# Patient Record
Sex: Female | Born: 1985 | Race: Black or African American | Hispanic: No | Marital: Single | Smoking: Current every day smoker
Health system: Southern US, Community
[De-identification: ages and names within clinical notes are randomized; demographics above are authoritative.]

## PROBLEM LIST (undated history)

## (undated) ENCOUNTER — Inpatient Hospital Stay (HOSPITAL_COMMUNITY): Payer: Self-pay

## (undated) DIAGNOSIS — O24419 Gestational diabetes mellitus in pregnancy, unspecified control: Secondary | ICD-10-CM

## (undated) DIAGNOSIS — O139 Gestational [pregnancy-induced] hypertension without significant proteinuria, unspecified trimester: Secondary | ICD-10-CM

## (undated) DIAGNOSIS — A549 Gonococcal infection, unspecified: Secondary | ICD-10-CM

## (undated) DIAGNOSIS — F419 Anxiety disorder, unspecified: Secondary | ICD-10-CM

## (undated) DIAGNOSIS — I2699 Other pulmonary embolism without acute cor pulmonale: Secondary | ICD-10-CM

## (undated) DIAGNOSIS — F32A Depression, unspecified: Secondary | ICD-10-CM

## (undated) DIAGNOSIS — O0933 Supervision of pregnancy with insufficient antenatal care, third trimester: Secondary | ICD-10-CM

## (undated) DIAGNOSIS — F329 Major depressive disorder, single episode, unspecified: Secondary | ICD-10-CM

## (undated) DIAGNOSIS — A749 Chlamydial infection, unspecified: Secondary | ICD-10-CM

## (undated) HISTORY — PX: BIOPSY BREAST: PRO8

## (undated) HISTORY — PX: BREAST LUMPECTOMY: SHX2

## (undated) HISTORY — PX: WISDOM TOOTH EXTRACTION: SHX21

---
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
## 2003-02-13 ENCOUNTER — Emergency Department (HOSPITAL_COMMUNITY): Admission: EM | Admit: 2003-02-13 | Discharge: 2003-02-13 | Payer: Self-pay | Admitting: *Deleted

## 2003-03-06 ENCOUNTER — Emergency Department (HOSPITAL_COMMUNITY): Admission: EM | Admit: 2003-03-06 | Discharge: 2003-03-06 | Payer: Self-pay | Admitting: Emergency Medicine

## 2003-03-06 ENCOUNTER — Encounter: Payer: Self-pay | Admitting: Emergency Medicine

## 2004-02-08 ENCOUNTER — Emergency Department (HOSPITAL_COMMUNITY): Admission: EM | Admit: 2004-02-08 | Discharge: 2004-02-09 | Payer: Self-pay | Admitting: Emergency Medicine

## 2005-04-10 ENCOUNTER — Emergency Department (HOSPITAL_COMMUNITY): Admission: EM | Admit: 2005-04-10 | Discharge: 2005-04-10 | Payer: Self-pay | Admitting: Emergency Medicine

## 2005-04-12 ENCOUNTER — Emergency Department (HOSPITAL_COMMUNITY): Admission: EM | Admit: 2005-04-12 | Discharge: 2005-04-12 | Payer: Self-pay | Admitting: Emergency Medicine

## 2006-04-12 ENCOUNTER — Emergency Department (HOSPITAL_COMMUNITY): Admission: EM | Admit: 2006-04-12 | Discharge: 2006-04-12 | Payer: Self-pay | Admitting: Emergency Medicine

## 2008-02-02 ENCOUNTER — Emergency Department (HOSPITAL_COMMUNITY): Admission: EM | Admit: 2008-02-02 | Discharge: 2008-02-02 | Payer: Self-pay | Admitting: Emergency Medicine

## 2009-10-15 ENCOUNTER — Emergency Department (HOSPITAL_COMMUNITY): Admission: EM | Admit: 2009-10-15 | Discharge: 2009-10-15 | Payer: Self-pay | Admitting: Emergency Medicine

## 2009-12-12 ENCOUNTER — Inpatient Hospital Stay (HOSPITAL_COMMUNITY): Admission: AD | Admit: 2009-12-12 | Discharge: 2009-12-12 | Payer: Self-pay | Admitting: Obstetrics & Gynecology

## 2010-04-18 ENCOUNTER — Ambulatory Visit (HOSPITAL_COMMUNITY): Admission: RE | Admit: 2010-04-18 | Discharge: 2010-04-18 | Payer: Self-pay | Admitting: Obstetrics & Gynecology

## 2010-05-22 ENCOUNTER — Emergency Department (HOSPITAL_COMMUNITY): Admission: EM | Admit: 2010-05-22 | Discharge: 2010-05-22 | Payer: Self-pay | Admitting: Emergency Medicine

## 2010-07-01 ENCOUNTER — Ambulatory Visit: Payer: Self-pay | Admitting: Nurse Practitioner

## 2010-07-01 ENCOUNTER — Inpatient Hospital Stay (HOSPITAL_COMMUNITY)
Admission: AD | Admit: 2010-07-01 | Discharge: 2010-07-01 | Payer: Self-pay | Source: Home / Self Care | Admitting: Obstetrics and Gynecology

## 2010-08-20 ENCOUNTER — Ambulatory Visit: Payer: Self-pay | Admitting: Family Medicine

## 2010-08-20 ENCOUNTER — Inpatient Hospital Stay (HOSPITAL_COMMUNITY): Admission: AD | Admit: 2010-08-20 | Discharge: 2010-08-20 | Payer: Self-pay | Admitting: Obstetrics & Gynecology

## 2010-10-22 ENCOUNTER — Ambulatory Visit: Payer: Self-pay | Admitting: Advanced Practice Midwife

## 2010-10-22 ENCOUNTER — Inpatient Hospital Stay (HOSPITAL_COMMUNITY): Admission: AD | Admit: 2010-10-22 | Discharge: 2010-10-23 | Payer: Self-pay | Admitting: Obstetrics & Gynecology

## 2010-11-26 ENCOUNTER — Inpatient Hospital Stay (HOSPITAL_COMMUNITY)
Admission: AD | Admit: 2010-11-26 | Discharge: 2010-11-26 | Payer: Self-pay | Source: Home / Self Care | Admitting: Obstetrics and Gynecology

## 2010-11-26 ENCOUNTER — Encounter: Payer: Self-pay | Admitting: Obstetrics and Gynecology

## 2010-11-26 ENCOUNTER — Ambulatory Visit: Payer: Self-pay | Admitting: Obstetrics and Gynecology

## 2010-11-26 LAB — CONVERTED CEMR LAB: GC Probe Amp, Genital: NEGATIVE

## 2010-11-27 ENCOUNTER — Inpatient Hospital Stay (HOSPITAL_COMMUNITY)
Admission: AD | Admit: 2010-11-27 | Discharge: 2010-11-27 | Payer: Self-pay | Source: Home / Self Care | Admitting: Obstetrics and Gynecology

## 2010-11-27 ENCOUNTER — Inpatient Hospital Stay (HOSPITAL_COMMUNITY): Admission: AD | Admit: 2010-11-27 | Discharge: 2010-04-07 | Payer: Self-pay | Admitting: Family Medicine

## 2010-11-27 ENCOUNTER — Inpatient Hospital Stay (HOSPITAL_COMMUNITY): Admission: AD | Admit: 2010-11-27 | Discharge: 2010-04-09 | Payer: Self-pay | Admitting: Obstetrics & Gynecology

## 2010-12-03 ENCOUNTER — Ambulatory Visit: Payer: Self-pay | Admitting: Obstetrics and Gynecology

## 2010-12-03 ENCOUNTER — Encounter: Payer: Self-pay | Admitting: Obstetrics and Gynecology

## 2010-12-03 LAB — CONVERTED CEMR LAB
AST: 22 units/L (ref 0–37)
Albumin: 3.3 g/dL — ABNORMAL LOW (ref 3.5–5.2)
Calcium: 9.7 mg/dL (ref 8.4–10.5)
Chloride: 103 meq/L (ref 96–112)
Glucose, Bld: 77 mg/dL (ref 70–99)
HCT: 39.1 % (ref 36.0–46.0)
MCHC: 33.8 g/dL (ref 30.0–36.0)
Potassium: 4.5 meq/L (ref 3.5–5.3)
Sodium: 137 meq/L (ref 135–145)
Total Protein: 6.3 g/dL (ref 6.0–8.3)
Uric Acid, Serum: 6.1 mg/dL (ref 2.4–7.0)

## 2010-12-05 ENCOUNTER — Encounter (INDEPENDENT_AMBULATORY_CARE_PROVIDER_SITE_OTHER): Payer: Self-pay | Admitting: *Deleted

## 2010-12-05 LAB — CONVERTED CEMR LAB
Collection Interval-CRCL: 24 hr
Creatinine 24 HR UR: 1715 mg/24hr (ref 700–1800)
Creatinine Clearance: 192 mL/min — ABNORMAL HIGH (ref 75–115)
Protein, 24H Urine: 167 mg/24hr — ABNORMAL HIGH (ref 50–100)

## 2010-12-10 ENCOUNTER — Ambulatory Visit: Payer: Self-pay | Admitting: Obstetrics and Gynecology

## 2010-12-10 ENCOUNTER — Encounter: Payer: Self-pay | Admitting: Physician Assistant

## 2010-12-11 ENCOUNTER — Ambulatory Visit (HOSPITAL_COMMUNITY)
Admission: RE | Admit: 2010-12-11 | Discharge: 2010-12-11 | Payer: Self-pay | Source: Home / Self Care | Attending: Obstetrics & Gynecology | Admitting: Obstetrics & Gynecology

## 2010-12-11 ENCOUNTER — Ambulatory Visit: Payer: Self-pay | Admitting: Family Medicine

## 2010-12-12 ENCOUNTER — Ambulatory Visit: Payer: Self-pay | Admitting: Obstetrics and Gynecology

## 2010-12-12 ENCOUNTER — Inpatient Hospital Stay (HOSPITAL_COMMUNITY)
Admission: AD | Admit: 2010-12-12 | Discharge: 2010-12-15 | Payer: Self-pay | Source: Home / Self Care | Attending: Obstetrics & Gynecology | Admitting: Obstetrics & Gynecology

## 2011-01-12 NOTE — Discharge Summary (Addendum)
NAMEJODE, Anne Richardson           ACCOUNT NO.:  [id number]  MEDICAL RECORD NO.:  [id number]          PATIENT TYPE:  INP  LOCATION:  9105                          FACILITY:  [facility]  PHYSICIAN:  Scheryl Darter, MD       DATE OF BIRTH:  1986-12-07  DATE OF ADMISSION:  12/12/2010 DATE OF DISCHARGE:  12/15/2010                              DISCHARGE SUMMARY   ADMISSION DIAGNOSES: 1. Intrauterine pregnancy at 40-1/7 weeks. 2. Premature rupture of membranes at term. 3. Early labor.  DISCHARGE DIAGNOSES: 1. Intrauterine pregnancy at 40-1/7 weeks. 2. Premature rupture of membranes at term. 3. Early labor. 4. Chorioamnionitis. 5. Failure to progress and status post cesarean delivery of a viable     female infant, Apgars 9 and 9, weighing 7 pounds 11 ounces.  HOSPITAL PROCEDURES: 1. Electronic fetal monitoring. 2. Internal fetal monitoring. 3. Pitocin augmentation of labor. 4. Epidural anesthesia. 5. Primary low transverse cesarean section.  HOSPITAL COURSE:  The patient was admitted with premature rupture of membranes at term.  Cervix was 3 cm at admission.  She was managed expectantly and found to also be having some elevated blood pressures 130s to 170s over 70s and 80s.  Labs were done and were normal.  Pitocin augmentation was begun and she progressed to 5 cm but then stopped making progress after that.  She developed a fever with fetal tachycardia and diagnosed with chorioamnionitis.  Ampicillin and gentamicin were started and decision was made at that point to proceed with primary low transverse cesarean section by Dr. Marice Potter, which was performed under epidural anesthesia for a viable female infant, Apgars of 9 and 9, weighing 7 pounds 11 ounces.  EBL was 600 mL.  There were no complications.  She was taken to recovery and then to Mother-Baby Unit where she received routine care.  On postop day #1, she was doing well. She was afebrile.  Incision was clean, dry, and intact.  Pain  was appropriate.  She was passing gas and tolerating food and fluids and increased activity and was initially breast-feeding and then changed over to bottle-feeding when she had trouble breast-feeding.  On postop day #2, she was requesting early discharge.  She had decided to use oral contraceptives for birth control.  She was bottle-feeding her infant. Vital signs were stable.  She was afebrile for greater than 36 hours. Chest was clear.  Heart rate regular rate and rhythm.  Abdomen soft and appropriately tender.  Incision was clean, dry, and intact.  Lochia normal.  Extremities normal.  She was deemed to have received full benefit of her hospital stay and was discharged home.  DISCHARGE MEDICATIONS:  Motrin 600 mg p.o. q.6 h. p.r.n.pain, Percocet 1 to 2 p.o. q.4 h. p.r.n.severe pain, prenatal vitamin one p.o. daily, iron 325 mg p.o. daily, Colace 100 mg p.o. daily p.r.n., Ortho-Novum 1/35 one p.o. daily.  DISCHARGE LABS:  Urine was negative for protein and white blood cell count was 12.3, hemoglobin 13.5, platelets 255.  Urine drug screen was negative.  Uric acid was 6.7.  Chemistries were normal.  RPR was nonreactive.  DISCHARGE INSTRUCTIONS:  Per handout discharge followup in  6 [facility].  CONDITION ON DISCHARGE:  Good.     Anne Richardson, C.N.M.   ______________________________ Scheryl Darter, MD    MLW/MEDQ  D:  12/15/2010  T:  12/16/2010  Job:  [id number]  Electronically Signed by Wynelle Bourgeois C.N.M. on 01/10/2011 03:12:48 PM Electronically Signed by Scheryl Darter MD on 01/12/2011 11:44:12 AM

## 2011-01-15 ENCOUNTER — Ambulatory Visit: Admit: 2011-01-15 | Payer: Self-pay | Admitting: Family Medicine

## 2011-01-26 ENCOUNTER — Ambulatory Visit (INDEPENDENT_AMBULATORY_CARE_PROVIDER_SITE_OTHER): Payer: Medicaid Other | Admitting: Family Medicine

## 2011-01-26 ENCOUNTER — Other Ambulatory Visit: Payer: Self-pay | Admitting: Family Medicine

## 2011-01-26 DIAGNOSIS — F329 Major depressive disorder, single episode, unspecified: Secondary | ICD-10-CM

## 2011-02-16 ENCOUNTER — Ambulatory Visit: Payer: Medicaid Other | Admitting: Family Medicine

## 2011-03-02 LAB — CBC
HCT: 39.3 % (ref 36.0–46.0)
HCT: 39.8 % (ref 36.0–46.0)
MCHC: 34.2 g/dL (ref 30.0–36.0)
RBC: 4.44 MIL/uL (ref 3.87–5.11)
RBC: 4.45 MIL/uL (ref 3.87–5.11)
RDW: 13.9 % (ref 11.5–15.5)
RDW: 14.1 % (ref 11.5–15.5)
WBC: 11.8 10*3/uL — ABNORMAL HIGH (ref 4.0–10.5)
WBC: 12.3 10*3/uL — ABNORMAL HIGH (ref 4.0–10.5)

## 2011-03-02 LAB — POCT URINALYSIS DIPSTICK
Glucose, UA: NEGATIVE mg/dL
Ketones, ur: NEGATIVE mg/dL
Nitrite: NEGATIVE
pH: 5.5 (ref 5.0–8.0)

## 2011-03-02 LAB — COMPREHENSIVE METABOLIC PANEL
ALT: 15 U/L (ref 0–35)
Albumin: 3 g/dL — ABNORMAL LOW (ref 3.5–5.2)
Calcium: 9.6 mg/dL (ref 8.4–10.5)
Creatinine, Ser: 0.72 mg/dL (ref 0.4–1.2)
GFR calc non Af Amer: >60 mL/min (ref >60)
Glucose, Bld: 82 mg/dL (ref 70–99)
Sodium: 134 mEq/L — ABNORMAL LOW (ref 135–145)
Total Protein: 6.7 g/dL (ref 6.0–8.3)

## 2011-03-02 LAB — URINALYSIS, DIPSTICK ONLY
Glucose, UA: NEGATIVE mg/dL
Nitrite: NEGATIVE
Specific Gravity, Urine: 1.025 (ref 1.005–1.030)
pH: 6 (ref 5.0–8.0)

## 2011-03-02 LAB — GLUCOSE, CAPILLARY: Glucose-Capillary: 100 mg/dL — ABNORMAL HIGH (ref 70–99)

## 2011-03-02 LAB — URIC ACID: Uric Acid, Serum: 6.7 mg/dL (ref 2.4–7.0)

## 2011-03-02 LAB — RAPID URINE DRUG SCREEN, HOSP PERFORMED: Cocaine: NOT DETECTED

## 2011-03-03 LAB — COMPREHENSIVE METABOLIC PANEL
ALT: 18 U/L (ref 0–35)
AST: 25 U/L (ref 0–37)
Albumin: 2.7 g/dL — ABNORMAL LOW (ref 3.5–5.2)
Alkaline Phosphatase: 123 U/L — ABNORMAL HIGH (ref 39–117)
BUN: 6 mg/dL (ref 6–23)
CO2: 24 mEq/L (ref 19–32)
Chloride: 104 mEq/L (ref 96–112)
GFR calc Af Amer: >60 mL/min (ref >60)
Glucose, Bld: 158 mg/dL — ABNORMAL HIGH (ref 70–99)
Potassium: 4.1 mEq/L (ref 3.5–5.1)

## 2011-03-03 LAB — URINALYSIS, ROUTINE W REFLEX MICROSCOPIC
Glucose, UA: NEGATIVE mg/dL
Ketones, ur: 15 mg/dL — AB
Nitrite: NEGATIVE
Protein, ur: NEGATIVE mg/dL

## 2011-03-03 LAB — CBC
HCT: 39.1 % (ref 36.0–46.0)
Hemoglobin: 13 g/dL (ref 12.0–15.0)
RBC: 4.18 MIL/uL (ref 3.87–5.11)
RDW: 14.3 % (ref 11.5–15.5)
WBC: 9.8 10*3/uL (ref 4.0–10.5)

## 2011-03-03 LAB — POCT URINALYSIS DIPSTICK
Bilirubin Urine: NEGATIVE
Glucose, UA: NEGATIVE mg/dL
Protein, ur: 30 mg/dL — AB
Urobilinogen, UA: 0.2 mg/dL (ref 0.0–1.0)
pH: 5.5 (ref 5.0–8.0)

## 2011-03-03 LAB — PROTEIN, URINE, 24 HOUR: Protein, Urine: 8 mg/dL

## 2011-03-03 LAB — WET PREP, GENITAL

## 2011-03-03 LAB — CREATININE, URINE, 24 HOUR: Collection Interval-UCRE24: 24 hours

## 2011-03-03 LAB — HIV ANTIBODY (ROUTINE TESTING W REFLEX): HIV: NONREACTIVE

## 2011-03-03 LAB — PROTEIN / CREATININE RATIO, URINE: Protein Creatinine Ratio: 0.19 — ABNORMAL HIGH (ref 0.00–0.15)

## 2011-03-06 LAB — DIFFERENTIAL
Basophils Relative: 0 % (ref 0–1)
Lymphocytes Relative: 16 % (ref 12–46)
Monocytes Absolute: 1.2 10*3/uL — ABNORMAL HIGH (ref 0.1–1.0)
Monocytes Relative: 11 % (ref 3–12)
Neutro Abs: 8.1 10*3/uL — ABNORMAL HIGH (ref 1.7–7.7)

## 2011-03-06 LAB — URINALYSIS, ROUTINE W REFLEX MICROSCOPIC
Glucose, UA: NEGATIVE mg/dL
Nitrite: NEGATIVE
Protein, ur: NEGATIVE mg/dL

## 2011-03-06 LAB — URINE MICROSCOPIC-ADD ON

## 2011-03-06 LAB — WET PREP, GENITAL

## 2011-03-06 LAB — RUBELLA SCREEN: Rubella: 56.5 IU/mL — ABNORMAL HIGH

## 2011-03-06 LAB — RPR: RPR Ser Ql: NONREACTIVE

## 2011-03-06 LAB — TYPE AND SCREEN
ABO/RH(D): B POS
Antibody Screen: NEGATIVE

## 2011-03-06 LAB — CBC
HCT: 36.8 % (ref 36.0–46.0)
Hemoglobin: 12.5 g/dL (ref 12.0–15.0)
MCHC: 34.1 g/dL (ref 30.0–36.0)

## 2011-03-10 LAB — HCG, QUANTITATIVE, PREGNANCY: hCG, Beta Chain, Quant, S: 3036 m[IU]/mL — ABNORMAL HIGH (ref <5)

## 2011-03-10 LAB — WET PREP, GENITAL: Yeast Wet Prep HPF POC: NONE SEEN

## 2011-03-10 LAB — URINALYSIS, ROUTINE W REFLEX MICROSCOPIC
Bilirubin Urine: NEGATIVE
Glucose, UA: NEGATIVE mg/dL
Hgb urine dipstick: NEGATIVE
Ketones, ur: NEGATIVE mg/dL
pH: 6 (ref 5.0–8.0)

## 2011-03-10 LAB — CBC
Platelets: 274 10*3/uL (ref 150–400)
RDW: 12.6 % (ref 11.5–15.5)

## 2011-03-10 LAB — GC/CHLAMYDIA PROBE AMP, GENITAL: Chlamydia, DNA Probe: NEGATIVE

## 2011-03-13 NOTE — Progress Notes (Signed)
Anne Richardson, Anne Richardson           ACCOUNT NO.:  [id number]  MEDICAL RECORD NO.:  [id number]           PATIENT TYPE:  A  LOCATION:  [facility]                   FACILITY:  [facility]  PHYSICIAN:  Maryelizabeth Kaufmann, MD  DATE OF BIRTH:  [birth year]  DATE OF SERVICE:  01/26/2011                                 CLINIC NOTE  CHIEF COMPLAINT:  Postpartum check.  HISTORY OF PRESENT ILLNESS:  This is a 25 year old gravida 2, para 1-0-1- 1 status post primary low transverse cesarean section for failure to progress presenting for her postpartum evaluation.  She states she does have some mild incisional pain on the right; otherwise, she denies any problems with her incision.  No opening.  She denies any fevers, no chills, no nausea, no vomiting.  She is currently bottle feeding.  She is currently on OCPs and condoms for birth control.  She has returned to her sexual activity; however, she does state she is having difficulty remembering to take her OCPs regularly, so she is considering other forms of contraception including the IUD, which she is unsure if she wants to do that at the time.  She denies any constipation.  Her last menstrual period is as of January 04, 2011.  She does have some mild spotting as of currently.  Additionally of note, she declines any signs or symptoms of depression; however, upon further questioning, her postpartum screening tool was positive with a score of 16.  She denies any suicidal ideation.  She denies any inclination to hurt the baby; however, she does state that she have thoughts at times of hurting the baby's daddy and she state that she sometimes desires to "stab him." She denies any history of previous depression.  PHYSICAL EXAMINATION:  VITAL SIGNS:  Blood pressure 122/79, pulse 88, temperature 96.9, weight 257.4, height 66 inches. GENERAL:  The patient in no acute distress, alert and oriented x4. CARDIOVASCULAR:  Regular rate and rhythm.  No murmurs, rubs, or  gallops. CHEST:  Clear to auscultation bilaterally.  No wheezing, rales, or rhonchi. BREASTS:  Soft, no masses, no lesions, no engorgement, no axillary lymphadenopathy bilaterally. ABDOMEN:  Positive bowel sounds, soft, nontender, nondistended. Positive for obesity.  Incision is otherwise clean, dry, and intact and well healed. GU:  Normal external genitalia.  Normal vaginal mucosa, pink rugae. Cervix appears closed.  No bleeds were visualized.  We did do a Pap smear because she did not have one intrapartum.  Bimanual exam, anteverted uterus, mobile, nongravid.  No adnexal tenderness to palpation bilaterally.  ASSESSMENT AND PLAN:  This is a 25 year old gravida 2, para 1-0-1-1 status post primary low transverse cesarean section for failure to progress presenting for her postpartum check. 1. Postpartum contraception.  The patient will continue on OCPs until     she has finalized on her decision.  We will have the patient to     follow up for postpartum contraceptive at her will.  She does have     appropriate refills for a year for her OCPs. 2. Postpartum depression.  I did start the patient on Celexa 20 mg one     tab p.o. daily.  I did discuss with  the patient the importance of     safety and to call 911 if she truly have thoughts of wanting to     harm herself, the baby, or anyone else.  She did express     understanding in that regard and she agreed for a safety contract     and agreed to     call 911 if she feels that she was in any way a danger to herself     or to others.  We will have the patient return to clinic in 3 weeks     to follow up on her postpartum depression.          ______________________________ Maryelizabeth Kaufmann, MD    LC/MEDQ  D:  01/26/2011  T:  01/27/2011  Job:  [id number]

## 2011-03-23 LAB — CBC
HCT: 40.1 % (ref 36.0–46.0)
Platelets: 285 10*3/uL (ref 150–400)
RDW: 12.7 % (ref 11.5–15.5)

## 2011-03-23 LAB — WET PREP, GENITAL: Clue Cells Wet Prep HPF POC: NONE SEEN

## 2011-03-23 LAB — URINALYSIS, ROUTINE W REFLEX MICROSCOPIC
Leukocytes, UA: NEGATIVE
Nitrite: POSITIVE — AB
Specific Gravity, Urine: >1.03 — ABNORMAL HIGH (ref 1.005–1.030)
Urobilinogen, UA: 1 mg/dL (ref 0.0–1.0)

## 2011-03-23 LAB — GC/CHLAMYDIA PROBE AMP, GENITAL: GC Probe Amp, Genital: NEGATIVE

## 2011-03-23 LAB — URINE MICROSCOPIC-ADD ON

## 2011-03-23 LAB — POCT PREGNANCY, URINE: Preg Test, Ur: POSITIVE

## 2011-09-23 ENCOUNTER — Emergency Department (HOSPITAL_COMMUNITY): Payer: Self-pay

## 2011-09-23 ENCOUNTER — Emergency Department (HOSPITAL_COMMUNITY)
Admission: EM | Admit: 2011-09-23 | Discharge: 2011-09-23 | Disposition: A | Discharge status: Home / Self Care | Payer: Self-pay | Attending: Emergency Medicine | Admitting: Emergency Medicine

## 2011-09-23 DIAGNOSIS — M549 Dorsalgia, unspecified: Secondary | ICD-10-CM | POA: Insufficient documentation

## 2011-09-23 DIAGNOSIS — R51 Headache: Secondary | ICD-10-CM | POA: Insufficient documentation

## 2011-09-23 DIAGNOSIS — R059 Cough, unspecified: Secondary | ICD-10-CM | POA: Insufficient documentation

## 2011-09-23 DIAGNOSIS — J069 Acute upper respiratory infection, unspecified: Secondary | ICD-10-CM | POA: Insufficient documentation

## 2011-09-23 DIAGNOSIS — R05 Cough: Secondary | ICD-10-CM | POA: Insufficient documentation

## 2011-11-11 ENCOUNTER — Ambulatory Visit (INDEPENDENT_AMBULATORY_CARE_PROVIDER_SITE_OTHER): Payer: Self-pay | Admitting: Family Medicine

## 2011-11-11 ENCOUNTER — Encounter: Payer: Self-pay | Admitting: Family Medicine

## 2011-11-11 DIAGNOSIS — Z23 Encounter for immunization: Secondary | ICD-10-CM

## 2011-11-11 DIAGNOSIS — Z124 Encounter for screening for malignant neoplasm of cervix: Secondary | ICD-10-CM

## 2011-11-11 DIAGNOSIS — N912 Amenorrhea, unspecified: Secondary | ICD-10-CM

## 2011-11-11 DIAGNOSIS — Z3201 Encounter for pregnancy test, result positive: Secondary | ICD-10-CM | POA: Insufficient documentation

## 2011-11-11 MED ORDER — BL PRENATAL VITAMINS PO TABS: 1.0000 | ORAL_TABLET | Freq: Every day | ORAL | Status: DC

## 2011-11-11 NOTE — Progress Notes (Signed)
  Subjective:    Patient ID: Anne Richardson, female    DOB: 09-May-1986, 25 y.o.   MRN: [id number]  HPI New patient here to discuss contraception.  Notes she has not had a period in 2 months.  Positive pregnancy test here in office  G3P1- last baby was c-section in Dec 2011 for maternal fever.  Had a miscarriage prior to that.  LMP ? August 31st.  Not using contraception. not sure of dates.  Not taking PNV.  Notes fatigue, frequent urination.  Unsure if she would like to continue with pregnancy.  Would like to speak with her boyfriend.  Review of [employer]:  No fever, chills, unexplained weight loss CV:  No chest pain, palpitations Resp: No cough, dyspnea, wheezing Abd: No nausea, vomting, diarrhea, constipation GU: No dysuria,vaginal discharge Neuro:  No headache, numbness, weakness       Objective:   Physical Exam GEN: Alert & Oriented, No acute distress CV:  Regular Rate & Rhythm, no murmur Respiratory:  Normal work of breathing, CTAB Abd:  + BS, soft, no tenderness to palpation.  Obese.  Unable to doppler FHT.  Unterus not palpated externally. Ext: no pre-tibial edema        Assessment & Plan:

## 2011-11-11 NOTE — Assessment & Plan Note (Signed)
Gave her info for termination as she is undecided.  Will check ultrasound as dating unsure.  We discussed risk factors of marijuana smoking.  Will start prenatal vitamins.  She would like first trimester screening if possible, We will discuss if we will start prenatal care by phone after dating determined and she has decided if she will continue.  Flu vaccine given today.

## 2011-11-11 NOTE — Patient Instructions (Addendum)
Sign up for medicaid Make lab apppintment for new OB appointment and labwork  ABCs of Pregnancy A Antepartum care is very important. Be sure you see your doctor and get prenatal care as soon as you think you are pregnant. At this time, you will be tested for infection, genetic abnormalities and potential problems with you and the pregnancy. This is the time to discuss diet, exercise, work, medications, labor, pain medication during labor and the possibility of a cesarean delivery. Ask any questions that may concern you. It is important to see your doctor regularly throughout your pregnancy. Avoid exposure to toxic substances and chemicals - such as cleaning solvents, lead and mercury, some insecticides, and paint. Pregnant women should avoid exposure to paint fumes, and fumes that cause you to feel ill, dizzy or faint. When possible, it is a good idea to have a pre-pregnancy consultation with your caregiver to begin some important recommendations your caregiver suggests such as, taking folic acid, exercising, quitting smoking, avoiding alcoholic beverages, etc. B Breastfeeding is the healthiest choice for both you and your baby. It has many nutritional benefits for the baby and health benefits for the mother. It also creates a very tight and loving bond between the baby and mother. Talk to your doctor, your family and friends, and your employer about how you choose to feed your baby and how they can support you in your decision. Not all birth defects can be prevented, but a woman can take actions that may increase her chance of having a healthy baby. Many birth defects happen very early in pregnancy, sometimes before a woman even knows she is pregnant. Birth defects or abnormalities of any child in your or the father's family should be discussed with your caregiver. Get a good support bra as your breast size changes. Wear it especially when you exercise and when nursing.   C Celebrate the news of your  pregnancy with the your spouse/father and family. Childbirth classes are helpful to take for you and the spouse/father because it helps to understand what happens during the pregnancy, labor and delivery. Cesarean delivery should be discussed with your doctor so you are prepared for that possibility. The pros and cons of circumcision if it is a boy, should be discussed with your pediatrician. Cigarette smoking during pregnancy can result in low birth weight babies. It has been associated with infertility, miscarriages, tubal pregnancies, infant death (mortality) and poor health (morbidity) in childhood. Additionally, cigarette smoking may cause long-term learning disabilities. If you smoke, you should try to quit before getting pregnant and not smoke during the pregnancy. Secondary smoke may also harm a mother and her developing baby. It is a good idea to ask people to stop smoking around you during your pregnancy and after the baby is born. Extra calcium is necessary when you are pregnant and is found in your prenatal vitamin, in dairy products, green leafy vegetables and in calcium supplements. D A healthy diet according to your current weight and height, along with vitamins and mineral supplements should be discussed with your caregiver. Domestic abuse or violence should be made known to your doctor right away to get the situation corrected. Drink more water when you exercise to keep hydrated. Discomfort of your back and legs usually develops and progresses from the middle of the second trimester through to delivery of the baby. This is because of the enlarging baby and uterus, which may also affect your balance. Do not take illegal drugs. Illegal drugs can seriously  harm the baby and you. Drink extra fluids (water is best) throughout pregnancy to help your body keep up with the increases in your blood volume. Drink at least 6 to 8 glasses of water, fruit juice, or milk each day. A good way to know you are  drinking enough fluid is when your urine looks almost like clear water or is very light yellow.   E Eat healthy to get the nutrients you and your unborn baby need. Your meals should include the five basic food groups. Exercise (30 minutes of light to moderate exercise a day) is important and encouraged during pregnancy, if there are no medical problems or problems with the pregnancy. Exercise that causes discomfort or dizziness should be stopped and reported to your caregiver. Emotions during pregnancy can change from being ecstatic to depression and should be understood by you, your partner and your family. F Fetal screening with ultrasound, amniocentesis and monitoring during pregnancy and labor is common and sometimes necessary. Take 400 micrograms of folic acid daily both before, when possible, and during the first few months of pregnancy to reduce the risk of birth defects of the brain and spine. All women who could possibly become pregnant should take a vitamin with folic acid, every day. It is also important to eat a healthy diet with fortified foods (enriched grain products, including cereals, rice, breads, and pastas) and foods with natural sources of folate (orange juice, green leafy vegetables, beans, peanuts, broccoli, asparagus, peas, and lentils). The father should be involved with all aspects of the pregnancy including, the prenatal care, childbirth classes, labor, delivery, and postpartum time. Fathers may also have emotional concerns about being a father, financial needs, and raising a family. G Genetic testing should be done appropriately. It is important to know your family and the father's history. If there have been problems with pregnancies or birth defects in your family, report these to your doctor. Also, genetic counselors can talk with you about the information you might need in making decisions about having a family. You can call a major medical center in your area for help in  finding a board-certified genetic counselor. Genetic testing and counseling should be done before pregnancy when possible, especially if there is a history of problems in the mother's or father's family. Certain ethnic backgrounds are more at risk for genetic defects. H Get familiar with the hospital where you will be having your baby. Get to know how long it takes to get there, the labor and delivery area, and the hospital procedures. Be sure your medical insurance is accepted there. Get your home ready for the baby including, clothes, the baby's room (when possible), furniture and car seat. Hand washing is important throughout the day, especially after handling raw meat and poultry, changing the baby's diaper or using the bathroom. This can help prevent the spread of many bacteria and viruses that cause infection. Your hair may become dry and thinner, but will return to normal a few weeks after the baby is born. Heartburn is a common problem that can be treated by taking antacids recommended by your caregiver, eating smaller meals 5 or 6 times a day, not drinking liquids when eating, drinking between meals and raising the head of your bed 2 to 3 inches. I Insurance to cover you, the baby, doctor and hospital should be reviewed so that you will be prepared to pay any costs not covered by your insurance plan. If you do not have medical insurance, there are  usually clinics and services available for you in your community. Take 30 milligrams of iron during your pregnancy as prescribed by your doctor to reduce the risk of low red blood cells (anemia) later in pregnancy. All women of childbearing age should eat a diet rich in iron. J There should be a joint effort for the mother, father and any other children to adapt to the pregnancy financially, emotionally, and psychologically during the pregnancy. Join a support group for moms-to-be. Or, join a class on parenting or childbirth. Have the family participate when  possible. K Know your limits. Let your caregiver know if you experience any of the following:    Pain of any kind.     Strong cramps.     You develop a lot of weight in a short period of time (5 pounds in 3 to 5 days).     Vaginal bleeding, leaking of amniotic fluid.     Headache, vision problems.     Dizziness, fainting, shortness of breath.     Chest pain.     Fever of 102 F (38.9 C) or higher.     Gush of clear fluid from your vagina.     Painful urination.     Domestic violence.     Irregular heartbeat (palpitations).     Rapid beating of the heart (tachycardia).     Constant feeling sick to your stomach (nauseous) and vomiting.     Trouble walking, fluid retention (edema).     Muscle weakness.     If your baby has decreased activity.     Persistent diarrhea.     Abnormal vaginal discharge.     Uterine contractions at 20-minute intervals.     Back pain that travels down your leg.  L Learn and practice that what you eat and drink should be in moderation and healthy for you and your baby. Legal drugs such as alcohol and caffeine are important issues for pregnant women. There is no safe amount of alcohol a woman can drink while pregnant. Fetal alcohol syndrome, a disorder characterized by growth retardation, facial abnormalities, and central nervous system dysfunction, is caused by a woman's use of alcohol during pregnancy. Caffeine, found in tea, coffee, soft drinks and chocolate, should also be limited. Be sure to read labels when trying to cut down on caffeine during pregnancy. More than 200 foods, beverages, and over-the-counter medications contain caffeine and have a high salt content! There are coffees and teas that do not contain caffeine. M Medical conditions such as diabetes, epilepsy, and high blood pressure should be treated and kept under control before pregnancy when possible, but especially during pregnancy. Ask your caregiver about any medications  that may need to be changed or adjusted during pregnancy. If you are currently taking any medications, ask your caregiver if it is safe to take them while you are pregnant or before getting pregnant when possible. Also, be sure to discuss any herbs or vitamins you are taking. They are medicines, too! Discuss with your doctor all medications, prescribed and over-the-counter, that you are taking. During your prenatal visit, discuss the medications your doctor may give you during labor and delivery. N Never be afraid to ask your doctor or caregiver questions about your health, the progress of the pregnancy, family problems, stressful situations, and recommendation for a pediatrician, if you do not have one. It is better to take all precautions and discuss any questions or concerns you may have during your office visits. It is  a good idea to write down your questions before you visit the doctor. O Over-the-counter cough and cold remedies may contain alcohol or other ingredients that should be avoided during pregnancy. Ask your caregiver about prescription, herbs or over-the-counter medications that you are taking or may consider taking while pregnant.   P Physical activity during pregnancy can benefit both you and your baby by lessening discomfort and fatigue, providing a sense of well-being, and increasing the likelihood of early recovery after delivery. Light to moderate exercise during pregnancy strengthens the belly (abdominal) and back muscles. This helps improve posture. Practicing yoga, walking, swimming, and cycling on a stationary bicycle are usually safe exercises for pregnant women. Avoid scuba diving, exercise at high altitudes (over 3000 feet), skiing, horseback riding, contact sports, etc. Always check with your doctor before beginning any kind of exercise, especially during pregnancy and especially if you did not exercise before getting pregnant. Q Queasiness, stomach upset and morning sickness  are common during pregnancy. Eating a couple of crackers or dry toast before getting out of bed. Foods that you normally love may make you feel sick to your stomach. You may need to substitute other nutritious foods. Eating 5 or 6 small meals a day instead of 3 large ones may make you feel better. Do not drink with your meals, drink between meals. Questions that you have should be written down and asked during your prenatal visits. R Read about and make plans to baby-proof your home. There are important tips for making your home a safer environment for your baby. Review the tips and make your home safer for you and your baby. Read food labels regarding calories, salt and fat content in the food. S Saunas, hot tubs, and steam rooms should be avoided while you are pregnant. Excessive high heat may be harmful during your pregnancy. Your caregiver will screen and examine you for sexually transmitted diseases and genetic disorders during your prenatal visits. Learn the signs of labor. Sexual relations while pregnant is safe unless there is a medical or pregnancy problem and your caregiver advises against it. T Traveling long distances should be avoided especially in the third trimester of your pregnancy. If you do have to travel out of state, be sure to take a copy of your medical records and medical insurance plan with you. You should not travel long distances without seeing your doctor first. Most airlines will not allow you to travel after 36 weeks of pregnancy. Toxoplasmosis is an infection caused by a parasite that can seriously harm an unborn baby. Avoid eating undercooked meat and handling cat litter. Be sure to wear gloves when gardening. Tingling of the hands and fingers is not unusual and is due to fluid retention. This will go away after the baby is born. U Womb (uterus) size increases during the first trimester. Your kidneys will begin to function more efficiently. This may cause you to feel the need  to urinate more often. You may also leak urine when sneezing, coughing or laughing. This is due to the growing uterus pressing against your bladder, which lies directly in front of and slightly under the uterus during the first few months of pregnancy. If you experience burning along with frequency of urination or bloody urine, be sure to tell your doctor. The size of your uterus in the third trimester may cause a problem with your balance. It is advisable to maintain good posture and avoid wearing high heels during this time. An ultrasound of your baby  may be necessary during your pregnancy and is safe for you and your baby. V Vaccinations are an important concern for pregnant women. Get needed vaccines before pregnancy. Center for Disease Control ([employer]) has clear guidelines for the use of vaccines during pregnancy. Review the list, be sure to discuss it with your doctor. Prenatal vitamins are helpful and healthy for you and the baby. Do not take extra vitamins except what is recommended. Taking too much of certain vitamins can cause overdose problems. Continuous vomiting should be reported to your caregiver. Varicose veins may appear especially if there is a family history of varicose veins. They should subside after the delivery of the baby. Support hose helps if there is leg discomfort. W Being overweight or underweight during pregnancy may cause problems. Try to get within 15 pounds of your ideal weight before pregnancy. Remember, pregnancy is not a time to be dieting! Do not stop eating or start skipping meals as your weight increases. Both you and your baby need the calories and nutrition you receive from a healthy diet. Be sure to consult with your doctor about your diet. There is a formula and diet plan available depending on whether you are overweight or underweight. Your caregiver or nutritionist can help and advise you if necessary. X Avoid X-rays. If you must have dental work or diagnostic  tests, tell your dentist or physician that you are pregnant so that extra care can be taken. X-rays should only be taken when the risks of not taking them outweigh the risk of taking them. If needed, only the minimum amount of radiation should be used. When X-rays are necessary, protective lead shields should be used to cover areas of the body that are not being X-rayed. Y Your baby loves you. Breastfeeding your baby creates a loving and very close bond between the two of you. Give your baby a healthy environment to live in while you are pregnant. Infants and children require constant care and guidance. Their health and safety should be carefully watched at all times. After the baby is born, rest or take a nap when the baby is sleeping. Z Get your ZZZs. Be sure to get plenty of rest. Resting on your side as often as possible, especially on your left side is advised. It provides the best circulation to your baby and helps reduce swelling. Try taking a nap for 30 to 45 minutes in the afternoon when possible. After the baby is born rest or take a nap when the baby is sleeping. Try elevating your feet for that amount of time when possible. It helps the circulation in your legs and helps reduce swelling.   Most information courtesy of the CDC. Document Released: 12/07/2005 Document Revised: 08/19/2011 Document Reviewed: 08/21/2009 [facility] Patient Information 2012 [place], [place].

## 2011-11-13 ENCOUNTER — Ambulatory Visit (HOSPITAL_COMMUNITY)
Admission: RE | Admit: 2011-11-13 | Discharge: 2011-11-13 | Disposition: A | Discharge status: Home / Self Care | Payer: Medicaid Other | Source: Ambulatory Visit | Attending: Family Medicine | Admitting: Family Medicine

## 2011-11-13 DIAGNOSIS — Z3201 Encounter for pregnancy test, result positive: Secondary | ICD-10-CM

## 2011-11-13 DIAGNOSIS — Z3689 Encounter for other specified antenatal screening: Secondary | ICD-10-CM | POA: Insufficient documentation

## 2011-11-13 DIAGNOSIS — O99891 Other specified diseases and conditions complicating pregnancy: Secondary | ICD-10-CM | POA: Insufficient documentation

## 2011-11-16 ENCOUNTER — Telehealth: Payer: Self-pay | Admitting: Family Medicine

## 2011-11-16 DIAGNOSIS — Z3201 Encounter for pregnancy test, result positive: Secondary | ICD-10-CM

## 2011-11-17 NOTE — Telephone Encounter (Signed)
Patient called back- I made her aware of difficulty contacting her.  She states the primary number is correct.  We discussed EDC- she plans on continuing pregnancy care with [place].  Today would be 6 weeks 5 days.  I will fwd to Lupita Leash to get her an assigned OB provider.

## 2011-11-17 NOTE — Telephone Encounter (Signed)
NOB appointments scheduled as follows:  Lab 12/02/11 @ 2pm.  Dr. Clinton Sawyer 12/09/11 @ 2pm.  Will attempt to call patient with appointments.  I will also mail a letter.  Ileana Ladd

## 2011-11-17 NOTE — Telephone Encounter (Signed)
Unable to contact patient or any of her contact numbers.  Will send letter.

## 2011-11-30 ENCOUNTER — Inpatient Hospital Stay (HOSPITAL_COMMUNITY)
Admission: AD | Admit: 2011-11-30 | Discharge: 2011-11-30 | Disposition: A | Discharge status: Home / Self Care | Payer: Self-pay | Source: Ambulatory Visit | Attending: Obstetrics & Gynecology | Admitting: Obstetrics & Gynecology

## 2011-11-30 ENCOUNTER — Encounter (HOSPITAL_COMMUNITY): Payer: Self-pay

## 2011-11-30 DIAGNOSIS — O219 Vomiting of pregnancy, unspecified: Secondary | ICD-10-CM

## 2011-11-30 DIAGNOSIS — O210 Mild hyperemesis gravidarum: Secondary | ICD-10-CM | POA: Insufficient documentation

## 2011-11-30 LAB — URINALYSIS, ROUTINE W REFLEX MICROSCOPIC
Bilirubin Urine: NEGATIVE
Hgb urine dipstick: NEGATIVE
Ketones, ur: NEGATIVE mg/dL
Specific Gravity, Urine: >1.03 — ABNORMAL HIGH (ref 1.005–1.030)
Urobilinogen, UA: 0.2 mg/dL (ref 0.0–1.0)
pH: 6 (ref 5.0–8.0)

## 2011-11-30 MED ORDER — ONDANSETRON 8 MG PO TBDP: 8.0000 mg | ORAL_TABLET | Freq: Three times a day (TID) | ORAL | Status: AC | PRN

## 2011-11-30 MED ORDER — DEXTROSE 5 % IN LACTATED RINGERS IV BOLUS
1000.0000 mL | Freq: Once | INTRAVENOUS | Status: AC
  Administered 2011-11-30: 1000 mL via INTRAVENOUS

## 2011-11-30 MED ORDER — ONDANSETRON HCL 4 MG/2ML IJ SOLN
4.0000 mg | Freq: Once | INTRAMUSCULAR | Status: AC
  Administered 2011-11-30: 4 mg via INTRAVENOUS
  Filled 2011-11-30: qty 2

## 2011-11-30 MED ORDER — PROMETHAZINE HCL 25 MG/ML IJ SOLN
25.0000 mg | Freq: Four times a day (QID) | INTRAMUSCULAR | Status: DC | PRN
  Administered 2011-11-30: 25 mg via INTRAVENOUS
  Filled 2011-11-30: qty 1

## 2011-11-30 MED ORDER — PROMETHAZINE HCL 25 MG PO TABS: 25.0000 mg | ORAL_TABLET | Freq: Four times a day (QID) | ORAL | Status: DC | PRN

## 2011-11-30 NOTE — ED Provider Notes (Signed)
History     Chief Complaint  Patient presents with  . Emesis   HPI 25 y.o. G3P1011 at [redacted]w[redacted]d c/o n/v x 1 week, unable to keep anything down. Has appt at [facility] on 12/18.     Past Medical History  Diagnosis Date  . No pertinent past medical history     Past Surgical History  Procedure Date  . Cesarean section   . Breast lumpectomy     left  . Biopsy breast     Family History  Problem Relation Age of Onset  . Cancer Mother 29    breast  . Alcohol abuse Mother   . Depression Mother   . Hypertension Mother   . Depression Sister   . Hypertension Sister   . Asthma Brother   . Depression Brother   . Hypertension Brother   . Alcohol abuse Maternal Aunt   . Hypertension Maternal Aunt   . Cancer Maternal Aunt 65    breast ca  . Alcohol abuse Maternal Uncle   . Hypertension Maternal Uncle   . Alzheimer's disease Maternal Grandmother   . Heart disease Maternal Grandmother   . Alcohol abuse Maternal Grandmother   . Hypertension Maternal Grandmother   . Heart disease Maternal Grandfather   . Alcohol abuse Maternal Grandfather   . Hypertension Maternal Grandfather   . Cancer Maternal Grandfather     prostate and bone  . Depression Paternal Grandfather     History  Substance Use Topics  . Smoking status: Former Games developer  . Smokeless tobacco: Not on file  . Alcohol Use: No    Allergies: No Known Allergies  No prescriptions prior to admission    Review of Systems  Constitutional: Negative.   Respiratory: Negative.   Cardiovascular: Negative.   Gastrointestinal: Positive for nausea and vomiting. Negative for abdominal pain, diarrhea and constipation.  Genitourinary: Negative for dysuria, urgency, frequency, hematuria and flank pain.       Negative for vaginal bleeding, vaginal discharge, dyspareunia  Musculoskeletal: Negative.   Neurological: Negative.   Psychiatric/Behavioral: Negative.    Physical Exam   Blood pressure 118/60, pulse 73, temperature 98.4 F  (36.9 C), temperature source Oral, resp. rate 18, height 5\' 5"  (1.651 m), weight 264 lb 12.8 oz (120.112 kg), SpO2 99.00%.  Physical Exam  Nursing note and vitals reviewed. Constitutional: She is oriented to person, place, and time. She appears well-developed and well-nourished.  Cardiovascular: Normal rate.   Respiratory: Effort normal.  GI: Soft. There is no tenderness.  Musculoskeletal: Normal range of motion.  Neurological: She is alert and oriented to person, place, and time.  Skin: Skin is warm and dry.  Psychiatric: She has a normal mood and affect.    MAU Course  Procedures  Results for orders placed during the hospital encounter of 11/30/11 (from the past 24 hour(s))  URINALYSIS, ROUTINE W REFLEX MICROSCOPIC     Status: Abnormal   Collection Time   11/30/11  2:00 PM      Component Value Range   Color, Urine YELLOW  YELLOW    APPearance HAZY (*) CLEAR    Specific Gravity, Urine >1.030 (*) 1.005 - 1.030    pH 6.0  5.0 - 8.0    Glucose, UA NEGATIVE  NEGATIVE (mg/dL)   Hgb urine dipstick NEGATIVE  NEGATIVE    Bilirubin Urine NEGATIVE  NEGATIVE    Ketones, ur NEGATIVE  NEGATIVE (mg/dL)   Protein, ur NEGATIVE  NEGATIVE (mg/dL)   Urobilinogen, UA 0.2  0.0 -  1.0 (mg/dL)   Nitrite NEGATIVE  NEGATIVE    Leukocytes, UA NEGATIVE  NEGATIVE   POCT PREGNANCY, URINE     Status: Normal   Collection Time   11/30/11  2:49 PM      Component Value Range   Preg Test, Ur POSITIVE      IV hydration and Zofran, still c/o nausea following Zofran, Phenergan IV ordered  After Phenergan, tolerating po liquids, ready to go home   Assessment and Plan  25 y.o. G3P1011 at [redacted]w[redacted]d Nausea and vomiting of pregnancy Rx Zofran ODT and Phenergan F/U as scheduled at [facility]  Sherolyn Trettin 11/30/2011, 2:33 PM

## 2011-11-30 NOTE — Progress Notes (Signed)
Patient states she has had nausea and vomiting since 12-6, now unable to keep anything down. Has a slight low back ache.

## 2011-12-02 ENCOUNTER — Other Ambulatory Visit: Payer: Medicaid Other

## 2011-12-04 ENCOUNTER — Other Ambulatory Visit: Payer: Self-pay

## 2011-12-04 DIAGNOSIS — Z331 Pregnant state, incidental: Secondary | ICD-10-CM

## 2011-12-04 NOTE — Progress Notes (Signed)
Prenatal labs done today Anne Richardson 

## 2011-12-05 LAB — OBSTETRIC PANEL
Antibody Screen: NEGATIVE
Eosinophils Relative: 3 % (ref 0–5)
HCT: 41.5 % (ref 36.0–46.0)
Lymphocytes Relative: 25 % (ref 12–46)
Lymphs Abs: 2.9 10*3/uL (ref 0.7–4.0)
MCH: 29.1 pg (ref 26.0–34.0)
MCV: 88.1 fL (ref 78.0–100.0)
Monocytes Absolute: 1 10*3/uL (ref 0.1–1.0)
RBC: 4.71 MIL/uL (ref 3.87–5.11)
Rh Type: POSITIVE
Rubella: 59.9 IU/mL — ABNORMAL HIGH
WBC: 11.8 10*3/uL — ABNORMAL HIGH (ref 4.0–10.5)

## 2011-12-05 LAB — HIV ANTIBODY (ROUTINE TESTING W REFLEX): HIV: NONREACTIVE

## 2011-12-09 ENCOUNTER — Other Ambulatory Visit (HOSPITAL_COMMUNITY)
Admission: RE | Admit: 2011-12-09 | Discharge: 2011-12-09 | Disposition: A | Discharge status: Home / Self Care | Payer: Self-pay | Source: Ambulatory Visit | Attending: Family Medicine | Admitting: Family Medicine

## 2011-12-09 ENCOUNTER — Ambulatory Visit (INDEPENDENT_AMBULATORY_CARE_PROVIDER_SITE_OTHER): Payer: Self-pay | Admitting: Family Medicine

## 2011-12-09 VITALS — BP 116/80 | Temp 98.2°F | Wt 255.1 lb

## 2011-12-09 DIAGNOSIS — Z124 Encounter for screening for malignant neoplasm of cervix: Secondary | ICD-10-CM

## 2011-12-09 DIAGNOSIS — R110 Nausea: Secondary | ICD-10-CM

## 2011-12-09 DIAGNOSIS — Z331 Pregnant state, incidental: Secondary | ICD-10-CM

## 2011-12-09 DIAGNOSIS — Z9889 Other specified postprocedural states: Secondary | ICD-10-CM

## 2011-12-09 DIAGNOSIS — Z98891 History of uterine scar from previous surgery: Secondary | ICD-10-CM

## 2011-12-09 DIAGNOSIS — Z01419 Encounter for gynecological examination (general) (routine) without abnormal findings: Secondary | ICD-10-CM | POA: Insufficient documentation

## 2011-12-09 DIAGNOSIS — O210 Mild hyperemesis gravidarum: Secondary | ICD-10-CM | POA: Insufficient documentation

## 2011-12-09 DIAGNOSIS — O09899 Supervision of other high risk pregnancies, unspecified trimester: Secondary | ICD-10-CM

## 2011-12-09 LAB — POCT WET PREP (WET MOUNT)

## 2011-12-09 MED ORDER — PROMETHAZINE HCL 25 MG/ML IJ SOLN
25.0000 mg | Freq: Once | INTRAMUSCULAR | Status: AC
  Administered 2011-12-09: 25 mg via INTRAMUSCULAR

## 2011-12-09 NOTE — Patient Instructions (Addendum)
Dear Anne Richardson,   Thank you for coming to see me in clinic today. Please read below regarding the specific issues that we addressed  1. Nausea and Vomiting - This is likely to last through the first trimester.   - Treatments: try to the following over-the-counter options: Vitamin B-6, Unisom tablets 25 mg each morning and each night  - Try to avoid greasy unhealthy foods for your nausea and for you general health; Try bland foods instead  - make sure to drink lots of water in the evenings if you have been nauseas that morning  2. Follow-Up - please see me in 2 weeks just so I can check your weight and see if you're nausea has improved.   3. Preventative Measures - please take pre-natal vitamins    Congratulations on the pregnancy, and I look forward to providing your care.   Sincerely,   Dr. Clinton Sawyer

## 2011-12-09 NOTE — Assessment & Plan Note (Signed)
Encourage to try Unisom 25 mg in the morning and at night, Vitamin B-6, and ginger tablets Follow-up in 2 weeks to chart weight - 11 lb weight loss 12/10-12/19

## 2011-12-09 NOTE — Progress Notes (Signed)
   Subjective:    Anne Richardson is a G45P1011 25 y.o. female being seen today for her obstetrical visit. She is at [redacted]w[redacted]d gestation. Patient reports heartburn, nausea and vomiting. The patient states that she is very nauseas everyday. It is worse in the mornings. She cannot tolerate food or water until after 6:00 PM. Prior to that she drinks only ginger ale. Before this week she was eating food from [employer] without problems. Now, she cannot tolerate it. She was seen for this problem on 11/30/11 at [facility]. She was prescribed zofran and phenergan. However, she did not fill the prescription, because she cannot afford it. Anne Richardson tried smoking marijauna for it once on the advice of a friend. However, this increased her nausea and made her feel like she "was going to die." Additional symptoms include subjective fever and chills.   Menstrual History: OB History    Grav Para Term Preterm Abortions TAB SAB Ect Mult Living   3 1 1  1 1    1        No LMP recorded. Patient is pregnant.    The following portions of the patient's history were reviewed and updated as appropriate: problem list.  Review of Systems   Objective:   BP 116/80  Temp 98.2 F (36.8 C)  Wt 255 lb 1.6 oz (115.713 kg)   Uterine Size: large abdomen, not able to determine secondary to obesity  Pelvic Exam:          Perineum: is normal                Vulva: normal              Vagina:  normal mucosa, wet prep done              Cervix: no bleeding following Pap    General: alert, oriented, pleasant, non-distressed Cardiac: RRR, no murmurs Lungs: clear to auscultation Abdomen: very obese, soft, non-tender, hyperactive bowel sounds    Assessment:    Pregnancy 9 and 6/7 weeks   Plan:    Problem list reviewed and updated. Labs reviewed. AFP3 discussed: declined. Amniocentesis discussed: declined. Follow up in 2 weeks.

## 2011-12-10 LAB — GC/CHLAMYDIA PROBE AMP, GENITAL
Chlamydia, DNA Probe: NEGATIVE
GC Probe Amp, Genital: NEGATIVE

## 2011-12-18 ENCOUNTER — Ambulatory Visit (INDEPENDENT_AMBULATORY_CARE_PROVIDER_SITE_OTHER): Payer: Self-pay | Admitting: Family Medicine

## 2011-12-18 VITALS — BP 108/74 | Temp 97.3°F | Wt 256.3 lb

## 2011-12-18 DIAGNOSIS — O219 Vomiting of pregnancy, unspecified: Secondary | ICD-10-CM

## 2011-12-18 MED ORDER — FOLIC ACID 400 MCG PO TABS: 400.0000 ug | ORAL_TABLET | Freq: Every day | ORAL | Status: DC

## 2011-12-18 NOTE — Patient Instructions (Addendum)
Pick up generic Unisom (Doxylamine) and take 1/2 of a 25 mg tablet twice a day.  Can also continue to use ginger and B6.  Start taking folic acid pills until you can take prenatal vitamins again   Morning Sickness Morning sickness is when you feel sick to your stomach (nauseous) during pregnancy. This nauseous feeling may or may not come with throwing up (vomiting). It often occurs in the morning, but can be a problem any time of day. While morning sickness is unpleasant, it is usually harmless unless you develop severe and continual vomiting (hyperemesis gravidarum). This condition requires more intense treatment. CAUSES   The cause of morning sickness is not completely known but seems to be related to a sudden increase of two hormones:    Human chorionic gonadotropin (hCG).     Estrogen hormone.  These are elevated in the first part of the pregnancy. TREATMENT   Do not use any medicines (prescription, over-the-counter, or herbal) for morning sickness without first talking to your caregiver. Some patients are helped by the following:  Vitamin B6 (25mg  every 8 hours) or vitamin B6 shots.     An antihistamine called doxylamine (10mg  every 8 hours).     The herbal medication ginger.  HOME CARE INSTRUCTIONS    Taking multivitamins before getting pregnant can prevent or decrease the severity of morning sickness in most women.     Eat a piece of dry toast or unsalted crackers before getting out of bed in the morning.     Eat 5 or 6 small meals a day.     Eat dry and bland foods (rice, baked potato).     Do not drink liquids with your meals. Drink liquids between meals.     Avoid greasy, fatty, and spicy foods.     Get someone to cook for you if the smell of any food causes nausea and vomiting.     Avoid vitamin pills with iron because iron can cause nausea.     Snack on protein foods between meals if you are hungry.     Eat unsweetened gelatins for deserts.     Wear an  acupressure wristband (worn for sea sickness) may be helpful.     Acupuncture may be helpful.     Do not smoke.     Get a humidifier to keep the air in your house free of odors.  SEEK MEDICAL CARE IF:    Your home remedies are not working and you need medication.     You feel dizzy or lightheaded.     You are losing weight.     You need help with your diet.  SEEK IMMEDIATE MEDICAL CARE IF:    You have persistent and uncontrolled nausea and vomiting.     You pass out (faint).     You have a fever.  MAKE SURE YOU:    Understand these instructions.     Will watch your condition.     Will get help right away if you are not doing well or get worse.  Document Released: 01/28/2007 Document Revised: 08/19/2011 Document Reviewed: 11/25/2007 [facility] Patient Information 2012 [place], [place].

## 2011-12-18 NOTE — Progress Notes (Signed)
CC: nausea/vomiting HPI:  Here for follow-up of nausea vomiting x 1-2 weeks.  Initially improved with B6 and ginger tabs.  Now continuing to have nausea.  Wts 11-21 and 12-19 in office both consistent with today's weight.  Has been unable to keep down PNV.  No abd pain, fever, chills, diarrhea. PE:  GEN: NAD, ABD: soft, + BS.  Unable to doppler, US showed cardiac activity and fetal movement. A/P:  Will try doxylamine (did not try yet) and B6.  Will also rx folic acid to take while unable to tolerate PNV.  No evidence of dehydration.  Given info on supportive care and has follow-up with PCP in about 2 weeks.  Given red flags for return

## 2011-12-31 ENCOUNTER — Ambulatory Visit (INDEPENDENT_AMBULATORY_CARE_PROVIDER_SITE_OTHER): Payer: Self-pay | Admitting: Family Medicine

## 2011-12-31 ENCOUNTER — Encounter: Payer: Self-pay | Admitting: Family Medicine

## 2011-12-31 VITALS — BP 124/79 | Wt 255.0 lb

## 2011-12-31 DIAGNOSIS — Z3400 Encounter for supervision of normal first pregnancy, unspecified trimester: Secondary | ICD-10-CM

## 2011-12-31 DIAGNOSIS — E669 Obesity, unspecified: Secondary | ICD-10-CM

## 2011-12-31 NOTE — Patient Instructions (Signed)
Dear Mrs. Stapleton,   Thank you for coming to see me in clinic today.    You will need to be seen for a follow-up visit in  Weeks.   Sincerely,   Dr. Clinton Sawyer

## 2012-01-13 DIAGNOSIS — E669 Obesity, unspecified: Secondary | ICD-10-CM | POA: Insufficient documentation

## 2012-01-13 NOTE — Progress Notes (Signed)
26 year old G3P1011 @ [redacted] weeks EGA by [place] presenting for routine pre-natal care CC: hyperemesis HPI: has not responded to B6, ginger tablets, or unisom; patient has tried taking PNV, but induces vomiting, so no longer taking them; feels like she is mildly improved b/c she is not nauseous everyday as she as previously PE: pleasant, cooperative, obese body habitus, uterine fundus not palpable Plan: f/u in OB clinic, needs glucola screening given obesity, hyperemesis should improve as she moves into second trimester, encouraged to start taking PNV, will need anatomy u/s after next visit

## 2012-01-18 ENCOUNTER — Encounter: Payer: Self-pay | Admitting: Family Medicine

## 2012-01-18 DIAGNOSIS — O34219 Maternal care for unspecified type scar from previous cesarean delivery: Secondary | ICD-10-CM | POA: Insufficient documentation

## 2012-01-18 NOTE — Progress Notes (Signed)
Addended by: [place], Hajime Asfaw T on: 01/18/2012 02:22 PM   Modules accepted: Orders

## 2012-01-21 ENCOUNTER — Ambulatory Visit (INDEPENDENT_AMBULATORY_CARE_PROVIDER_SITE_OTHER): Payer: Self-pay | Admitting: Family Medicine

## 2012-01-21 VITALS — BP 126/75 | Wt 263.0 lb

## 2012-01-21 DIAGNOSIS — Z331 Pregnant state, incidental: Secondary | ICD-10-CM

## 2012-01-21 LAB — GLUCOSE, CAPILLARY: Glucose-Capillary: 124 mg/dL — ABNORMAL HIGH (ref 70–99)

## 2012-01-21 NOTE — Patient Instructions (Signed)
It was a pleasure to see you in [facility] today. You are 16 weeks 0 days along in your pregnancy.   We are doing the 1 hour sugar test today.   For the constipation and gas, you may take OTC fiber such as metamucil (generic contains psyllium plant fiber); or over the counter Miralax (synthetic laxative that works like metamucil).    We are scheduling your Anatomy Scan ultrasound to be done between 18 and 20 weeks; your next appointment with Dr. Clinton Sawyer is in 4 weeks (at 20 weeks of pregnancy).  Keep taking the vitamins daily.   For the back pain, it is okay to use Tylenol in pregnancy.  Stretching before work, also heat pads.   FOLLOW UP OB WITH DR Clinton Sawyer IN 4 WEEKS.

## 2012-01-21 NOTE — Progress Notes (Signed)
Patient is G3P1 @[redacted]w[redacted]d , seen today in [facility].  She clarifies that her SAB was around 12 weeks (1st trimester).  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 Mild low back pain is better with stretching.   N/V resolved; now able to eat cheesesteak sandwiches and fries. Fruits and vegetables not tolerated as well.  Some constipation and flatulence.  Denies bleeding or d/c, no contractions, does not think she feels quickening yet.  We discussed genetic screening at length; she is not interested in quad screen, states that neither she nor FOB have history of genetic conditions, spina bifida in family.  For 1hGTT today for obesity and ethnicity.  Her daughter is seen by Pediatrician Dr Donnie Coffin; she is considering where she would like to take her new infant after delivery.  Discussed this at length, let her know we are open to receiving her baby but ultimately we support parents' decision about where they feel most comfortable.  Plan for anatomy scan in 2 weeks (at 18 weeks).  She is awaiting Medicaid card.  Follow up Dr. Clinton Sawyer in 4 weeks at 20 EGA. Paula Compton, M.D.

## 2012-02-05 ENCOUNTER — Ambulatory Visit (HOSPITAL_COMMUNITY)
Admission: RE | Admit: 2012-02-05 | Discharge: 2012-02-05 | Disposition: A | Discharge status: Home / Self Care | Payer: Self-pay | Source: Ambulatory Visit | Attending: Family Medicine | Admitting: Family Medicine

## 2012-02-05 DIAGNOSIS — Z1389 Encounter for screening for other disorder: Secondary | ICD-10-CM | POA: Insufficient documentation

## 2012-02-05 DIAGNOSIS — Z363 Encounter for antenatal screening for malformations: Secondary | ICD-10-CM | POA: Insufficient documentation

## 2012-02-05 DIAGNOSIS — O358XX0 Maternal care for other (suspected) fetal abnormality and damage, not applicable or unspecified: Secondary | ICD-10-CM | POA: Insufficient documentation

## 2012-02-05 DIAGNOSIS — Z331 Pregnant state, incidental: Secondary | ICD-10-CM

## 2012-02-16 ENCOUNTER — Inpatient Hospital Stay (HOSPITAL_COMMUNITY)
Admission: AD | Admit: 2012-02-16 | Discharge: 2012-02-16 | Disposition: A | Discharge status: Home / Self Care | Payer: Self-pay | Attending: Obstetrics & Gynecology | Admitting: Obstetrics & Gynecology

## 2012-02-16 ENCOUNTER — Encounter (HOSPITAL_COMMUNITY): Payer: Self-pay | Admitting: *Deleted

## 2012-02-16 ENCOUNTER — Inpatient Hospital Stay (HOSPITAL_COMMUNITY): Payer: Self-pay

## 2012-02-16 DIAGNOSIS — O99891 Other specified diseases and conditions complicating pregnancy: Secondary | ICD-10-CM | POA: Insufficient documentation

## 2012-02-16 DIAGNOSIS — O26899 Other specified pregnancy related conditions, unspecified trimester: Secondary | ICD-10-CM

## 2012-02-16 DIAGNOSIS — R109 Unspecified abdominal pain: Secondary | ICD-10-CM

## 2012-02-16 LAB — CBC
HCT: 35.2 % — ABNORMAL LOW (ref 36.0–46.0)
MCV: 88.4 fL (ref 78.0–100.0)
Platelets: 246 10*3/uL (ref 150–400)
RBC: 3.98 MIL/uL (ref 3.87–5.11)
RDW: 13.8 % (ref 11.5–15.5)
WBC: 14.4 10*3/uL — ABNORMAL HIGH (ref 4.0–10.5)

## 2012-02-16 NOTE — Discharge Instructions (Signed)
Abdominal Pain During Pregnancy Abdominal discomfort is common in pregnancy. Most of the time, it does not cause harm. There are many causes of abdominal pain. Some causes are more serious than others. Some of the causes of abdominal pain in pregnancy are easily diagnosed. Occasionally, the diagnosis takes time to understand. Other times, the cause is not determined. Abdominal pain can be a sign that something is very wrong with the pregnancy, or the pain may have nothing to do with the pregnancy at all. For this reason, always tell your caregiver if you have any abdominal discomfort. CAUSES Common and harmless causes of abdominal pain include:  Constipation.   Excess gas and bloating.   Round ligament pain. This is pain that is felt in the folds of the groin.   The position the baby or placenta is in.   Baby kicks.   Braxton-Hicks contractions. These are mild contractions that do not cause cervical dilation.  Serious causes of abdominal pain include:  Ectopic pregnancy. This happens when a fertilized egg implants outside of the uterus.   Miscarriage.   Preterm labor. This is when labor starts at less than 37 weeks of pregnancy.   Placental abruption. This is when the placenta partially or completely separates from the uterus.   Preeclampsia. This is often associated with high blood pressure and has been referred to as "toxemia in pregnancy."   Uterine or amniotic fluid infections.  Causes unrelated to pregnancy include:  Urinary tract infection.   Gallbladder stones or inflammation.   Hepatitis or other liver illness.   Intestinal problems, stomach flu, food poisoning, or ulcer.   Appendicitis.   Kidney (renal) stones.   Kidney infection (pylonephritis).  HOME CARE INSTRUCTIONS  For mild pain:  Do not have sexual intercourse or put anything in your vagina until your symptoms go away completely.   Get plenty of rest until your pain improves. If your pain does not  improve in 1 hour, call your caregiver.   Drink clear fluids if you feel nauseous. Avoid solid food as long as you are uncomfortable or nauseous.   Only take medicine as directed by your caregiver.   Keep all follow-up appointments with your caregiver.  SEEK IMMEDIATE MEDICAL CARE IF:  You are bleeding, leaking fluid, or passing tissue from the vagina.   You have increasing pain or cramping.   You have persistent vomiting.   You have painful or bloody urination.   You have a fever.   You notice a decrease in your baby's movements.   You have extreme weakness or feel faint.   You have shortness of breath, with or without abdominal pain.   You develop a severe headache with abdominal pain.   You have abnormal vaginal discharge with abdominal pain.   You have persistent diarrhea.   You have abdominal pain that continues even after rest, or gets worse.  MAKE SURE YOU:   Understand these instructions.   Will watch your condition.   Will get help right away if you are not doing well or get worse.  Document Released: 12/07/2005 Document Revised: 08/19/2011 Document Reviewed: 07/03/2011 [employer] Patient Information 2012 [employer]. 

## 2012-02-16 NOTE — ED Provider Notes (Signed)
History     CSN: [id number]  Arrival date & time 02/16/12  4098   None     Chief Complaint  Patient presents with  . Abdominal Pain    HPI Anne Richardson is a 26 y.o. female @ [redacted]w[redacted]d gestation who presents to [facility] for abdominal pain after falling down 13 steps inside her home this am. She states that most of the impact was on her side but then she did hit her abdomen and is having abdominal pain since the fall. Denies vaginal bleeding. Did not hit head, denies headache or LOC.  The fall occurred at 5:30 am. Blood type is B positive. The history was provided by the patient.  Past Medical History  Diagnosis Date  . No pertinent past medical history     Past Surgical History  Procedure Date  . Cesarean section   . Breast lumpectomy     left  . Biopsy breast     Family History  Problem Relation Age of Onset  . Cancer Mother 49    breast  . Alcohol abuse Mother   . Depression Mother   . Hypertension Mother   . Depression Sister   . Hypertension Sister   . Asthma Brother   . Depression Brother   . Hypertension Brother   . Alcohol abuse Maternal Aunt   . Hypertension Maternal Aunt   . Cancer Maternal Aunt 65    breast ca  . Alcohol abuse Maternal Uncle   . Hypertension Maternal Uncle   . Alzheimer's disease Maternal Grandmother   . Heart disease Maternal Grandmother   . Alcohol abuse Maternal Grandmother   . Hypertension Maternal Grandmother   . Heart disease Maternal Grandfather   . Alcohol abuse Maternal Grandfather   . Hypertension Maternal Grandfather   . Cancer Maternal Grandfather     prostate and bone  . Depression Paternal Grandfather     History  Substance Use Topics  . Smoking status: Never Smoker   . Smokeless tobacco: Not on file  . Alcohol Use: No    OB History    Grav Para Term Preterm Abortions TAB SAB Ect Mult Living   3 1 1  1  0 1   1      Review of Systems  Constitutional: Negative for fever, chills, diaphoresis and fatigue.    HENT: Negative for ear pain, congestion, sore throat, facial swelling, neck pain, neck stiffness, dental problem and sinus pressure.   Eyes: Negative for photophobia, pain, discharge and visual disturbance.  Respiratory: Negative for cough, chest tightness and wheezing.   Cardiovascular: Negative.   Gastrointestinal: Positive for abdominal pain. Negative for nausea, vomiting, diarrhea, constipation and abdominal distention.  Genitourinary: Negative for dysuria, urgency, frequency, flank pain, vaginal bleeding, vaginal discharge and difficulty urinating.  Musculoskeletal: Positive for back pain. Negative for myalgias and gait problem.  Skin: Negative for color change and rash.  Neurological: Negative for dizziness, speech difficulty, weakness, light-headedness, numbness and headaches.  Psychiatric/Behavioral: Negative for confusion and agitation.    Allergies  Review of patient's allergies indicates no known allergies.  Home Medications  No current outpatient prescriptions on file.  BP 117/73  Pulse 95  Temp 98.1 F (36.7 C)  Resp 18  Ht 5\' 6"  (1.676 m)  Wt 277 lb (125.646 kg)  BMI 44.71 kg/m2  SpO2 99%  LMP 09/06/2011  Breastfeeding? Unknown  Physical Exam  Nursing note and vitals reviewed. Constitutional: She is oriented to person, place, and time. She appears  well-developed and well-nourished.  HENT:  Head: Normocephalic and atraumatic.  Eyes: EOM are normal.  Neck: Neck supple.  Cardiovascular: Normal rate and regular rhythm.   Pulmonary/Chest: Effort normal and breath sounds normal.  Abdominal: Soft. Bowel sounds are normal. There is generalized tenderness. There is no rigidity, no rebound, no guarding and no CVA tenderness.       Increased tenderness in left upper and lower quads. Positive FHT's.  Musculoskeletal: Normal range of motion.  Neurological: She is alert and oriented to person, place, and time. She has normal strength. No cranial nerve deficit or sensory  deficit.  Skin: Skin is warm and dry.  Psychiatric: She has a normal mood and affect. Her behavior is normal. Judgment and thought content normal.   Assessment: Abdominal pain s/p fall this am  Plan:  CBC   Ultrasound  ED Course: Care turned over to [facility] @ 8:i00 am  Procedures   MDM          Kerrie Buffalo, NP 02/16/12 [phone]  I have assumed care of this pt from [facility], FNP.  Results for orders placed during the hospital encounter of 02/16/12 (from the past 72 hour(s))  CBC     Status: Abnormal   Collection Time   02/16/12  7:05 AM      Component Value Range Comment   WBC 14.4 (*) 4.0 - 10.5 (K/uL)    RBC 3.98  3.87 - 5.11 (MIL/uL)    Hemoglobin 11.6 (*) 12.0 - 15.0 (g/dL)    HCT 96.0 (*) 45.4 - 46.0 (%)    MCV 88.4  78.0 - 100.0 (fL)    MCH 29.1  26.0 - 34.0 (pg)    MCHC 33.0  30.0 - 36.0 (g/dL)    RDW 09.8  11.9 - 14.7 (%)    Platelets 246  150 - 400 (K/uL)    US shows single IUP at 19.5wks with no evidence of abruption or previa.  A/P: Maternal fall: discussed with pt at length. Discussed diet, activity, risks, and precautions. She will f/u with her OB provider.   Clinton Gallant. Echo Allsbrook III, DrHSc, MPAS, PA-C   Henrietta Hoover, [place] 02/16/12 [phone]

## 2012-02-16 NOTE — Progress Notes (Signed)
Pt state she fell down about 13 stairs about 1 hour ago. States she did hit her side and abd. Hurting in left upper quadrant and lower abd. Denies bleeding

## 2012-02-18 ENCOUNTER — Ambulatory Visit: Payer: Self-pay | Admitting: Family Medicine

## 2012-02-18 NOTE — Patient Instructions (Signed)
Dear Mrs. Jacobson,   Thank you for coming to clinic today. Please read below regarding the issues that we discussed.   1. Pain - Please continue to use tylenol as need and apply a heating pad to your back and side. Do not place the heating pad on your abdomen. If you pain worsens, then give [place] a call. Returning to work on Monday should be appropriate.   2. Nutrition - I am concerned about your nutrition given your diet change and weight gain. Trying to eat more fruits and vegetables and less cheese steaks is good for you and the baby. If you would like to meet with our dietician Dr. Wyona Almas, then please call [phone] to set up an appointment. This could be a great resource for you.   3. Birthing Classes - Please take at the list of classes and call if you are interested in any.   Please follow up in clinic in 4 weeks . Please call earlier if you have any questions or concerns.   Sincerely,   Dr. Clinton Sawyer

## 2012-02-18 NOTE — Progress Notes (Signed)
Mrs. Grissett [id number] @ 20wOd presents for routine pre-natal care.  CC: Back pain HPI: Patient fell down stairs on 2/26, evaluated by [facility] and not evidence of abruption, currently notes pain in lower back bilaterally and left hip, taking tylenol for pain, no contraction or vaginal bleeding; positive for bladder pressure; neg nausea, vomiting PE:  Vitals: patient 20 lb weight gain since 1/10  Gen: pleasant, in discomfort, obese body habitus,  MSK: tenderness to palpation of lumbar spine, no body deformity, normal ROM of left hip  Abdomen:  Large panus, uterine fundus palpable at umbilicus, 20 cm   Fetal - HR @ 140 bpm A/P: 26 year old @ [redacted]w[redacted]d gestation without any complications of recent fall down stairs on 2/26.  1. Pain in back and hip - partly due to fall, partly due to morbid obesity in pregnancy; continue tylenol and use heating pad PRN on back 2. Nutrition - very poor eating habits with 20lb weight gain during pregnacy; encouraged to eat more fruits and vegetables, given contact information for Riley Kill, PhD nutritionist 3. Birthing Class - given list of pre-natal classes offered at [facility] and encouraged to participate especially since she wants VBAC 4. F/u -regular 24 weeks visit in 4 weeks

## 2012-03-15 ENCOUNTER — Ambulatory Visit (INDEPENDENT_AMBULATORY_CARE_PROVIDER_SITE_OTHER): Payer: Self-pay | Admitting: Family Medicine

## 2012-03-15 VITALS — BP 127/81 | Wt 286.0 lb

## 2012-03-15 DIAGNOSIS — Z3480 Encounter for supervision of other normal pregnancy, unspecified trimester: Secondary | ICD-10-CM

## 2012-03-15 DIAGNOSIS — Z3490 Encounter for supervision of normal pregnancy, unspecified, unspecified trimester: Secondary | ICD-10-CM

## 2012-03-21 NOTE — Progress Notes (Signed)
Anne Richardson is a G37P1011 F @ 23w 5d by 1st tri u/s who presents for routine pre-natal care CC: very fatigued at work b/c standing most of day, would like work hours reduced to 30 per week from 36; also notes protected sex 5 days ago, known partner and doesn't think is it as high risk for STI PE: BP 127/81  Wt 286 lb (129.729 kg)  LMP 09/06/2011  Breastfeeding? Unknown  30lb weight gain since pregnancy  Gen: quiet, appears uncomfortable, polite, morbidly obese body habitus  Abd: large panus, fundus of uterus not palpable, tremendous difficulty using doppler 2/2 body habitus  Fetal HR - 80 A/P: 26 year old @ 23w 5d gestation with complication of morbid obesity.  1. Write note to reduce work hour per her request  2. F/u in 4 weeks, schedule 1 hours GTT for that time

## 2012-04-04 ENCOUNTER — Telehealth: Payer: Self-pay | Admitting: Family Medicine

## 2012-04-04 NOTE — Telephone Encounter (Signed)
Attempted to call patient back at number  at work.  It asks to put in extension number.  None provided. Then ask for last name to be spelled using touch keys on phone. No match for person at that work number. tried cell phone number . No answer.

## 2012-04-04 NOTE — Telephone Encounter (Signed)
Pt is 25 weeks preg - she is feeling like she is having contractions - not sure what she shold do.

## 2012-04-04 NOTE — Telephone Encounter (Signed)
Spoke with patient and she states she started having contractions , cramping yesterday. Now having about every 10 minutes.  Feels a cramping sensation in lower abdomen every 10 minutes. Has had some vaginal discharge. Last week noticed slight blood . Consulted with Dr. Mauricio Po . Advised to go  [facility] now.

## 2012-04-04 NOTE — Telephone Encounter (Signed)
Patient returned call to [facility].  She is giving her Managers number as an alterate route to reach her.  That number is [phone].

## 2012-04-07 ENCOUNTER — Encounter: Payer: Self-pay | Admitting: Family Medicine

## 2012-05-06 ENCOUNTER — Encounter: Payer: Self-pay | Admitting: Family Medicine

## 2012-05-24 ENCOUNTER — Telehealth: Payer: Self-pay | Admitting: Family Medicine

## 2012-05-24 ENCOUNTER — Ambulatory Visit (INDEPENDENT_AMBULATORY_CARE_PROVIDER_SITE_OTHER): Payer: Self-pay | Admitting: Family Medicine

## 2012-05-24 VITALS — BP 116/79 | Temp 98.1°F | Wt 302.0 lb

## 2012-05-24 DIAGNOSIS — O26849 Uterine size-date discrepancy, unspecified trimester: Secondary | ICD-10-CM

## 2012-05-24 DIAGNOSIS — Z3490 Encounter for supervision of normal pregnancy, unspecified, unspecified trimester: Secondary | ICD-10-CM | POA: Insufficient documentation

## 2012-05-24 DIAGNOSIS — Z23 Encounter for immunization: Secondary | ICD-10-CM

## 2012-05-24 DIAGNOSIS — O34219 Maternal care for unspecified type scar from previous cesarean delivery: Secondary | ICD-10-CM

## 2012-05-24 DIAGNOSIS — O26843 Uterine size-date discrepancy, third trimester: Secondary | ICD-10-CM | POA: Insufficient documentation

## 2012-05-24 DIAGNOSIS — Z3480 Encounter for supervision of other normal pregnancy, unspecified trimester: Secondary | ICD-10-CM

## 2012-05-24 DIAGNOSIS — O3421 Maternal care for scar from previous cesarean delivery: Secondary | ICD-10-CM

## 2012-05-24 LAB — GLUCOSE, CAPILLARY
Comment 1: 1
Glucose-Capillary: 137 mg/dL — ABNORMAL HIGH (ref 70–99)

## 2012-05-24 LAB — CBC WITH DIFFERENTIAL/PLATELET
Basophils Relative: 0 % (ref 0–1)
Eosinophils Absolute: 0.2 10*3/uL (ref 0.0–0.7)
Eosinophils Relative: 2 % (ref 0–5)
Lymphs Abs: 1.6 10*3/uL (ref 0.7–4.0)
MCH: 27.3 pg (ref 26.0–34.0)
MCHC: 33 g/dL (ref 30.0–36.0)
MCV: 82.6 fL (ref 78.0–100.0)
Monocytes Relative: 7 % (ref 3–12)
Neutrophils Relative %: 75 % (ref 43–77)
Platelets: 288 10*3/uL (ref 150–400)
RBC: 3.96 MIL/uL (ref 3.87–5.11)

## 2012-05-24 NOTE — Progress Notes (Signed)
Anne Richardson is a G23P1011 F @ 33w 5d by 1st tri u/s who presents after not showing for her previous 2 appointments.  S: She does not give a great explanation of why she missed the visits. Nonetheless, she is present today. Denies vag bleeding, leakage of fluid, uterine contraction. Notes lower anterior pelvic pain persistent for months.  PE: BP 116/79  Temp 98.1 F (36.7 C)  Wt 302 lb (136.986 kg)  LMP 09/06/2011  Breastfeeding? Unknown  Gen: very obese, non distressed, very pleasant  Abd: fundus @ 42 cm  FHR 140  1 hour glucola = 137 PHQ = 9   A/P: 26 y.o. F G3P1011 @ 33.5 who has not been present in 10 weeks who has complications of obesity, glucose intolerance, and poor compliance.  1. Glucose intolerance - schedule 3 hour test 2. Size-date discrepancy - refer to [facility] for ultrasound 3. Hx of C-section: patient wants to try VBAC, but will need to meet w/ OB regardless so referral to OB @ [facility] clinic 4. Social - patient has not registered for medicaid, so told that she needs to do so  5. Counseling - given on pre-term contractions and kick count 6. Labs - check CBC, RPR, HIV, Varicella IgG 7. TDaP today

## 2012-05-24 NOTE — Telephone Encounter (Signed)
Attempted to call the patient to encourage her to complete Medicaid information.

## 2012-05-24 NOTE — Patient Instructions (Signed)
Dear Mrs. Chou,   Thank you for coming to clinic today. Please read below regarding the issues that we discussed.   1. The size of the uterus is larger than we expect at this point. Therefore, we would like to get an ultrasound to assess the baby's growth.  2. History of C-Section: You will need to meet with an OB doctor at [facility] just in case you decide to have a cesarean or you end of needing one for medical reasons.   3. Elevated Blood Sugar - The lab will schedule you a time for a 3 hours test of your blood sugar.   4. Contractions - Expect to have pre-term contractions. If you have them, then drink lots of water to make sure that you are not dehydrated. If there are more than 4 per hour, then go to [facility] for evaluation.   5. Kick Count - If the baby ever seems to be kicking less than 5 minutes per hour for 2 straight hours, then go to [facility] for evaluation.   Please follow up in clinic in 2 weeks . Please call earlier if you have any questions or concerns.   Sincerely,   Dr. Clinton Sawyer

## 2012-05-26 ENCOUNTER — Encounter: Payer: Self-pay | Admitting: Family Medicine

## 2012-05-27 ENCOUNTER — Ambulatory Visit (HOSPITAL_COMMUNITY)
Admission: RE | Admit: 2012-05-27 | Discharge: 2012-05-27 | Disposition: A | Discharge status: Home / Self Care | Payer: Medicaid Other | Source: Ambulatory Visit | Attending: Family Medicine | Admitting: Family Medicine

## 2012-05-27 DIAGNOSIS — E669 Obesity, unspecified: Secondary | ICD-10-CM | POA: Insufficient documentation

## 2012-05-27 DIAGNOSIS — O3660X0 Maternal care for excessive fetal growth, unspecified trimester, not applicable or unspecified: Secondary | ICD-10-CM | POA: Insufficient documentation

## 2012-05-27 DIAGNOSIS — Z3490 Encounter for supervision of normal pregnancy, unspecified, unspecified trimester: Secondary | ICD-10-CM

## 2012-05-27 DIAGNOSIS — Z3689 Encounter for other specified antenatal screening: Secondary | ICD-10-CM | POA: Insufficient documentation

## 2012-05-30 ENCOUNTER — Other Ambulatory Visit: Payer: Self-pay

## 2012-06-06 ENCOUNTER — Encounter: Payer: Self-pay | Admitting: Family Medicine

## 2012-06-07 ENCOUNTER — Telehealth: Payer: Self-pay | Admitting: Family Medicine

## 2012-06-07 ENCOUNTER — Encounter: Payer: Self-pay | Admitting: Family Medicine

## 2012-06-07 NOTE — Telephone Encounter (Signed)
I attempted to call Anne Richardson, because she missed her appointment with my today is at approximately [redacted] weeks gestation. This comes after she did not show for any appointment between 23 and [redacted] weeks gestational age. She has a history of a C-section and an LGA baby per ultrasound on 05/27/12. Therefore, she needs to be seen by an OB this week. When I call her number it seems as if someone is answering, but I cannot hear anyone on the other line. This happened several times when I called a few weeks ago. I think that the patient's phone is not working.

## 2012-06-16 ENCOUNTER — Telehealth: Payer: Self-pay | Admitting: Family Medicine

## 2012-06-16 NOTE — Telephone Encounter (Signed)
I attempted to call Mrs. Anne Richardson.  The line connected, but I could not hear a recipient on the other end. This has occurred every time that I call her at [phone] for the last few months.I also called both of her emergency contacts, whose telephone numbers were not in service. There was no answer at her place of employment either.   She did not show for her appointment on 06/07/12 and has yet to call the clinic. She has an appointment scheduled for 7/11 to talk with an OB-GYN about a repeat cesarean section. Hopefully, at that time she will present to her appointment.

## 2012-06-28 ENCOUNTER — Encounter (HOSPITAL_COMMUNITY): Payer: Self-pay | Admitting: *Deleted

## 2012-06-28 ENCOUNTER — Inpatient Hospital Stay (HOSPITAL_COMMUNITY)
Admission: AD | Admit: 2012-06-28 | Discharge: 2012-06-30 | DRG: 765 | Disposition: A | Discharge status: Home / Self Care | Payer: Medicaid Other | Source: Ambulatory Visit | Attending: Obstetrics & Gynecology | Admitting: Obstetrics & Gynecology

## 2012-06-28 ENCOUNTER — Encounter (HOSPITAL_COMMUNITY)
Admission: AD | Disposition: A | Discharge status: Home / Self Care | Payer: Self-pay | Source: Ambulatory Visit | Attending: Obstetrics & Gynecology

## 2012-06-28 ENCOUNTER — Encounter (HOSPITAL_COMMUNITY): Payer: Self-pay | Admitting: Anesthesiology

## 2012-06-28 ENCOUNTER — Inpatient Hospital Stay (HOSPITAL_COMMUNITY): Payer: Medicaid Other | Admitting: Anesthesiology

## 2012-06-28 ENCOUNTER — Encounter (HOSPITAL_COMMUNITY): Payer: Self-pay

## 2012-06-28 DIAGNOSIS — IMO0002 Reserved for concepts with insufficient information to code with codable children: Secondary | ICD-10-CM

## 2012-06-28 DIAGNOSIS — O34219 Maternal care for unspecified type scar from previous cesarean delivery: Secondary | ICD-10-CM

## 2012-06-28 DIAGNOSIS — E669 Obesity, unspecified: Secondary | ICD-10-CM

## 2012-06-28 DIAGNOSIS — O1400 Mild to moderate pre-eclampsia, unspecified trimester: Secondary | ICD-10-CM

## 2012-06-28 DIAGNOSIS — Z98891 History of uterine scar from previous surgery: Secondary | ICD-10-CM

## 2012-06-28 DIAGNOSIS — O99214 Obesity complicating childbirth: Secondary | ICD-10-CM

## 2012-06-28 HISTORY — DX: Gestational (pregnancy-induced) hypertension without significant proteinuria, unspecified trimester: O13.9

## 2012-06-28 LAB — URINALYSIS, ROUTINE W REFLEX MICROSCOPIC
Nitrite: NEGATIVE
Specific Gravity, Urine: 1.025 (ref 1.005–1.030)
Urobilinogen, UA: 0.2 mg/dL (ref 0.0–1.0)
pH: 6.5 (ref 5.0–8.0)

## 2012-06-28 LAB — COMPREHENSIVE METABOLIC PANEL
ALT: 10 U/L (ref 0–35)
AST: 17 U/L (ref 0–37)
Alkaline Phosphatase: 102 U/L (ref 39–117)
CO2: 26 mEq/L (ref 19–32)
Chloride: 102 mEq/L (ref 96–112)
GFR calc non Af Amer: >90 mL/min (ref >90)
Glucose, Bld: 90 mg/dL (ref 70–99)
Potassium: 4.1 mEq/L (ref 3.5–5.1)
Sodium: 137 mEq/L (ref 135–145)
Total Bilirubin: 0.2 mg/dL — ABNORMAL LOW (ref 0.3–1.2)

## 2012-06-28 LAB — CBC
Hemoglobin: 10.1 g/dL — ABNORMAL LOW (ref 12.0–15.0)
Hemoglobin: 10 g/dL — ABNORMAL LOW (ref 12.0–15.0)
MCH: 24.6 pg — ABNORMAL LOW (ref 26.0–34.0)
MCHC: 30.6 g/dL (ref 30.0–36.0)
MCV: 80.3 fL (ref 78.0–100.0)
Platelets: 277 10*3/uL (ref 150–400)
RBC: 4 MIL/uL (ref 3.87–5.11)
WBC: 11 10*3/uL — ABNORMAL HIGH (ref 4.0–10.5)

## 2012-06-28 LAB — URINE MICROSCOPIC-ADD ON

## 2012-06-28 LAB — GLUCOSE, CAPILLARY

## 2012-06-28 LAB — MRSA PCR SCREENING: MRSA by PCR: NEGATIVE

## 2012-06-28 LAB — PROTEIN / CREATININE RATIO, URINE: Protein Creatinine Ratio: 0.39 — ABNORMAL HIGH (ref 0.00–0.15)

## 2012-06-28 SURGERY — Surgical Case
Anesthesia: Epidural | Site: Abdomen | Wound class: Clean Contaminated

## 2012-06-28 MED ORDER — LANOLIN HYDROUS EX OINT: 1.0000 "application " | TOPICAL_OINTMENT | CUTANEOUS | Status: DC | PRN

## 2012-06-28 MED ORDER — SCOPOLAMINE 1 MG/3DAYS TD PT72
1.0000 | MEDICATED_PATCH | Freq: Once | TRANSDERMAL | Status: DC
  Administered 2012-06-28: 1.5 mg via TRANSDERMAL

## 2012-06-28 MED ORDER — MAGNESIUM SULFATE BOLUS VIA INFUSION
4.0000 g | Freq: Once | INTRAVENOUS | Status: AC
  Administered 2012-06-28: 4 g via INTRAVENOUS
  Filled 2012-06-28: qty 500

## 2012-06-28 MED ORDER — OXYCODONE-ACETAMINOPHEN 5-325 MG PO TABS
1.0000 | ORAL_TABLET | ORAL | Status: DC | PRN
  Administered 2012-06-29 – 2012-06-30 (×3): 1 via ORAL
  Filled 2012-06-28 (×3): qty 1

## 2012-06-28 MED ORDER — NALOXONE HCL 0.4 MG/ML IJ SOLN: 0.4000 mg | INTRAMUSCULAR | Status: DC | PRN

## 2012-06-28 MED ORDER — LACTATED RINGERS IV SOLN
INTRAVENOUS | Status: DC
  Administered 2012-06-28 – 2012-06-29 (×2): via INTRAVENOUS

## 2012-06-28 MED ORDER — ONDANSETRON HCL 4 MG/2ML IJ SOLN
INTRAMUSCULAR | Status: DC | PRN
  Administered 2012-06-28: 4 mg via INTRAVENOUS

## 2012-06-28 MED ORDER — NALBUPHINE HCL 10 MG/ML IJ SOLN
5.0000 mg | INTRAMUSCULAR | Status: DC | PRN
  Administered 2012-06-28: 10 mg via INTRAVENOUS
  Administered 2012-06-29: 5 mg via INTRAVENOUS
  Filled 2012-06-28 (×3): qty 1

## 2012-06-28 MED ORDER — ONDANSETRON HCL 4 MG/2ML IJ SOLN: 4.0000 mg | Freq: Three times a day (TID) | INTRAMUSCULAR | Status: DC | PRN

## 2012-06-28 MED ORDER — MAGNESIUM SULFATE 40 G IN LACTATED RINGERS - SIMPLE
2.0000 g/h | INTRAVENOUS | Status: DC
  Administered 2012-06-28: 2 g/h via INTRAVENOUS
  Filled 2012-06-28: qty 500

## 2012-06-28 MED ORDER — TETANUS-DIPHTH-ACELL PERTUSSIS 5-2.5-18.5 LF-MCG/0.5 IM SUSP
0.5000 mL | Freq: Once | INTRAMUSCULAR | Status: DC
  Filled 2012-06-28: qty 0.5

## 2012-06-28 MED ORDER — DIPHENHYDRAMINE HCL 25 MG PO CAPS: 25.0000 mg | ORAL_CAPSULE | Freq: Four times a day (QID) | ORAL | Status: DC | PRN

## 2012-06-28 MED ORDER — DIBUCAINE 1 % RE OINT: 1.0000 "application " | TOPICAL_OINTMENT | RECTAL | Status: DC | PRN

## 2012-06-28 MED ORDER — IBUPROFEN 600 MG PO TABS
600.0000 mg | ORAL_TABLET | Freq: Four times a day (QID) | ORAL | Status: DC | PRN
  Filled 2012-06-28 (×2): qty 1

## 2012-06-28 MED ORDER — DIPHENHYDRAMINE HCL 50 MG/ML IJ SOLN
12.5000 mg | INTRAMUSCULAR | Status: DC | PRN
  Administered 2012-06-28: 12.5 mg via INTRAVENOUS
  Filled 2012-06-28: qty 1

## 2012-06-28 MED ORDER — OXYTOCIN 10 UNIT/ML IJ SOLN
40.0000 [IU] | INTRAVENOUS | Status: DC | PRN
  Administered 2012-06-28: 40 [IU] via INTRAVENOUS

## 2012-06-28 MED ORDER — MEPERIDINE HCL 25 MG/ML IJ SOLN: 6.2500 mg | INTRAMUSCULAR | Status: DC | PRN

## 2012-06-28 MED ORDER — KETOROLAC TROMETHAMINE 30 MG/ML IJ SOLN: 30.0000 mg | Freq: Four times a day (QID) | INTRAMUSCULAR | Status: AC | PRN

## 2012-06-28 MED ORDER — ONDANSETRON HCL 4 MG PO TABS: 4.0000 mg | ORAL_TABLET | ORAL | Status: DC | PRN

## 2012-06-28 MED ORDER — CITRIC ACID-SODIUM CITRATE 334-500 MG/5ML PO SOLN
30.0000 mL | Freq: Once | ORAL | Status: AC
  Administered 2012-06-28: 30 mL via ORAL
  Filled 2012-06-28: qty 15

## 2012-06-28 MED ORDER — SENNOSIDES-DOCUSATE SODIUM 8.6-50 MG PO TABS
2.0000 | ORAL_TABLET | Freq: Every day | ORAL | Status: DC
  Administered 2012-06-28 – 2012-06-29 (×2): 2 via ORAL

## 2012-06-28 MED ORDER — OXYTOCIN 40 UNITS IN LACTATED RINGERS INFUSION - SIMPLE MED: 62.5000 mL/h | INTRAVENOUS | Status: DC

## 2012-06-28 MED ORDER — SODIUM CHLORIDE 0.9 % IV SOLN: 1.0000 ug/kg/h | INTRAVENOUS | Status: DC | PRN

## 2012-06-28 MED ORDER — DEXTROSE 5 % IV SOLN
3.0000 g | INTRAVENOUS | Status: AC
  Administered 2012-06-28: 3 g via INTRAVENOUS
  Filled 2012-06-28: qty 30

## 2012-06-28 MED ORDER — PRENATAL MULTIVITAMIN CH
1.0000 | ORAL_TABLET | Freq: Every day | ORAL | Status: DC
  Administered 2012-06-28 – 2012-06-30 (×3): 1 via ORAL
  Filled 2012-06-28 (×4): qty 1

## 2012-06-28 MED ORDER — MENTHOL 3 MG MT LOZG: 1.0000 | LOZENGE | OROMUCOSAL | Status: DC | PRN

## 2012-06-28 MED ORDER — SODIUM BICARBONATE 8.4 % IV SOLN
INTRAVENOUS | Status: DC | PRN
  Administered 2012-06-28: 3 mL via EPIDURAL

## 2012-06-28 MED ORDER — SIMETHICONE 80 MG PO CHEW
80.0000 mg | CHEWABLE_TABLET | Freq: Three times a day (TID) | ORAL | Status: DC
  Administered 2012-06-28 – 2012-06-30 (×7): 80 mg via ORAL

## 2012-06-28 MED ORDER — KETOROLAC TROMETHAMINE 60 MG/2ML IM SOLN
INTRAMUSCULAR | Status: AC
  Administered 2012-06-28: 60 mg via INTRAMUSCULAR
  Filled 2012-06-28: qty 2

## 2012-06-28 MED ORDER — SODIUM CHLORIDE 0.9 % IJ SOLN: 3.0000 mL | INTRAMUSCULAR | Status: DC | PRN

## 2012-06-28 MED ORDER — KETOROLAC TROMETHAMINE 60 MG/2ML IM SOLN
60.0000 mg | Freq: Once | INTRAMUSCULAR | Status: AC | PRN
  Administered 2012-06-28: 60 mg via INTRAMUSCULAR

## 2012-06-28 MED ORDER — FAMOTIDINE IN NACL 20-0.9 MG/50ML-% IV SOLN
20.0000 mg | Freq: Once | INTRAVENOUS | Status: AC
  Administered 2012-06-28: 20 mg via INTRAVENOUS
  Filled 2012-06-28: qty 50

## 2012-06-28 MED ORDER — WITCH HAZEL-GLYCERIN EX PADS: 1.0000 "application " | MEDICATED_PAD | CUTANEOUS | Status: DC | PRN

## 2012-06-28 MED ORDER — SCOPOLAMINE 1 MG/3DAYS TD PT72
MEDICATED_PATCH | TRANSDERMAL | Status: AC
  Filled 2012-06-28: qty 1

## 2012-06-28 MED ORDER — SIMETHICONE 80 MG PO CHEW: 80.0000 mg | CHEWABLE_TABLET | ORAL | Status: DC | PRN

## 2012-06-28 MED ORDER — MORPHINE SULFATE (PF) 0.5 MG/ML IJ SOLN
INTRAMUSCULAR | Status: DC | PRN
  Administered 2012-06-28: 4 mg via EPIDURAL

## 2012-06-28 MED ORDER — METOCLOPRAMIDE HCL 5 MG/ML IJ SOLN: 10.0000 mg | Freq: Three times a day (TID) | INTRAMUSCULAR | Status: DC | PRN

## 2012-06-28 MED ORDER — LACTATED RINGERS IV SOLN
INTRAVENOUS | Status: DC
  Administered 2012-06-28 (×3): via INTRAVENOUS

## 2012-06-28 MED ORDER — ONDANSETRON HCL 4 MG/2ML IJ SOLN: 4.0000 mg | INTRAMUSCULAR | Status: DC | PRN

## 2012-06-28 MED ORDER — BUPIVACAINE HCL (PF) 0.25 % IJ SOLN
INTRAMUSCULAR | Status: DC | PRN
  Administered 2012-06-28: 30 mL

## 2012-06-28 MED ORDER — HYDROMORPHONE HCL PF 1 MG/ML IJ SOLN: 0.2500 mg | INTRAMUSCULAR | Status: DC | PRN

## 2012-06-28 MED ORDER — DIPHENHYDRAMINE HCL 25 MG PO CAPS
25.0000 mg | ORAL_CAPSULE | ORAL | Status: DC | PRN
  Administered 2012-06-28 – 2012-06-29 (×3): 25 mg via ORAL
  Filled 2012-06-28 (×3): qty 1

## 2012-06-28 MED ORDER — NALBUPHINE HCL 10 MG/ML IJ SOLN
5.0000 mg | INTRAMUSCULAR | Status: DC | PRN
  Filled 2012-06-28: qty 1

## 2012-06-28 MED ORDER — DIPHENHYDRAMINE HCL 50 MG/ML IJ SOLN: 25.0000 mg | INTRAMUSCULAR | Status: DC | PRN

## 2012-06-28 MED ORDER — IBUPROFEN 600 MG PO TABS
600.0000 mg | ORAL_TABLET | Freq: Four times a day (QID) | ORAL | Status: DC
  Administered 2012-06-29 – 2012-06-30 (×6): 600 mg via ORAL
  Filled 2012-06-28 (×4): qty 1

## 2012-06-28 MED ORDER — LACTATED RINGERS IV SOLN
INTRAVENOUS | Status: DC | PRN
  Administered 2012-06-28: 13:00:00 via INTRAVENOUS

## 2012-06-28 MED ORDER — ZOLPIDEM TARTRATE 5 MG PO TABS: 5.0000 mg | ORAL_TABLET | Freq: Every evening | ORAL | Status: DC | PRN

## 2012-06-28 MED ORDER — ENOXAPARIN SODIUM 40 MG/0.4ML ~~LOC~~ SOLN
40.0000 mg | SUBCUTANEOUS | Status: DC
  Administered 2012-06-28 – 2012-06-29 (×2): 40 mg via SUBCUTANEOUS
  Filled 2012-06-28 (×2): qty 0.4

## 2012-06-28 SURGICAL SUPPLY — 38 items
APL SKNCLS STERI-STRIP NONHPOA (GAUZE/BANDAGES/DRESSINGS) ×1
BENZOIN TINCTURE PRP APPL 2/3 (GAUZE/BANDAGES/DRESSINGS) ×1 IMPLANT
CHLORAPREP W/TINT 26ML (MISCELLANEOUS) ×2 IMPLANT
CLOTH BEACON ORANGE TIMEOUT ST (SAFETY) ×2 IMPLANT
CONTAINER PREFILL 10% NBF 15ML (MISCELLANEOUS) IMPLANT
DRAIN JACKSON PRT FLT 7MM (DRAIN) IMPLANT
DRESSING TELFA 8X3 (GAUZE/BANDAGES/DRESSINGS) IMPLANT
DRSG COVADERM 4X10 (GAUZE/BANDAGES/DRESSINGS) ×1 IMPLANT
ELECT REM PT RETURN 9FT ADLT (ELECTROSURGICAL) ×2
ELECTRODE REM PT RTRN 9FT ADLT (ELECTROSURGICAL) ×1 IMPLANT
EVACUATOR SILICONE 100CC (DRAIN) IMPLANT
EXTRACTOR VACUUM M CUP 4 TUBE (SUCTIONS) IMPLANT
GAUZE SPONGE 4X4 12PLY STRL LF (GAUZE/BANDAGES/DRESSINGS) ×4 IMPLANT
GLOVE BIO SURGEON STRL SZ7 (GLOVE) ×2 IMPLANT
GLOVE BIOGEL PI IND STRL 7.0 (GLOVE) ×2 IMPLANT
GLOVE BIOGEL PI INDICATOR 7.0 (GLOVE) ×2
GOWN PREVENTION PLUS LG XLONG (DISPOSABLE) ×6 IMPLANT
KIT ABG SYR 3ML LUER SLIP (SYRINGE) IMPLANT
NDL HYPO 25X5/8 SAFETYGLIDE (NEEDLE) ×1 IMPLANT
NEEDLE HYPO 25X5/8 SAFETYGLIDE (NEEDLE) ×2 IMPLANT
NS IRRIG 1000ML POUR BTL (IV SOLUTION) ×2 IMPLANT
PACK C SECTION WH (CUSTOM PROCEDURE TRAY) ×2 IMPLANT
PAD ABD 7.5X8 STRL (GAUZE/BANDAGES/DRESSINGS) ×1 IMPLANT
RTRCTR C-SECT PINK 25CM LRG (MISCELLANEOUS) ×2 IMPLANT
SLEEVE SCD COMPRESS KNEE MED (MISCELLANEOUS) IMPLANT
STAPLER VISISTAT 35W (STAPLE) IMPLANT
STRIP CLOSURE SKIN 1/2X4 (GAUZE/BANDAGES/DRESSINGS) ×1 IMPLANT
SUT VIC AB 0 CT1 27 (SUTURE) ×4
SUT VIC AB 0 CT1 27XBRD ANBCTR (SUTURE) IMPLANT
SUT VIC AB 0 CT1 36 (SUTURE) ×2 IMPLANT
SUT VIC AB 0 CTX 36 (SUTURE) ×10
SUT VIC AB 0 CTX36XBRD ANBCTRL (SUTURE) ×5 IMPLANT
SUT VIC AB 3-0 CT1 27 (SUTURE) ×2
SUT VIC AB 3-0 CT1 TAPERPNT 27 (SUTURE) IMPLANT
SUT VIC AB 4-0 KS 27 (SUTURE) ×1 IMPLANT
TOWEL OR 17X24 6PK STRL BLUE (TOWEL DISPOSABLE) ×4 IMPLANT
TRAY FOLEY CATH 14FR (SET/KITS/TRAYS/PACK) ×2 IMPLANT
WATER STERILE IRR 1000ML POUR (IV SOLUTION) ×2 IMPLANT

## 2012-06-28 NOTE — MAU Note (Signed)
Skin to skin discussed with pt and spouse.  purell wipes and clothing given to spouse

## 2012-06-28 NOTE — Anesthesia Procedure Notes (Addendum)
Epidural   Epidural Patient location during procedure: OB  Preanesthetic Checklist Completed: patient identified, site marked, surgical consent, pre-op evaluation, timeout performed, IV checked, risks and benefits discussed and monitors and equipment checked  Epidural Patient position: sitting Prep: site prepped and draped and DuraPrep Patient monitoring: continuous pulse ox and blood pressure Approach: midline Injection technique: LOR air  Needle:  Needle type: Tuohy  Needle gauge: 17 G Needle length: 9 cm Needle insertion depth: 9 cm Catheter type: closed end flexible Catheter size: 19 Gauge Catheter at skin depth: 15 cm Test dose: negative  Assessment Events: blood not aspirated, injection not painful, no injection resistance, negative IV test and no paresthesia  Additional Notes (-) asp Heme

## 2012-06-28 NOTE — MAU Note (Signed)
Left sided pain woke her during the night, not as bad now.  Feeling cramps, mainly in the back.  Would like to have a vag delivery.

## 2012-06-28 NOTE — Anesthesia Preprocedure Evaluation (Addendum)
Anesthesia Evaluation  Patient identified by MRN, date of birth, ID band Patient awake    Reviewed: Allergy & Precautions, H&P , Patient's Chart, lab work & pertinent test results  Airway Mallampati: III TM Distance: >3 FB Neck ROM: full    Dental No notable dental hx.    Pulmonary  breath sounds clear to auscultation  Pulmonary exam normal       Cardiovascular Exercise Tolerance: Good hypertension, Rhythm:regular Rate:Normal     Neuro/Psych    GI/Hepatic   Endo/Other  Morbid obesity  Renal/GU      Musculoskeletal   Abdominal   Peds  Hematology   Anesthesia Other Findings Now with PIH, Nl Plts. Hx of C/S prior  Reproductive/Obstetrics                           Anesthesia Physical Anesthesia Plan  ASA: III and Emergent  Anesthesia Plan: Spinal and Epidural   Post-op Pain Management:    Induction:   Airway Management Planned:   Additional Equipment:   Intra-op Plan:   Post-operative Plan:   Informed Consent: I have reviewed the patients History and Physical, chart, labs and discussed the procedure including the risks, benefits and alternatives for the proposed anesthesia with the patient or authorized representative who has indicated his/her understanding and acceptance.   Dental Advisory Given  Plan Discussed with: CRNA  Anesthesia Plan Comments: (Lab work confirmed with CRNA in room. Platelets okay. Discussed spinal anesthetic, and patient consents to the procedure:  included risk of possible headache,backache, failed block, allergic reaction, and nerve injury. This patient was asked if she had any questions or concerns before the procedure started. )       Anesthesia Quick Evaluation

## 2012-06-28 NOTE — MAU Note (Signed)
anes and central nursery notified of pending c/s

## 2012-06-28 NOTE — MAU Note (Signed)
Dr. Adrian Blackwater in the room with the patient and her partner. Detailed explanation of benefits and risks of both repeat cesarean and vaginal delivery given. Patient given time to discuss with partner.

## 2012-06-28 NOTE — MAU Note (Signed)
Clipper prep and chg scrub performed.   No metal on.

## 2012-06-28 NOTE — Progress Notes (Signed)
Patient had moderate amount of bleeding with 2 clots passed at 1730, uterus massaged and firm, no gushing when pushing on uterus at this time, pad changed. At 1830 patient states she felt a gush, moderate/large amount of bleeding through blue pad and onto bed pad, no clots. [facility] RN called and at bedside along with 2 other RN's to evaluate patient for PPH. Gush of blood noted when uterus massaged, no clots, firm uterus. Pad changed. MD on call and Resident on call notified and both are in delivery at this time but will either call or come when finished there. Patient told to notify RN if she feels "gush" again.

## 2012-06-28 NOTE — Progress Notes (Signed)
Epidural catheter d/ced intact without difficulty.  Site unremarkable.  Pt tolerated well.

## 2012-06-28 NOTE — Op Note (Signed)
[facility] PROCEDURE DATE: 06/28/2012  PREOPERATIVE DIAGNOSIS: Intrauterine pregnancy at  [redacted]w[redacted]d weeks gestation; preeclampsia, previous cesarean section  POSTOPERATIVE DIAGNOSIS: The same  PROCEDURE: Repeat Low Transverse Cesarean Section  SURGEON:  Dr. Willodean Rosenthal  ASSISTANT: Dr Candelaria Celeste  INDICATIONS: Anne Richardson is a 26 y.o. G3P1011 at [redacted]w[redacted]d scheduled for cesarean section secondary to preeclampsia, previous cesarean section.  The risks of cesarean section discussed with the patient included but were not limited to: bleeding which may require transfusion or reoperation; infection which may require antibiotics; injury to bowel, bladder, ureters or other surrounding organs; injury to the fetus; need for additional procedures including hysterectomy in the event of a life-threatening hemorrhage; placental abnormalities wth subsequent pregnancies, incisional problems, thromboembolic phenomenon and other postoperative/anesthesia complications. The patient concurred with the proposed plan, giving informed written consent for the procedure.    FINDINGS:  Viable female infant in cephalic presentation.  Apgars 8 and 9, weight pending.  Clear amniotic fluid.  Intact placenta, three vessel cord.  Normal uterus, fallopian tubes and ovaries bilaterally.  ANESTHESIA:    Spinal INTRAVENOUS FLUIDS:1400 ml ESTIMATED BLOOD LOSS: 800 ml URINE OUTPUT:  200 ml SPECIMENS: Placenta sent to L&D COMPLICATIONS: None immediate  PROCEDURE IN DETAIL:  The patient received intravenous antibiotics and had sequential compression devices applied to her lower extremities while in the preoperative area.  She was then taken to the operating room where spinal anesthesia was administered and was found to be adequate. She was then placed in a dorsal supine position with a leftward tilt, and prepped and draped in a sterile manner.  A foley catheter was placed into her bladder and attached to constant  gravity, which drained clear fluid throughout.  After an adequate timeout was performed, a Pfannenstiel skin incision was made with scalpel and carried through to the underlying layer of fascia. The fascia was incised in the midline and this incision was extended bilaterally using the Mayo scissors. Kocher clamps were applied to the superior aspect of the fascial incision and the underlying rectus muscles were dissected off bluntly. A similar process was carried out on the inferior aspect of the facial incision. The rectus muscles were separated in the midline bluntly and the peritoneum was entered bluntly. Attention was turned to the lower uterine segment where a transverse hysterotomy was made with a scalpel and extended bilaterally bluntly. The bladder blade was then removed. The infant was successfully delivered, and cord was clamped and cut and infant was handed over to awaiting neonatology team. Uterine massage was then administered and the placenta delivered intact with three-vessel cord. The uterus was then cleared of clot and debris.  The hysterotomy was closed with 0 Vicryl in a running locked fashion. Overall, excellent hemostasis was noted. The abdomen and the pelvis were cleared of all clot and debris. Hemostasis was confirmed on all surfaces.  The rectus muscles and peritoneum were reapproximated using a 0 vicryl stitch. The fascia was then closed using 0 Vicryl in a running fashion.  The subcutaneous layer was reapproximated with 3-0 Vicryl and the skin was closed with 4-0 Vicryl. The patient tolerated the procedure well. Sponge, lap, instrument and needle counts were correct x 2. She was taken to the recovery room in stable condition.    Deasiah Hagberg JEHIEL DO 06/28/2012 1:32 PM

## 2012-06-28 NOTE — Transfer of Care (Signed)
Immediate Anesthesia Transfer of Care Note  Patient: Anne Richardson  Procedure(s) Performed: Procedure(s) (LRB): CESAREAN SECTION (N/A)  Patient Location: PACU  Anesthesia Type: Epidural  Level of Consciousness: awake  Airway & Oxygen Therapy: Patient Spontanous Breathing  Post-op Assessment: Report given to PACU RN and Post -op Vital signs reviewed and stable  Post vital signs: Reviewed and stable  Complications: No apparent anesthesia complications

## 2012-06-28 NOTE — Anesthesia Postprocedure Evaluation (Signed)
  Anesthesia Post-op Note  Patient: Anne Richardson  Procedure(s) Performed: Procedure(s) (LRB): CESAREAN SECTION (N/A)  Patient Location: PACU  Anesthesia Type: Spinal and Epidural  Level of Consciousness: awake, alert  and oriented  Airway and Oxygen Therapy: Patient Spontanous Breathing  Post-op Pain: none  Post-op Assessment: Post-op Vital signs reviewed, Patient's Cardiovascular Status Stable, Respiratory Function Stable, Patent Airway, No signs of Nausea or vomiting, Pain level controlled, No headache and No backache  Post-op Vital Signs: Reviewed and stable  Complications: No apparent anesthesia complications

## 2012-06-28 NOTE — MAU Note (Signed)
Left side & lower abdominal cramping since 0130 this morning. Denies vaginal bleeding, leaking of fluid or vaginal discharge. Decreased fetal movement.

## 2012-06-28 NOTE — H&P (Signed)
Anne Richardson is a 26 y.o. female [phone] with IUP at [redacted]w[redacted]d presenting with left quadrant pain and found to have mild pre-eclampsia. Pt states she has been having irregular contractions, no vaginal bleeding, intact membranes, with active fetal movements.    Prenatal Course Source of Care: [facility] with onset of care at 9 weeks Pregnancy complications or risks: Patient Active Problem List  Diagnosis  . Pregnancy test-positive  . Hyperemesis gravidarum  . Obesity  . History of cesarean section  . Uterine size date discrepancy pregnancy, third trimester  . Supervision of normal pregnancy   She desires to Depo-Provera.  She plans to plans to bottle feed  Prenatal labs and studies: ABO, Rh: B/POS/-- (12/14 1340) Antibody: NEG (12/14 1340) Rubella:  immune RPR: NON REAC (06/04 1107)  HBsAg: NEGATIVE (12/14 1340)  HIV: NON REACTIVE (06/04 1107)  GBS:   unknown 1 hr Glucola 137 Genetic screening not done. Anatomy US normal  Past Medical History:  Past Medical History  Diagnosis Date  . Pregnancy induced hypertension     Past Surgical History:  Past Surgical History  Procedure Date  . Cesarean section   . Breast lumpectomy     left  . Biopsy breast     Obstetrical History:  OB History    Grav Para Term Preterm Abortions TAB SAB Ect Mult Living   3 1 1  1  0 1   1      Gynecological History:  OB History    Grav Para Term Preterm Abortions TAB SAB Ect Mult Living   3 1 1  1  0 1   1      Social History:  History   Social History  . Marital Status: Single    Spouse Name: N/A    Number of Children: N/A  . Years of Education: N/A   Social History Main Topics  . Smoking status: Never Smoker   . Smokeless tobacco: None  . Alcohol Use: No  . Drug Use: Yes    Special: Marijuana  . Sexually Active: Yes    Birth Control/ Protection: None   Other Topics Concern  . 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 Cancer Mother 67    breast  . Alcohol abuse Mother   . Depression Mother   . Hypertension Mother   . Depression Sister   . Hypertension Sister   . Asthma Brother   . Depression Brother   . Hypertension Brother   . Alcohol abuse Maternal Aunt   . Hypertension Maternal Aunt   . Cancer Maternal Aunt 65    breast ca  . Alcohol abuse Maternal Uncle   . Hypertension Maternal Uncle   . Alzheimer's disease Maternal Grandmother   . Heart disease Maternal Grandmother   . Alcohol abuse Maternal Grandmother   . Hypertension Maternal Grandmother   . Heart disease Maternal Grandfather   . Alcohol abuse Maternal Grandfather   . Hypertension Maternal Grandfather   . Cancer Maternal Grandfather     prostate and bone  . Depression Paternal Grandfather     Medications:  Prenatal vitamins,  No current facility-administered medications for this encounter.    Allergies: No Known Allergies  Review of Systems: - Negative  Physical Exam: Blood pressure 132/66,  pulse 111, temperature 98.5 F (36.9 C), temperature source Oral, resp. rate 20, height 5\' 6"  (1.676 m), weight 144.335 kg (318 lb 3.2 oz), last menstrual period 09/06/2011, unknown if currently breastfeeding. GENERAL: Well-developed, well-nourished female in no acute distress.  LUNGS: Clear to auscultation bilaterally.  HEART: Regular rate and rhythm. ABDOMEN: Soft, nontender, nondistended, gravid. EFW 8-8.5 lbs EXTREMITIES: Nontender, no edema, 2+ distal pulses.    Pertinent Labs/Studies:    Assessment : Anne Richardson is a 26 y.o. G3P1011 at [redacted]w[redacted]d being admitted for mild pre-eclampsia  Plan: Counseled patient regarding risks of trial of labor vs cesarean section.  Patient has opted to have cesarean delivery.  The risks of cesarean section discussed  with the patient included but were not limited to: bleeding which may require transfusion or reoperation; infection which may require antibiotics; injury to bowel, bladder, ureters or other surrounding organs; injury to the fetus; need for additional procedures including hysterectomy in the event of a life-threatening hemorrhage; placental abnormalities wth subsequent pregnancies, incisional problems, thromboembolic phenomenon and other postoperative/anesthesia complications. The patient concurred with the proposed plan, giving informed written consent for the procedure.   Patient has been NPO since 11pm she will remain NPO for procedure. Anesthesia and OR aware.  Preoperative prophylactic Ancef ordered on call to the OR.  To OR when ready.  Will treat the patient with magnesium sulfate following delivery for seizure prophylaxis.   STINSON, Anne Richardson 06/28/2012, 10:02 AM

## 2012-06-29 ENCOUNTER — Encounter (HOSPITAL_COMMUNITY): Payer: Self-pay | Admitting: Obstetrics & Gynecology

## 2012-06-29 LAB — CBC
HCT: 25.8 % — ABNORMAL LOW (ref 36.0–46.0)
MCH: 24.8 pg — ABNORMAL LOW (ref 26.0–34.0)
MCHC: 31 g/dL (ref 30.0–36.0)
MCV: 80.1 fL (ref 78.0–100.0)
RDW: 15.9 % — ABNORMAL HIGH (ref 11.5–15.5)

## 2012-06-29 MED ORDER — MAGNESIUM SULFATE 40 G IN LACTATED RINGERS - SIMPLE
2.0000 g/h | INTRAVENOUS | Status: DC
  Filled 2012-06-29: qty 500

## 2012-06-29 NOTE — Anesthesia Postprocedure Evaluation (Signed)
  Anesthesia Post-op Note  Patient: Anne Richardson  Procedure(s) Performed: Procedure(s) (LRB): CESAREAN SECTION (N/A)  Patient Location: A-ICU  Anesthesia Type: Epidural  Level of Consciousness: awake  Airway and Oxygen Therapy: Patient Spontanous Breathing  Post-op Pain: none  Post-op Assessment: Patient's Cardiovascular Status Stable, Respiratory Function Stable, Patent Airway, No signs of Nausea or vomiting, Adequate PO intake, Pain level controlled, No headache, No backache, No residual numbness and No residual motor weakness  Post-op Vital Signs: Reviewed and stable  Complications: No apparent anesthesia complications

## 2012-06-29 NOTE — Progress Notes (Signed)
UR chart review completed.  

## 2012-06-29 NOTE — Progress Notes (Signed)
Report to E. Toms, RN re pt transfer.  Ambulatory to 318 at 2146.

## 2012-06-29 NOTE — Progress Notes (Signed)
Day 1 Post op CESAREAN SECTION (N/A)  Subjective: Patient reports tolerating some PO. Reports continued vaginal bleeding but is decreased. Pt describes sensation of a "gush' occasionally. Denies incisional pain or other discomfort. Pt reports she has been out of bed once with assistance and felt very weak.    Objective: I have reviewed patient's vital signs and medications.   General: alert, cooperative and no distress GI: normal findings: bowel sounds normal and soft, non-tender Vaginal Bleeding: moderate Uterus is firm.  Assessment: s/p Procedure(s) (LRB): CESAREAN SECTION (N/A): stable  Plan: maintain current plan Complete cycle of magnesium - started 06/28/12 at 1345   LOS: 1 day    Golden Circle 06/29/2012, 7:52 AM     BP good, UOP adequate.  Lochia WNL. Dressing is clean and dry.  Hgb is 8 today.  Pt. Seen and examined by myself.  Agree with above note.  Bottle feeding.  Magnesium x 24 hours post delivery and then transfer to the floor.

## 2012-06-29 NOTE — Addendum Note (Signed)
Addendum  created 06/29/12 1025 by Suella Grove, CRNA   Modules edited:Notes Section

## 2012-06-30 ENCOUNTER — Encounter: Payer: Self-pay | Admitting: Family Medicine

## 2012-06-30 LAB — TYPE AND SCREEN
ABO/RH(D): B POS
Antibody Screen: NEGATIVE
Unit division: 0
Unit division: 0

## 2012-06-30 MED ORDER — OXYCODONE-ACETAMINOPHEN 5-325 MG PO TABS: 1.0000 | ORAL_TABLET | ORAL | Status: AC | PRN

## 2012-06-30 MED ORDER — IBUPROFEN 600 MG PO TABS: 600.0000 mg | ORAL_TABLET | Freq: Four times a day (QID) | ORAL | Status: DC | PRN

## 2012-06-30 NOTE — Discharge Summary (Signed)
Obstetric Discharge Summary Reason for Admission: Admitted at 38w5k for mild pre-eclampsia and onset of labor Prenatal Procedures: Preeclampsia Intrapartum Procedures: Repeat cesarean: low cervical, transverse  Postpartum Procedures: none Complications-Operative and Postpartum: P.P. eclampsia Hemoglobin  Date Value Range Status  06/29/2012 8.0* 12.0 - 15.0 g/dL Final     DELTA CHECK NOTED     REPEATED TO VERIFY     HCT  Date Value Range Status  06/29/2012 25.8* 36.0 - 46.0 % Final    Physical Exam:  General: alert, cooperative and no distress Lochia: appropriate Uterine Fundus: firm Incision: no significant drainage DVT Evaluation: No evidence of DVT seen on physical exam. Negative Homan's sign. No cords or calf tenderness. No significant calf/ankle edema.  Discharge Diagnoses: Preelampsia  Discharge Information: Date: 06/30/2012 Activity: pelvic rest Diet: routine Medications: Colace, Iron and Percocet Condition: stable Instructions: refer to practice specific booklet Discharge to: home   Newborn Data: Live born female  Birth Weight: 9 lb 14.1 oz (4483 g) APGAR: 8, 9  Home with mother.  [place] Circle 06/30/2012, 8:43 AM  Vitals normal, incision looks good. D/C today, f/u at [facility] with Dr. Clinton Sawyer in 6 weeks.  ARNOLD,JAMES 06/30/2012 9:12 AM

## 2012-07-06 NOTE — Op Note (Signed)
Anne Richardson, Anne Richardson

## 2012-07-09 ENCOUNTER — Inpatient Hospital Stay (HOSPITAL_COMMUNITY)
Admission: AD | Admit: 2012-07-09 | Discharge: 2012-07-09 | Disposition: A | Discharge status: Home / Self Care | Payer: Self-pay | Source: Ambulatory Visit | Attending: Obstetrics & Gynecology | Admitting: Obstetrics & Gynecology

## 2012-07-09 ENCOUNTER — Encounter (HOSPITAL_COMMUNITY): Payer: Self-pay | Admitting: Obstetrics and Gynecology

## 2012-07-09 DIAGNOSIS — M940 Chondrocostal junction syndrome [Tietze]: Secondary | ICD-10-CM

## 2012-07-09 DIAGNOSIS — R071 Chest pain on breathing: Secondary | ICD-10-CM | POA: Insufficient documentation

## 2012-07-09 DIAGNOSIS — O99893 Other specified diseases and conditions complicating puerperium: Secondary | ICD-10-CM | POA: Insufficient documentation

## 2012-07-09 MED ORDER — NAPROXEN SODIUM 550 MG PO TABS
550.0000 mg | ORAL_TABLET | Freq: Once | ORAL | Status: DC
  Filled 2012-07-09: qty 1

## 2012-07-09 MED ORDER — NAPROXEN SODIUM 550 MG PO TABS: 550.0000 mg | ORAL_TABLET | Freq: Two times a day (BID) | ORAL | Status: DC | PRN

## 2012-07-09 NOTE — MAU Provider Note (Signed)
Attestation of Attending Supervision of Resident: Evaluation and management procedures were performed by the [facility] Resident under my supervision.  I have seen and examined the patient, reviewed the resident's note and chart, and I agree with the management and plan.  Anibal Henderson, M.D. 07/09/2012 3:11 PM

## 2012-07-09 NOTE — MAU Provider Note (Signed)
History   CSN: [id number]  Arrival date and time: 07/09/12 1319   First Provider Initiated Contact with Patient 07/09/12 1401    Chief Complaint  Patient presents with  . Pleurisy  . Shortness of Breath   HPI: [name] is a [id number] who underwent a repeat C/S at [redacted] weeks gestation on 06/28/12.  She has been fairly sedentary since that time, but has been lifting her newborn and occasionally her 49-month-old daughter.  She has been feeling fairly well until last night at 2200 when she suddenly developed sharp, R-sided pleuritic chest pain.  It is worse when she takes a deep breath and when she pushes on her chest, twists, or lifts/moves her R arm.  She cannot take a deep breath because of the pain but does not feel truly SOB (she slept fine last night).  She has been eating normally, but did have to stop and catch her breath a time or 2.  She denies increased leg swelling and leg pain.  OB History    Grav Para Term Preterm Abortions TAB SAB Ect Mult Living   3 2 2  1  0 1   2      Past Medical History  Diagnosis Date  . Pregnancy induced hypertension     Past Surgical History  Procedure Date  . Cesarean section   . Breast lumpectomy     left  . Biopsy breast   . Cesarean section 06/28/2012    Procedure: CESAREAN SECTION;  Surgeon: Lesly Dukes, MD;  Location: [facility];  Service: Gynecology;  Laterality: N/A;    Family History  Problem Relation Age of Onset  . Cancer Mother 47    breast  . Alcohol abuse Mother   . Depression Mother   . Hypertension Mother   . Depression Sister   . Hypertension Sister   . Asthma Brother   . Depression Brother   . Hypertension Brother   . Alcohol abuse Maternal Aunt   . Hypertension Maternal Aunt   . Cancer Maternal Aunt 65    breast ca  . Alcohol abuse Maternal Uncle   . Hypertension Maternal Uncle   . Alzheimer's disease Maternal Grandmother   . Heart disease Maternal Grandmother   . Alcohol abuse Maternal Grandmother   .  Hypertension Maternal Grandmother   . Heart disease Maternal Grandfather   . Alcohol abuse Maternal Grandfather   . Hypertension Maternal Grandfather   . Cancer Maternal Grandfather     prostate and bone  . Depression Paternal Grandfather    Maternal cousin had a PE around age 45 and was on blood thinners - pt unsure if cousin on hormonal contraceptive.  History  Substance Use Topics  . Smoking status: Never Smoker   . Smokeless tobacco: Not on file  . Alcohol Use: No    Allergies: No Known Allergies  Prescriptions prior to admission  Medication Sig Dispense Refill  . ibuprofen (ADVIL,MOTRIN) 600 MG tablet Take 1 tablet (600 mg total) by mouth every 6 (six) hours as needed.  30 tablet  1  . oxyCODONE-acetaminophen (PERCOCET) 5-325 MG per tablet Take 1-2 tablets by mouth every 3 (three) hours as needed (moderate - severe pain).  30 tablet  0    Review of Systems  Constitutional: Negative for fever, chills, malaise/fatigue and diaphoresis.  Eyes: Negative for blurred vision.  Respiratory: Negative for cough, hemoptysis, sputum production and wheezing.   Cardiovascular: Positive for chest pain and orthopnea. Negative for palpitations, leg swelling  and PND.  Gastrointestinal: Negative for vomiting and diarrhea.  Genitourinary: Negative for dysuria.  Skin: Negative for itching and rash.  Neurological: Negative for dizziness and headaches.   Physical Exam   Blood pressure 122/58, pulse 99, temperature 98.6 F (37 C), temperature source Oral, resp. rate 20, last menstrual period 09/06/2011, SpO2 100.00%, not currently breastfeeding.  Pulse ranged from 76 to 99 and RR ranged from 14 to 20 when observed.  Physical Exam  Constitutional: She is oriented to person, place, and time. She appears well-developed and well-nourished.  HENT:  Head: Normocephalic and atraumatic.  Eyes: Conjunctivae are normal.  Neck: Normal range of motion. Neck supple.  Cardiovascular: Normal rate,  regular rhythm and normal heart sounds.   Respiratory: Breath sounds normal. No accessory muscle usage. No respiratory distress. She has no wheezes. She has no rales. She exhibits tenderness (Exquisite tenderness to palpation over R costochondral joints under R breast).       Slightly tachypneic, taking shallow breaths  GI: Soft. Bowel sounds are normal. She exhibits no distension.       Incision is clean but is oozing serous fluid. Mild separation of skin edges but wound is intact overall.  Musculoskeletal:       Pain is significantly increased with movement of R arm/shoulder  Neurological: She is alert and oriented to person, place, and time.  Skin: Skin is warm and dry. She is not diaphoretic.  Psychiatric: She has a normal mood and affect. Her behavior is normal.    MAU Course  Procedures   Assessment and Plan  G3P2 POD#10 after repeat C/S here with pleuritic pain.  However, her pain is exactly reproducible with palpation of her costochondral joints and with R arm movement.  She likely has costochondritis precipitated by picking up her 2 children.  We will treat with a stronger NSAID (d/c ibuprofen and start naproxen), but given her recent surgery, relative immobility, and family history of PE, we have explicitly warned her to return for evaluation if she develops SOB, worsening CP, hemoptysis, or has no improvement with a week of treatment with NSAIDs.  Currently, we feel she is safe for discharge without further workup given her normal SpO2 (maintained for >30 minutes on continuous pulse ox here) and lack of tachycardia and significant tachypnea make PE much less likely.  This patient was also seen and examined by Dr. Erin Fulling, who agrees with the above assessment and plan.  Junious Silk S 07/09/2012, 2:29 PM

## 2012-07-09 NOTE — MAU Note (Signed)
Pt presents to MAU with chief complaint of Shortness of breath and pain in chest on inspiration. Pt is status post C-section 7/9. Pt describes the pain sharp and patient is unable to take a deep breath. Pt says the pain came on suddenly around 2200 and nothing has made it better.

## 2012-07-11 ENCOUNTER — Ambulatory Visit (INDEPENDENT_AMBULATORY_CARE_PROVIDER_SITE_OTHER): Payer: MEDICAID | Admitting: Medical

## 2012-07-11 DIAGNOSIS — Z309 Encounter for contraceptive management, unspecified: Secondary | ICD-10-CM

## 2012-07-11 DIAGNOSIS — Z3049 Encounter for surveillance of other contraceptives: Secondary | ICD-10-CM

## 2012-07-11 MED ORDER — MEDROXYPROGESTERONE ACETATE 150 MG/ML IM SUSP
150.0000 mg | INTRAMUSCULAR | Status: AC
  Administered 2012-07-11: 150 mg via INTRAMUSCULAR

## 2012-07-11 NOTE — Progress Notes (Signed)
Patient ID: Anne Richardson, female   DOB: 1986/04/01, 26 y.o.   MRN: [id number]  Patient presents for first depo provera injection post partum. Discussed patient case with Dr. Macon Large and was told patient was ok to have her first injection administered today as long as she denied intercourse since the birth of her child on 06/28/12. The patient denied any intercourse and was given her injection today. Patient tolerated well and will return in 12 weeks for next injection.   Judith Blonder

## 2012-07-19 ENCOUNTER — Telehealth: Payer: Self-pay | Admitting: *Deleted

## 2012-07-19 NOTE — Telephone Encounter (Signed)
Received message from "Selena Batten" at [employer] [facility] [employer] disability insurance with questions about pt's claim. She stated that pt has informed them that she was taken out of work early due to gestational diabetes. Her last Becca Bayne of work was 04/26/12. Kim wants to know if and when pt was taken out of work due to gestational diabetes.  Claim ID # [id number].  I reviewed chart and returned the call. I spoke w/Steve. I informed him that pt did not receive her prenatal care in our office, however I could answer his questions from the EMR. I informed him of the following: pt was admitted for labor and delivered by c/section on 06/28/12, pt received all prenatal care @ [facility]- tel# provided, pt had poor compliance of prenatal care appts which is well documented (no visits from 3/26-6/4 and multiple calls placed to pt).  There is no reference in her chart that she was taken out of work for medical reasons. There is a letter dated 03/15/12 from Dr. Clinton Sawyer which stated that pt should be on reduced work hours of not more than 30 hours per week. Her last prenatal visit prior to delivery of infant was 05/24/12. Brett Canales had no additional questions.

## 2012-08-03 ENCOUNTER — Ambulatory Visit (INDEPENDENT_AMBULATORY_CARE_PROVIDER_SITE_OTHER): Payer: Medicaid Other | Admitting: Advanced Practice Midwife

## 2012-08-03 ENCOUNTER — Encounter: Payer: Self-pay | Admitting: Advanced Practice Midwife

## 2012-08-03 VITALS — BP 125/87 | HR 93 | Temp 97.3°F | Resp 16

## 2012-08-03 DIAGNOSIS — M5430 Sciatica, unspecified side: Secondary | ICD-10-CM

## 2012-08-03 DIAGNOSIS — M5432 Sciatica, left side: Secondary | ICD-10-CM

## 2012-08-03 DIAGNOSIS — N949 Unspecified condition associated with female genital organs and menstrual cycle: Secondary | ICD-10-CM

## 2012-08-03 DIAGNOSIS — R51 Headache: Secondary | ICD-10-CM

## 2012-08-03 MED ORDER — IBUPROFEN 600 MG PO TABS: 600.0000 mg | ORAL_TABLET | Freq: Four times a day (QID) | ORAL | Status: DC | PRN

## 2012-08-03 NOTE — Patient Instructions (Addendum)
Anne Richardson (Headache specialist)   Hypertension As your heart beats, it forces blood through your arteries. This force is your blood pressure. If the pressure is too high, it is called hypertension (HTN) or high blood pressure. HTN is dangerous because you may have it and not know it. High blood pressure may mean that your heart has to work harder to pump blood. Your arteries may be narrow or stiff. The extra work puts you at risk for heart disease, stroke, and other problems.  Blood pressure consists of two numbers, a higher number over a lower, 110/72, for example. It is stated as "110 over 72." The ideal is below 120 for the top number (systolic) and under 80 for the bottom (diastolic). Write down your blood pressure today. 140/90, 125/87 You should pay close attention to your blood pressure if you have certain conditions such as:  Heart failure.   Prior heart attack.   Diabetes   Chronic kidney disease.   Prior stroke.   Multiple risk factors for heart disease.  To see if you have HTN, your blood pressure should be measured while you are seated with your arm held at the level of the heart. It should be measured at least twice. A one-time elevated blood pressure reading (especially in the Emergency Department) does not mean that you need treatment. There may be conditions in which the blood pressure is different between your right and left arms. It is important to see your caregiver soon for a recheck. Most people have essential hypertension which means that there is not a specific cause. This type of high blood pressure may be lowered by changing lifestyle factors such as:  Stress.   Smoking.   Lack of exercise.   Excessive weight.   Drug/tobacco/alcohol use.   Eating less salt.  Most people do not have symptoms from high blood pressure until it has caused damage to the body. Effective treatment can often prevent, delay or reduce that damage. TREATMENT  When a cause has been  identified, treatment for high blood pressure is directed at the cause. There are a large number of medications to treat HTN. These fall into several categories, and your caregiver will help you select the medicines that are best for you. Medications may have side effects. You should review side effects with your caregiver. If your blood pressure stays high after you have made lifestyle changes or started on medicines,   Your medication(s) may need to be changed.   Other problems may need to be addressed.   Be certain you understand your prescriptions, and know how and when to take your medicine.   Be sure to follow up with your caregiver within the time frame advised (usually within two weeks) to have your blood pressure rechecked and to review your medications.   If you are taking more than one medicine to lower your blood pressure, make sure you know how and at what times they should be taken. Taking two medicines at the same time can result in blood pressure that is too low.  SEEK IMMEDIATE MEDICAL CARE IF:  You develop a severe headache, blurred or changing vision, or confusion.   You have unusual weakness or numbness, or a faint feeling.   You have severe chest or abdominal pain, vomiting, or breathing problems.  MAKE SURE YOU:   Understand these instructions.   Will watch your condition.   Will get help right away if you are not doing well or get worse.  Document Released: 12/07/2005 Document Revised: 11/26/2011 Document Reviewed: 07/27/2008 [facility] Patient Information 2012 [place], [place].

## 2012-08-03 NOTE — Progress Notes (Signed)
Patient ID: Anne Richardson, female   DOB: [birth year], 26 y.o.   MRN: [id number] Pt states has has been taking ibuprofen for headaches- desires refill.

## 2012-08-03 NOTE — Progress Notes (Signed)
  Subjective:     Anne Richardson is a 26 y.o. female who presents for a postpartum visit. She is 4 weeks postpartum following a low cervical transverse Cesarean section. I have fully reviewed the prenatal and intrapartum course. The delivery was at 38.5 gestational weeks. Outcome: repeat cesarean section, low transverse incision. Anesthesia: spinal. Postpartum course has been complicated by "popping sensation in pubic bone and sharp pulling at incision upon standing. Patient is unable to see if the incision is open. She denies seeing bleeding or drainage. Baby's course has been uncomplicated. Baby is feeding by Bottle. Bleeding staining only. Bowel function is normal. Bladder function is normal. Patient is not sexually active. Contraception method is Depo-Provera injections. Postpartum depression screening: negative.  The following portions of the patient's history were reviewed and updated as appropriate: allergies, current medications, past family history, past medical history, past social history, past surgical history and problem list.  Review of Systems Denies fever, chills or abdominal pain.  Reports chronic, intermittent, generalized headaches that resolve with ibuprofen. Denies aura, neck pain, problems with speech or gait. Also reports sharp pain that radiates from back through right hip since delivery.  Objective:    BP 125/87  Pulse 93  Temp 97.3 F (36.3 C) (Oral)  Resp 16  Breastfeeding? No  General:  alert, cooperative, no distress and morbidly obese   Breasts:  . deferred   Lungs: clear to auscultation bilaterally  Heart:  regular rate and rhythm, S1, S2 normal, no murmur, click, rub or gallop  Abdomen: soft, non-tender; bowel sounds normal; no masses,  no organomegaly and Incision well-healed except for two 1-2 mm areas of granulation tissue where the patient may have had a small dehiscence that healed by secondary intention. No drainage, bleeding, erythema, warmth or  tenderness. Moderate symphysis pubis tenderness. Crepitus not reproducible.    Vulva:  normal  Vagina: normal vagina  Cervix:  no cervical motion tenderness  Corpus: Unable to assess due to body habitus.  Adnexa: Extremities  no mass, fullness, tenderness  Equal strength bilaterally. Normal range of motion. Deep tendon reflexes 2+. Mild tenderness in right hip.   Rectal Exam: Not performed.        Assessment:   1. Pain of female symphysis pubis   2. Chronic headache   3. Sciatica of left side    Plan:    1. Contraception: Depo-Provera injections 2. referred to [facility] barefoot for headache management 3. Follow up in: 2 months for Depo-Provera or as needed.  4. discuss comfort measures for pubic bone pain and instability and sciatica. Symptoms will likely improve spontaneously in 1-2 months. Refilled ibuprofen.  [place], CNM 08/03/2012 4:02 PM

## 2012-09-26 ENCOUNTER — Ambulatory Visit: Payer: MEDICAID

## 2013-01-16 ENCOUNTER — Emergency Department (HOSPITAL_COMMUNITY)
Admission: EM | Admit: 2013-01-16 | Discharge: 2013-01-16 | Disposition: A | Discharge status: Home / Self Care | Payer: Self-pay | Attending: Emergency Medicine | Admitting: Emergency Medicine

## 2013-01-16 ENCOUNTER — Encounter (HOSPITAL_COMMUNITY): Payer: Self-pay | Admitting: Emergency Medicine

## 2013-01-16 DIAGNOSIS — K0381 Cracked tooth: Secondary | ICD-10-CM | POA: Insufficient documentation

## 2013-01-16 DIAGNOSIS — Z79899 Other long term (current) drug therapy: Secondary | ICD-10-CM | POA: Insufficient documentation

## 2013-01-16 DIAGNOSIS — Z8679 Personal history of other diseases of the circulatory system: Secondary | ICD-10-CM | POA: Insufficient documentation

## 2013-01-16 DIAGNOSIS — K0889 Other specified disorders of teeth and supporting structures: Secondary | ICD-10-CM

## 2013-01-16 MED ORDER — HYDROCODONE-ACETAMINOPHEN 5-325 MG PO TABS: 2.0000 | ORAL_TABLET | Freq: Four times a day (QID) | ORAL | Status: DC | PRN

## 2013-01-16 MED ORDER — PENICILLIN V POTASSIUM 500 MG PO TABS: 500.0000 mg | ORAL_TABLET | Freq: Four times a day (QID) | ORAL | Status: AC

## 2013-01-16 NOTE — ED Provider Notes (Signed)
History  This chart was scribed for non-physician practitioner working with Anne Lennert, MD by Ardeen Jourdain, ED Scribe. This patient was seen in room WTR6/WTR6 and the patient's care was started at 1811.  CSN: [id number]  Arrival date & time 01/16/13  1735   First MD Initiated Contact with Patient 01/16/13 1811      Chief Complaint  Patient presents with  . broken tooth      Patient is a 27 y.o. female presenting with tooth pain. The history is provided by the patient. No language interpreter was used.  Dental PainThe primary symptoms include mouth pain and dental injury. Primary symptoms do not include oral bleeding, oral lesions, headaches, fever, shortness of breath, sore throat, angioedema or cough. The symptoms began 3 to 5 days ago. The symptoms are worsening. The symptoms are new. The symptoms occur constantly.  The dental injury occurred more than 1 month ago. Affected teeth include: 2/right upper second molar. The injury is a chip.  Additional symptoms include: dental sensitivity to temperature, gum swelling and gum tenderness. Additional symptoms do not include: purulent gums, trismus, jaw pain, facial swelling, trouble swallowing, pain with swallowing, excessive salivation, dry mouth, taste disturbance, smell disturbance, drooling, ear pain, hearing loss, nosebleeds, swollen glands, goiter and fatigue. Medical issues do not include: periodontal disease.    Anne Richardson is a 27 y.o. female who presents to the Emergency Department complaining of right upper molar pain that began 2-3 days ago. She reports the pain is aggravated by a change in temperature. She reports she previously broke her tooth a few months ago, but that it was not bothering her until a few days ago. She states she was eating "hamburger helper" when another piece of her tooth broke off.  She states she can no longer tolerate the pain.    Past Medical History  Diagnosis Date  . Pregnancy induced  hypertension     Past Surgical History  Procedure Date  . Cesarean section   . Breast lumpectomy     left  . Biopsy breast   . Cesarean section 06/28/2012    Procedure: CESAREAN SECTION;  Surgeon: Lesly Dukes, MD;  Location: [facility];  Service: Gynecology;  Laterality: N/A;  . Wisdom tooth extraction     age 78    Family History  Problem Relation Age of Onset  . Cancer Mother 2    breast  . Alcohol abuse Mother   . Depression Mother   . Hypertension Mother   . Depression Sister   . Hypertension Sister   . Asthma Brother   . Depression Brother   . Hypertension Brother   . Alcohol abuse Maternal Aunt   . Hypertension Maternal Aunt   . Cancer Maternal Aunt 65    breast ca  . Alcohol abuse Maternal Uncle   . Hypertension Maternal Uncle   . Alzheimer's disease Maternal Grandmother   . Heart disease Maternal Grandmother   . Alcohol abuse Maternal Grandmother   . Hypertension Maternal Grandmother   . Heart disease Maternal Grandfather   . Alcohol abuse Maternal Grandfather   . Hypertension Maternal Grandfather   . Cancer Maternal Grandfather     prostate and bone  . Depression Paternal Grandfather     History  Substance Use Topics  . Smoking status: Never Smoker   . Smokeless tobacco: Not on file  . Alcohol Use: No    OB History    Grav Para Term Preterm Abortions TAB SAB  Ect Mult Living   3 2 2  1  0 1   2      Review of Systems  Constitutional: Negative for fever, chills and fatigue.  HENT: Positive for dental problem. Negative for hearing loss, ear pain, nosebleeds, sore throat, facial swelling, drooling and trouble swallowing.   Respiratory: Negative for cough and shortness of breath.   Neurological: Negative for headaches.  All other systems reviewed and are negative.    Allergies  Review of patient's allergies indicates no known allergies.  Home Medications   Current Outpatient Rx  Name  Route  Sig  Dispense  Refill  . ACETAMINOPHEN 500 MG PO  TABS   Oral   Take 1,000 mg by mouth every 6 (six) hours as needed. For pain.         Marland Kitchen MEDROXYPROGESTERONE ACETATE 150 MG/ML IM SUSP   Intramuscular   Inject 150 mg into the muscle every 3 (three) months.           Triage Vitals: BP 132/72  Pulse 83  Temp 99 F (37.2 C) (Oral)  Resp 14  Ht 5\' 6"  (1.676 m)  Wt 260 lb (117.935 kg)  BMI 41.97 kg/m2  SpO2 98%  Breastfeeding? No  Physical Exam  Nursing note and vitals reviewed. Constitutional: She is oriented to person, place, and time. She appears well-developed and well-nourished. No distress.  HENT:  Head: Normocephalic and atraumatic. No trismus in the jaw.  Mouth/Throat: Uvula is midline, oropharynx is clear and moist and mucous membranes are normal. Abnormal dentition. No dental abscesses or uvula swelling. No oropharyngeal exudate, posterior oropharyngeal edema, posterior oropharyngeal erythema or tonsillar abscesses.       Poor dental hygiene. Pt able to open and close mouth with out difficulty. Airway intact. Uvula midline. Mild gingival swelling with tenderness over affected area, but no fluctuance. No swelling or tenderness of submental and submandibular regions.  Right upper molar tooth fractured with filling exposed.  No exposed pulp or dentin visualized.    Eyes: Conjunctivae normal and EOM are normal.  Neck: Normal range of motion and full passive range of motion without pain. Neck supple.  Cardiovascular: Normal rate and regular rhythm.   Pulmonary/Chest: Effort normal and breath sounds normal. No respiratory distress. She has no wheezes.  Musculoskeletal: Normal range of motion.  Lymphadenopathy:       Head (right side): No submental, no submandibular, no tonsillar, no preauricular and no posterior auricular adenopathy present.       Head (left side): No submental, no submandibular, no tonsillar, no preauricular and no posterior auricular adenopathy present.    She has no cervical adenopathy.  Neurological: She  is alert and oriented to person, place, and time.  Skin: Skin is warm and dry. No rash noted. She is not diaphoretic.    ED Course  Procedures (including critical care time)  DIAGNOSTIC STUDIES: Oxygen Saturation is 98% on room air, normal by my interpretation.    COORDINATION OF CARE:  6:41 PM: Discussed treatment plan which includes referral to a dentist with pt at bedside and pt agreed to plan.     Labs Reviewed - No data to display No results found.   No diagnosis found.    MDM  Patient with toothache.  No gross abscess.  Exam unconcerning for Ludwig's angina or spread of infection.  Will treat with penicillin and pain medicine.  Urged patient to follow-up with dentist.    I personally performed the services described in this  documentation, which was scribed in my presence. The recorded information has been reviewed and is accurate.    Pascal Lux [place], PA-C 01/17/13 1441

## 2013-01-16 NOTE — ED Notes (Signed)
Patient states her tooth has been "broken" for awhile. Patient sates some more broke off a couple days ago and she can not tolerate the pain any longer. Upper right molar, next to last.

## 2013-01-17 NOTE — ED Provider Notes (Signed)
Medical screening examination/treatment/procedure(s) were performed by non-physician practitioner and as supervising physician I was immediately available for consultation/collaboration.   Alando Colleran L Melesio Madara, MD 01/17/13 1526 

## 2013-03-15 ENCOUNTER — Emergency Department (HOSPITAL_COMMUNITY)
Admission: EM | Admit: 2013-03-15 | Discharge: 2013-03-15 | Disposition: A | Discharge status: Home / Self Care | Payer: Self-pay | Attending: Emergency Medicine | Admitting: Emergency Medicine

## 2013-03-15 ENCOUNTER — Encounter (HOSPITAL_COMMUNITY): Payer: Self-pay | Admitting: *Deleted

## 2013-03-15 DIAGNOSIS — Z79899 Other long term (current) drug therapy: Secondary | ICD-10-CM | POA: Insufficient documentation

## 2013-03-15 DIAGNOSIS — J069 Acute upper respiratory infection, unspecified: Secondary | ICD-10-CM | POA: Insufficient documentation

## 2013-03-15 DIAGNOSIS — R1310 Dysphagia, unspecified: Secondary | ICD-10-CM | POA: Insufficient documentation

## 2013-03-15 DIAGNOSIS — R51 Headache: Secondary | ICD-10-CM | POA: Insufficient documentation

## 2013-03-15 DIAGNOSIS — J3489 Other specified disorders of nose and nasal sinuses: Secondary | ICD-10-CM | POA: Insufficient documentation

## 2013-03-15 DIAGNOSIS — Z8679 Personal history of other diseases of the circulatory system: Secondary | ICD-10-CM | POA: Insufficient documentation

## 2013-03-15 DIAGNOSIS — J029 Acute pharyngitis, unspecified: Secondary | ICD-10-CM | POA: Insufficient documentation

## 2013-03-15 MED ORDER — PSEUDOEPHEDRINE HCL 30 MG PO TABS: 30.0000 mg | ORAL_TABLET | ORAL | Status: DC | PRN

## 2013-03-15 MED ORDER — IBUPROFEN 800 MG PO TABS: 800.0000 mg | ORAL_TABLET | Freq: Three times a day (TID) | ORAL | Status: DC

## 2013-03-15 NOTE — ED Notes (Signed)
PA at bedside Pt alert and oriented x4. Respirations even and unlabored, bilateral symmetrical rise and fall of chest. Skin warm and dry. In no acute distress. Denies needs.   

## 2013-03-15 NOTE — ED Provider Notes (Signed)
History     CSN: [id number]  Arrival date & time 03/15/13  1478   First MD Initiated Contact with Patient 03/15/13 [phone]      Chief Complaint  Patient presents with  . Sore Throat    (Consider location/radiation/quality/duration/timing/severity/associated sxs/prior treatment) HPI Anne Richardson is a 27 y.o. female who presents to ED with complaint of sore throat and nasal congestion for 2 days. States pain with swallowing, both liquids and solids. Nasal congestion is clear. No fever. No cough. No nausea, vomiting, abdominal pain, chest pain or SOB. Pt states she  Has taking tylenol and cough drops with no relief. No sick contacts. No other complaints.   Past Medical History  Diagnosis Date  . Pregnancy induced hypertension     Past Surgical History  Procedure Laterality Date  . Cesarean section    . Breast lumpectomy      left  . Biopsy breast    . Cesarean section  06/28/2012    Procedure: CESAREAN SECTION;  Surgeon: Lesly Dukes, MD;  Location: [facility];  Service: Gynecology;  Laterality: N/A;  . Wisdom tooth extraction      age 62    Family History  Problem Relation Age of Onset  . Cancer Mother 75    breast  . Alcohol abuse Mother   . Depression Mother   . Hypertension Mother   . Depression Sister   . Hypertension Sister   . Asthma Brother   . Depression Brother   . Hypertension Brother   . Alcohol abuse Maternal Aunt   . Hypertension Maternal Aunt   . Cancer Maternal Aunt 65    breast ca  . Alcohol abuse Maternal Uncle   . Hypertension Maternal Uncle   . Alzheimer's disease Maternal Grandmother   . Heart disease Maternal Grandmother   . Alcohol abuse Maternal Grandmother   . Hypertension Maternal Grandmother   . Heart disease Maternal Grandfather   . Alcohol abuse Maternal Grandfather   . Hypertension Maternal Grandfather   . Cancer Maternal Grandfather     prostate and bone  . Depression Paternal Grandfather     History  Substance Use Topics   . Smoking status: Never Smoker   . Smokeless tobacco: Not on file  . Alcohol Use: No    OB History   Grav Para Term Preterm Abortions TAB SAB Ect Mult Living   3 2 2  1  0 1   2      Review of Systems  Constitutional: Negative for fever and chills.  HENT: Positive for congestion, sore throat and trouble swallowing. Negative for ear pain, neck pain and neck stiffness.   Respiratory: Negative.   Cardiovascular: Negative.   Gastrointestinal: Negative.   Neurological: Positive for headaches.    Allergies  Review of patient's allergies indicates no known allergies.  Home Medications   Current Outpatient Rx  Name  Route  Sig  Dispense  Refill  . acetaminophen (TYLENOL) 500 MG tablet   Oral   Take 1,000 mg by mouth every 6 (six) hours as needed. For pain.         Marland Kitchen HYDROcodone-acetaminophen (NORCO/VICODIN) 5-325 MG per tablet   Oral   Take 2 tablets by mouth every 6 (six) hours as needed for pain.   10 tablet   0   . medroxyPROGESTERone (DEPO-PROVERA) 150 MG/ML injection   Intramuscular   Inject 150 mg into the muscle every 3 (three) months.  BP 140/84  Pulse 110  Temp(Src) 97.8 F (36.6 C) (Oral)  Resp 20  SpO2 100%  Physical Exam  Nursing note and vitals reviewed. Constitutional: She appears well-developed and well-nourished. No distress.  HENT:  Head: Normocephalic and atraumatic.  Right Ear: Tympanic membrane, external ear and ear canal normal.  Left Ear: Tympanic membrane, external ear and ear canal normal.  Nose: Rhinorrhea present.  Mouth/Throat: Uvula is midline and mucous membranes are normal. Posterior oropharyngeal erythema present. No oropharyngeal exudate, posterior oropharyngeal edema or tonsillar abscesses.  Eyes: Conjunctivae are normal.  Neck: Normal range of motion. Neck supple.  Cardiovascular: Normal rate, regular rhythm and normal heart sounds.   Pulmonary/Chest: Effort normal and breath sounds normal. No respiratory distress.  She has no wheezes. She has no rales.  Musculoskeletal: She exhibits no edema.  Lymphadenopathy:    She has no cervical adenopathy.  Neurological: She is alert.  Skin: Skin is warm and dry. No rash noted.    ED Course  Procedures (including critical care time)  Pt with sore throat, congestion. Will get strep screen. Pt is afebrile, no distress.   Results for orders placed during the hospital encounter of 03/15/13  RAPID STREP SCREEN      Result Value Range   Streptococcus, Group A Screen (Direct) NEGATIVE  NEGATIVE   No results found.    1. Viral pharyngitis   2. URI (upper respiratory infection)       MDM  PT with sore throat and nasal congestion. Strep negative. Tonsils symmetrical, uvula midline, no exudate. No strismus. No lymphadenopathy. PT is slightly tachycardic, stats she has not been drinking fluids due to pain. Pt is able to swallow and is drinking and swallowing her own secretions in ED. Suspect viral URI. Will treat symptomatically. Follow up with PCP.   Filed Vitals:   03/15/13 0741 03/15/13 0829  BP: 140/84 115/69  Pulse: 110 109  Temp: 97.8 F (36.6 C) 98 F (36.7 C)  TempSrc: Oral Oral  Resp: 20 20  SpO2: 100% 100%         Lottie Mussel, PA-C 03/15/13 [phone]

## 2013-03-15 NOTE — ED Notes (Signed)
Patient reports having a sore throat starting 3/24, pain 10/10. Patient reports taking ibuprofen last PM with some relief. Patient reports being unable to eat/drink anything,10/10 pain when swallowing. Patient denies any n/v/d

## 2013-03-15 NOTE — ED Notes (Signed)
PA at bedside.

## 2013-03-17 NOTE — ED Provider Notes (Signed)
Medical screening examination/treatment/procedure(s) were performed by non-physician practitioner and as supervising physician I was immediately available for consultation/collaboration.   Angla Delahunt M Ronasia Isola, DO 03/17/13 1842 

## 2014-10-22 ENCOUNTER — Encounter (HOSPITAL_COMMUNITY): Payer: Self-pay | Admitting: *Deleted

## 2015-03-04 ENCOUNTER — Inpatient Hospital Stay (HOSPITAL_COMMUNITY)
Admission: AD | Admit: 2015-03-04 | Discharge: 2015-03-04 | Disposition: A | Discharge status: Home / Self Care | Payer: Self-pay | Source: Ambulatory Visit | Attending: Obstetrics and Gynecology | Admitting: Obstetrics and Gynecology

## 2015-03-04 ENCOUNTER — Encounter (HOSPITAL_COMMUNITY): Payer: Self-pay | Admitting: *Deleted

## 2015-03-04 DIAGNOSIS — B9689 Other specified bacterial agents as the cause of diseases classified elsewhere: Secondary | ICD-10-CM | POA: Insufficient documentation

## 2015-03-04 DIAGNOSIS — Z3202 Encounter for pregnancy test, result negative: Secondary | ICD-10-CM | POA: Insufficient documentation

## 2015-03-04 DIAGNOSIS — N760 Acute vaginitis: Secondary | ICD-10-CM | POA: Insufficient documentation

## 2015-03-04 DIAGNOSIS — A499 Bacterial infection, unspecified: Secondary | ICD-10-CM

## 2015-03-04 LAB — URINALYSIS, ROUTINE W REFLEX MICROSCOPIC
Bilirubin Urine: NEGATIVE
GLUCOSE, UA: NEGATIVE mg/dL
Hgb urine dipstick: NEGATIVE
Ketones, ur: NEGATIVE mg/dL
LEUKOCYTES UA: NEGATIVE
Nitrite: NEGATIVE
PH: 6 (ref 5.0–8.0)
Protein, ur: NEGATIVE mg/dL
Urobilinogen, UA: 0.2 mg/dL (ref 0.0–1.0)

## 2015-03-04 LAB — WET PREP, GENITAL
Trich, Wet Prep: NONE SEEN
WBC WET PREP: NONE SEEN
Yeast Wet Prep HPF POC: NONE SEEN

## 2015-03-04 LAB — POCT PREGNANCY, URINE: Preg Test, Ur: NEGATIVE

## 2015-03-04 MED ORDER — METRONIDAZOLE 500 MG PO TABS: 500.0000 mg | ORAL_TABLET | Freq: Two times a day (BID) | ORAL | Status: AC

## 2015-03-04 MED ORDER — IBUPROFEN 600 MG PO TABS: 600.0000 mg | ORAL_TABLET | Freq: Four times a day (QID) | ORAL | Status: DC | PRN

## 2015-03-04 NOTE — MAU Note (Signed)
Pt states here for vaginal discharge that is clear/white with an odor x2-3 weeks. Notes mild itching after intercourse. Also having suprapubic shooting pain that comes and goes.

## 2015-03-04 NOTE — MAU Provider Note (Signed)
Chief Complaint  Patient presents with  . Vaginal Discharge  . Abdominal Pain    Subjective Anne BlazerChiquita J Norfleet 29 y.o.  [id number] presents for a pregnancy test and also concerned that she's had increased vaginal discharge with a malodor over the past week. Along with the discharge she's had intermittent suprapubic cramping is not having pain now. LMP 02/07/15. Denies dysuria, hematuria, frequency or urgency of urination.She has a recent new sexual partner. Does not want contraception.    Prescriptions prior to admission  Medication Sig Dispense Refill Last Dose  . acetaminophen (TYLENOL) 500 MG tablet Take 1,000 mg by mouth every 6 (six) hours as needed. For pain.   03/14/2013 at Unknown  . HYDROcodone-acetaminophen (NORCO/VICODIN) 5-325 MG per tablet Take 2 tablets by mouth every 6 (six) hours as needed for pain. 10 tablet 0 03/14/2013 at Unknown  . ibuprofen (ADVIL,MOTRIN) 800 MG tablet Take 1 tablet (800 mg total) by mouth 3 (three) times daily. 21 tablet 0   . pseudoephedrine (SUDAFED) 30 MG tablet Take 1 tablet (30 mg total) by mouth every 4 (four) hours as needed for congestion. 30 tablet 0     No Known Allergies   Objective   Filed Vitals:   03/04/15 1818  BP: 133/68  Pulse: 91  Temp: 98.3 F (36.8 C)  Resp: 18     Physical Exam General: Obese female in NAD  Abdomen: soft, NT Back: Neg CVAT External genitalia: normal; BUS neg  SSE: small amount grey homogenous discharge; cervix with no lesions, appears closed Bimanual: Cervix closed, long; uterus NT and unable to outline due to body habitus; adnexa nontender, no masses   Lab Results Results for orders placed or performed during the hospital encounter of 03/04/15 (from the past 24 hour(s))  Urinalysis, Routine w reflex microscopic     Status: Abnormal   Collection Time: 03/04/15  6:20 PM  Result Value Ref Range   Color, Urine YELLOW YELLOW   APPearance CLEAR CLEAR   Specific Gravity, Urine >1.030 (H) 1.005 - 1.030   pH 6.0 5.0 - 8.0   Glucose, UA NEGATIVE NEGATIVE mg/dL   Hgb urine dipstick NEGATIVE NEGATIVE   Bilirubin Urine NEGATIVE NEGATIVE   Ketones, ur NEGATIVE NEGATIVE mg/dL   Protein, ur NEGATIVE NEGATIVE mg/dL   Urobilinogen, UA 0.2 0.0 - 1.0 mg/dL   Nitrite NEGATIVE NEGATIVE   Leukocytes, UA NEGATIVE NEGATIVE  Pregnancy, urine POC     Status: None   Collection Time: 03/04/15  6:40 PM  Result Value Ref Range   Preg Test, Ur NEGATIVE NEGATIVE  Wet prep, genital     Status: Abnormal   Collection Time: 03/04/15  6:41 PM  Result Value Ref Range   Yeast Wet Prep HPF POC NONE SEEN NONE SEEN   Trich, Wet Prep NONE SEEN NONE SEEN   Clue Cells Wet Prep HPF POC FEW (A) NONE SEEN   WBC, Wet Prep HPF POC NONE SEEN NONE SEEN    Ultrasound  No results found.  Assessment 1. BV (bacterial vaginosis)      Plan    GC/CT sent Discharge home See AVS for pt education   Medication List    STOP taking these medications        HYDROcodone-acetaminophen 5-325 MG per tablet  Commonly known as:  NORCO/VICODIN     pseudoephedrine 30 MG tablet  Commonly known as:  SUDAFED      TAKE these medications        acetaminophen 500 MG tablet  Commonly known as:  TYLENOL  Take 1,000 mg by mouth every 6 (six) hours as needed. For pain.     ibuprofen 600 MG tablet  Commonly known as:  ADVIL,MOTRIN  Take 1 tablet (600 mg total) by mouth every 6 (six) hours as needed.     metroNIDAZOLE 500 MG tablet  Commonly known as:  FLAGYL  Take 1 tablet (500 mg total) by mouth 2 (two) times daily.        Follow-up Information    Follow up with [facility] [place].   Why:  If symptoms worsen   Contact information:   [address] [place] [address] [phone]      Trevious Rampey 03/04/2015 6:41 PM

## 2015-03-04 NOTE — Discharge Instructions (Signed)
Safe Sex Safe sex is about reducing the risk of giving or getting a sexually transmitted disease (STD). STDs are spread through sexual contact involving the genitals, mouth, or rectum. Some STDs can be cured and others cannot. Safe sex can also prevent unintended pregnancies.  WHAT ARE SOME SAFE SEX PRACTICES?  Limit your sexual activity to only one partner who is having sex with only you.  Talk to your partner about his or her past partners, past STDs, and drug use.  Use a condom every time you have sexual intercourse. This includes vaginal, oral, and anal sexual activity. Both females and males should wear condoms during oral sex. Only use latex or polyurethane condoms and water-based lubricants. Using petroleum-based lubricants or oils to lubricate a condom will weaken the condom and increase the chance that it will break. The condom should be in place from the beginning to the end of sexual activity. Wearing a condom reduces, but does not completely eliminate, your risk of getting or giving an STD. STDs can be spread by contact with infected body fluids and skin.  Get vaccinated for hepatitis B and HPV.  Avoid alcohol and recreational drugs, which can affect your judgment. You may forget to use a condom or participate in high-risk sex.  For females, avoid douching after sexual intercourse. Douching can spread an infection farther into the reproductive tract.  Check your body for signs of sores, blisters, rashes, or unusual discharge. See your health care provider if you notice any of these signs.  Avoid sexual contact if you have symptoms of an infection or are being treated for an STD. If you or your partner has herpes, avoid sexual contact when blisters are present. Use condoms at all other times.  If you are at risk of being infected with HIV, it is recommended that you take a prescription medicine daily to prevent HIV infection. This is called pre-exposure prophylaxis (PrEP). You are  considered at risk if:  You are a man who has sex with other men (MSM).  You are a heterosexual man or woman who is sexually active with more than one partner.  You take drugs by injection.  You are sexually active with a partner who has HIV.  Talk with your health care provider about whether you are at high risk of being infected with HIV. If you choose to begin PrEP, you should first be tested for HIV. You should then be tested every 3 months for as long as you are taking PrEP.  See your health care provider for regular screenings, exams, and tests for other STDs. Before having sex with a new partner, each of you should be screened for STDs and should talk about the results with each other. WHAT ARE THE BENEFITS OF SAFE SEX?   There is less chance of getting or giving an STD.  You can prevent unwanted or unintended pregnancies.  By discussing safe sex concerns with your partner, you may increase feelings of intimacy, comfort, trust, and honesty between the two of you. Document Released: 01/14/2005 Document Revised: 04/23/2014 Document Reviewed: 05/30/2012 [facility] Patient Information 2015 [place], [place]. This information is not intended to replace advice given  Bacterial Vaginosis Bacterial vaginosis is a vaginal infection that occurs when the normal balance of bacteria in the vagina is disrupted. It results from an overgrowth of certain bacteria. This is the most common vaginal infection in women of childbearing age. Treatment is important to prevent complications, especially in pregnant women, as it can cause  a premature delivery. CAUSES  Bacterial vaginosis is caused by an increase in harmful bacteria that are normally present in smaller amounts in the vagina. Several different kinds of bacteria can cause bacterial vaginosis. However, the reason that the condition develops is not fully understood. RISK FACTORS Certain activities or behaviors can put you at an increased risk of  developing bacterial vaginosis, including:  Having a new sex partner or multiple sex partners.  Douching.  Using an intrauterine device (IUD) for contraception. Women do not get bacterial vaginosis from toilet seats, bedding, swimming pools, or contact with objects around them. SIGNS AND SYMPTOMS  Some women with bacterial vaginosis have no signs or symptoms. Common symptoms include:  Grey vaginal discharge.  A fishlike odor with discharge, especially after sexual intercourse.  Itching or burning of the vagina and vulva.  Burning or pain with urination. DIAGNOSIS  Your health care provider will take a medical history and examine the vagina for signs of bacterial vaginosis. A sample of vaginal fluid may be taken. Your health care provider will look at this sample under a microscope to check for bacteria and abnormal cells. A vaginal pH test may also be done.  TREATMENT  Bacterial vaginosis may be treated with antibiotic medicines. These may be given in the form of a pill or a vaginal cream. A second round of antibiotics may be prescribed if the condition comes back after treatment.  HOME CARE INSTRUCTIONS   Only take over-the-counter or prescription medicines as directed by your health care provider.  If antibiotic medicine was prescribed, take it as directed. Make sure you finish it even if you start to feel better.  Do not have sex until treatment is completed.  Tell all sexual partners that you have a vaginal infection. They should see their health care provider and be treated if they have problems, such as a mild rash or itching.  Practice safe sex by using condoms and only having one sex partner. SEEK MEDICAL CARE IF:   Your symptoms are not improving after 3 days of treatment.  You have increased discharge or pain.  You have a fever. MAKE SURE YOU:   Understand these instructions.  Will watch your condition.  Will get help right away if you are not doing well or get  worse. FOR MORE INFORMATION  Centers for Disease Control and Prevention, Division of STD Prevention: SolutionApps.co.zawww.cdc.gov/std American Sexual Health Association (ASHA): www.ashastd.org  Document Released: 12/07/2005 Document Revised: 09/27/2013 Document Reviewed: 07/19/2013 [facility] Patient Information 2015 [place]. This information is not intended to replace advice given to you by your health care provider. Make sure you discuss any questions you have with your health care provider. to you by your health care provider. Make sure you discuss any questions you have with your health care provider.

## 2015-03-05 LAB — HIV ANTIBODY (ROUTINE TESTING W REFLEX): HIV SCREEN 4TH GENERATION: NONREACTIVE

## 2015-03-05 LAB — GC/CHLAMYDIA PROBE AMP (~~LOC~~) NOT AT ARMC
Chlamydia: NEGATIVE
Neisseria Gonorrhea: NEGATIVE

## 2015-05-15 ENCOUNTER — Inpatient Hospital Stay (HOSPITAL_COMMUNITY)
Admission: AD | Admit: 2015-05-15 | Discharge: 2015-05-16 | Disposition: A | Discharge status: Home / Self Care | Payer: Self-pay | Source: Ambulatory Visit | Attending: Obstetrics & Gynecology | Admitting: Obstetrics & Gynecology

## 2015-05-15 DIAGNOSIS — O26891 Other specified pregnancy related conditions, first trimester: Secondary | ICD-10-CM

## 2015-05-15 DIAGNOSIS — Z880 Allergy status to penicillin: Secondary | ICD-10-CM | POA: Insufficient documentation

## 2015-05-15 DIAGNOSIS — Z8249 Family history of ischemic heart disease and other diseases of the circulatory system: Secondary | ICD-10-CM | POA: Insufficient documentation

## 2015-05-15 DIAGNOSIS — O9989 Other specified diseases and conditions complicating pregnancy, childbirth and the puerperium: Secondary | ICD-10-CM | POA: Insufficient documentation

## 2015-05-15 DIAGNOSIS — Z3A01 Less than 8 weeks gestation of pregnancy: Secondary | ICD-10-CM | POA: Insufficient documentation

## 2015-05-15 DIAGNOSIS — R102 Pelvic and perineal pain: Secondary | ICD-10-CM | POA: Insufficient documentation

## 2015-05-15 LAB — URINALYSIS, ROUTINE W REFLEX MICROSCOPIC
Bilirubin Urine: NEGATIVE
Glucose, UA: NEGATIVE mg/dL
Hgb urine dipstick: NEGATIVE
Ketones, ur: NEGATIVE mg/dL
Leukocytes, UA: NEGATIVE
Nitrite: NEGATIVE
Protein, ur: NEGATIVE mg/dL
Specific Gravity, Urine: 1.025 (ref 1.005–1.030)
Urobilinogen, UA: 0.2 mg/dL (ref 0.0–1.0)
pH: 6 (ref 5.0–8.0)

## 2015-05-15 LAB — POCT PREGNANCY, URINE: Preg Test, Ur: POSITIVE — AB

## 2015-05-15 LAB — CBC
HCT: 36.4 % (ref 36.0–46.0)
Hemoglobin: 12.7 g/dL (ref 12.0–15.0)
MCH: 30.4 pg (ref 26.0–34.0)
MCHC: 34.9 g/dL (ref 30.0–36.0)
MCV: 87.1 fL (ref 78.0–100.0)
PLATELETS: 264 10*3/uL (ref 150–400)
RBC: 4.18 MIL/uL (ref 3.87–5.11)
RDW: 13.5 % (ref 11.5–15.5)
WBC: 10.8 10*3/uL — ABNORMAL HIGH (ref 4.0–10.5)

## 2015-05-15 NOTE — MAU Note (Signed)
Pt states she has been hurting in her lower abd since yesterday. Some dysuria.

## 2015-05-16 ENCOUNTER — Encounter (HOSPITAL_COMMUNITY): Payer: Self-pay

## 2015-05-16 ENCOUNTER — Inpatient Hospital Stay (HOSPITAL_COMMUNITY): Payer: Self-pay

## 2015-05-16 DIAGNOSIS — R102 Pelvic and perineal pain: Secondary | ICD-10-CM

## 2015-05-16 DIAGNOSIS — O26891 Other specified pregnancy related conditions, first trimester: Secondary | ICD-10-CM

## 2015-05-16 LAB — GC/CHLAMYDIA PROBE AMP (~~LOC~~) NOT AT ARMC
Chlamydia: NEGATIVE
Neisseria Gonorrhea: NEGATIVE

## 2015-05-16 LAB — WET PREP, GENITAL
Trich, Wet Prep: NONE SEEN
YEAST WET PREP: NONE SEEN

## 2015-05-16 LAB — HCG, QUANTITATIVE, PREGNANCY: HCG, BETA CHAIN, QUANT, S: 19589 m[IU]/mL — AB (ref <5)

## 2015-05-16 NOTE — Discharge Instructions (Signed)
First Trimester of Pregnancy The first trimester of pregnancy is from week 1 until the end of week 12 (months 1 through 3). A week after a sperm fertilizes an egg, the egg will implant on the wall of the uterus. This embryo will begin to develop into a baby. Genes from you and your partner are forming the baby. The female genes determine whether the baby is a boy or a girl. At 6-8 weeks, the eyes and face are formed, and the heartbeat can be seen on ultrasound. At the end of 12 weeks, all the baby's organs are formed.  Now that you are pregnant, you will want to do everything you can to have a healthy baby. Two of the most important things are to get good prenatal care and to follow your health care provider's instructions. Prenatal care is all the medical care you receive before the baby's birth. This care will help prevent, find, and treat any problems during the pregnancy and childbirth. BODY CHANGES Your body goes through many changes during pregnancy. The changes vary from woman to woman.   You may gain or lose a couple of pounds at first.  You may feel sick to your stomach (nauseous) and throw up (vomit). If the vomiting is uncontrollable, call your health care provider.  You may tire easily.  You may develop headaches that can be relieved by medicines approved by your health care provider.  You may urinate more often. Painful urination may mean you have a bladder infection.  You may develop heartburn as a result of your pregnancy.  You may develop constipation because certain hormones are causing the muscles that push waste through your intestines to slow down.  You may develop hemorrhoids or swollen, bulging veins (varicose veins).  Your breasts may begin to grow larger and become tender. Your nipples may stick out more, and the tissue that surrounds them (areola) may become darker.  Your gums may bleed and may be sensitive to brushing and flossing.  Dark spots or blotches (chloasma,  mask of pregnancy) may develop on your face. This will likely fade after the baby is born.  Your menstrual periods will stop.  You may have a loss of appetite.  You may develop cravings for certain kinds of food.  You may have changes in your emotions from day to day, such as being excited to be pregnant or being concerned that something may go wrong with the pregnancy and baby.  You may have more vivid and strange dreams.  You may have changes in your hair. These can include thickening of your hair, rapid growth, and changes in texture. Some women also have hair loss during or after pregnancy, or hair that feels dry or thin. Your hair will most likely return to normal after your baby is born. WHAT TO EXPECT AT YOUR PRENATAL VISITS During a routine prenatal visit:  You will be weighed to make sure you and the baby are growing normally.  Your blood pressure will be taken.  Your abdomen will be measured to track your baby's growth.  The fetal heartbeat will be listened to starting around week 10 or 12 of your pregnancy.  Test results from any previous visits will be discussed. Your health care provider may ask you:  How you are feeling.  If you are feeling the baby move.  If you have had any abnormal symptoms, such as leaking fluid, bleeding, severe headaches, or abdominal cramping.  If you have any questions. Other tests   that may be performed during your first trimester include:  Blood tests to find your blood type and to check for the presence of any previous infections. They will also be used to check for low iron levels (anemia) and Rh antibodies. Later in the pregnancy, blood tests for diabetes will be done along with other tests if problems develop.  Urine tests to check for infections, diabetes, or protein in the urine.  An ultrasound to confirm the proper growth and development of the baby.  An amniocentesis to check for possible genetic problems.  Fetal screens for  spina bifida and Down syndrome.  You may need other tests to make sure you and the baby are doing well. HOME CARE INSTRUCTIONS  Medicines  Follow your health care provider's instructions regarding medicine use. Specific medicines may be either safe or unsafe to take during pregnancy.  Take your prenatal vitamins as directed.  If you develop constipation, try taking a stool softener if your health care provider approves. Diet  Eat regular, well-balanced meals. Choose a variety of foods, such as meat or vegetable-based protein, fish, milk and low-fat dairy products, vegetables, fruits, and whole grain breads and cereals. Your health care provider will help you determine the amount of weight gain that is right for you.  Avoid raw meat and uncooked cheese. These carry germs that can cause birth defects in the baby.  Eating four or five small meals rather than three large meals a day may help relieve nausea and vomiting. If you start to feel nauseous, eating a few soda crackers can be helpful. Drinking liquids between meals instead of during meals also seems to help nausea and vomiting.  If you develop constipation, eat more high-fiber foods, such as fresh vegetables or fruit and whole grains. Drink enough fluids to keep your urine clear or pale yellow. Activity and Exercise  Exercise only as directed by your health care provider. Exercising will help you:  Control your weight.  Stay in shape.  Be prepared for labor and delivery.  Experiencing pain or cramping in the lower abdomen or low back is a good sign that you should stop exercising. Check with your health care provider before continuing normal exercises.  Try to avoid standing for long periods of time. Move your legs often if you must stand in one place for a long time.  Avoid heavy lifting.  Wear low-heeled shoes, and practice good posture.  You may continue to have sex unless your health care provider directs you  otherwise. Relief of Pain or Discomfort  Wear a good support bra for breast tenderness.   Take warm sitz baths to soothe any pain or discomfort caused by hemorrhoids. Use hemorrhoid cream if your health care provider approves.   Rest with your legs elevated if you have leg cramps or low back pain.  If you develop varicose veins in your legs, wear support hose. Elevate your feet for 15 minutes, 3-4 times a day. Limit salt in your diet. Prenatal Care  Schedule your prenatal visits by the twelfth week of pregnancy. They are usually scheduled monthly at first, then more often in the last 2 months before delivery.  Write down your questions. Take them to your prenatal visits.  Keep all your prenatal visits as directed by your health care provider. Safety  Wear your seat belt at all times when driving.  Make a list of emergency phone numbers, including numbers for family, friends, the hospital, and police and fire departments. General Tips    Ask your health care provider for a referral to a local prenatal education class. Begin classes no later than at the beginning of month 6 of your pregnancy.  Ask for help if you have counseling or nutritional needs during pregnancy. Your health care provider can offer advice or refer you to specialists for help with various needs.  Do not use hot tubs, steam rooms, or saunas.  Do not douche or use tampons or scented sanitary pads.  Do not cross your legs for long periods of time.  Avoid cat litter boxes and soil used by cats. These carry germs that can cause birth defects in the baby and possibly loss of the fetus by miscarriage or stillbirth.  Avoid all smoking, herbs, alcohol, and medicines not prescribed by your health care provider. Chemicals in these affect the formation and growth of the baby.  Schedule a dentist appointment. At home, brush your teeth with a soft toothbrush and be gentle when you floss. SEEK MEDICAL CARE IF:   You have  dizziness.  You have mild pelvic cramps, pelvic pressure, or nagging pain in the abdominal area.  You have persistent nausea, vomiting, or diarrhea.  You have a bad smelling vaginal discharge.  You have pain with urination.  You notice increased swelling in your face, hands, legs, or ankles. SEEK IMMEDIATE MEDICAL CARE IF:   You have a fever.  You are leaking fluid from your vagina.  You have spotting or bleeding from your vagina.  You have severe abdominal cramping or pain.  You have rapid weight gain or loss.  You vomit blood or material that looks like coffee grounds.  You are exposed to German measles and have never had them.  You are exposed to fifth disease or chickenpox.  You develop a severe headache.  You have shortness of breath.  You have any kind of trauma, such as from a fall or a car accident. Document Released: 12/01/2001 Document Revised: 04/23/2014 Document Reviewed: 10/17/2013 Exit[employer] Patient Information 2015 [employer]. This information is not intended to replace advice given to you by your health care provider. Make sure you discuss any questions you have with your health care provider.  

## 2015-05-16 NOTE — MAU Provider Note (Signed)
History     CSN: [id number]  Arrival date and time: 05/15/15 2253   None     No chief complaint on file.  HPI Comments: Anne Richardson is  A 29 y.o. [id number] with LMP 04/08/15. She did not know she was pregnant. She started having lower abdominal pain yesterday. She denies any bleeding.   Abdominal Pain This is a new problem. The current episode started yesterday. The onset quality is gradual. The problem occurs constantly. The problem has been unchanged. The pain is located in the suprapubic region. The pain is at a severity of 8/10. The quality of the pain is cramping. The abdominal pain does not radiate. Associated symptoms include dysuria and nausea. Pertinent negatives include no constipation, diarrhea, fever, frequency or vomiting. Nothing aggravates the pain. The pain is relieved by nothing. She has tried acetaminophen for the symptoms. The treatment provided no relief.     Past Medical History  Diagnosis Date  . Pregnancy induced hypertension     Past Surgical History  Procedure Laterality Date  . Cesarean section    . Breast lumpectomy      left  . Biopsy breast    . Cesarean section  06/28/2012    Procedure: CESAREAN SECTION;  Surgeon: Lesly Dukes, MD;  Location: [facility];  Service: Gynecology;  Laterality: N/A;  . Wisdom tooth extraction      age 28    Family History  Problem Relation Age of Onset  . Cancer Mother 81    breast  . Alcohol abuse Mother   . Depression Mother   . Hypertension Mother   . Depression Sister   . Hypertension Sister   . Asthma Brother   . Depression Brother   . Hypertension Brother   . Alcohol abuse Maternal Aunt   . Hypertension Maternal Aunt   . Cancer Maternal Aunt 65    breast ca  . Alcohol abuse Maternal Uncle   . Hypertension Maternal Uncle   . Alzheimer's disease Maternal Grandmother   . Heart disease Maternal Grandmother   . Alcohol abuse Maternal Grandmother   . Hypertension Maternal Grandmother   . Heart  disease Maternal Grandfather   . Alcohol abuse Maternal Grandfather   . Hypertension Maternal Grandfather   . Cancer Maternal Grandfather     prostate and bone  . Depression Paternal Grandfather     History  Substance Use Topics  . Smoking status: Never Smoker   . Smokeless tobacco: Never Used  . Alcohol Use: No    Allergies:  Allergies  Allergen Reactions  . Penicillins Itching and Swelling    Prescriptions prior to admission  Medication Sig Dispense Refill Last Dose  . PRESCRIPTION MEDICATION Pt reports she took her aunt's percocet on 05/13/2015- advised not to take anymore.   Past Week at Unknown time  . acetaminophen (TYLENOL) 500 MG tablet Take 1,000 mg by mouth every 6 (six) hours as needed. For pain.   Past Month at Unknown time  . ibuprofen (ADVIL,MOTRIN) 600 MG tablet Take 1 tablet (600 mg total) by mouth every 6 (six) hours as needed. 30 tablet 1     Review of Systems  Constitutional: Negative for fever.  Gastrointestinal: Positive for nausea and abdominal pain. Negative for vomiting, diarrhea and constipation.  Genitourinary: Positive for dysuria. Negative for urgency and frequency.   Physical Exam   Blood pressure 115/62, pulse 79, temperature 98.7 F (37.1 C), temperature source Oral, resp. rate 16, height  (1.651 m),  weight 109.317 kg (241 lb), last menstrual period 04/08/2015, SpO2 100 %.  Physical Exam  Nursing note and vitals reviewed. Constitutional: She is oriented to person, place, and time. She appears well-developed and well-nourished. No distress.  Cardiovascular: Normal rate.   Respiratory: Effort normal.  GI: Soft. There is no tenderness. There is no rebound.  Neurological: She is alert and oriented to person, place, and time.  Skin: Skin is warm and dry.  Psychiatric: She has a normal mood and affect.    Results for orders placed or performed during the hospital encounter of 05/15/15 (from the past 24 hour(s))  Urinalysis, Routine w  reflex microscopic (not at [facility])     Status: None   Collection Time: 05/15/15 11:15 PM  Result Value Ref Range   Color, Urine YELLOW YELLOW   APPearance CLEAR CLEAR   Specific Gravity, Urine 1.025 1.005 - 1.030   pH 6.0 5.0 - 8.0   Glucose, UA NEGATIVE NEGATIVE mg/dL   Hgb urine dipstick NEGATIVE NEGATIVE   Bilirubin Urine NEGATIVE NEGATIVE   Ketones, ur NEGATIVE NEGATIVE mg/dL   Protein, ur NEGATIVE NEGATIVE mg/dL   Urobilinogen, UA 0.2 0.0 - 1.0 mg/dL   Nitrite NEGATIVE NEGATIVE   Leukocytes, UA NEGATIVE NEGATIVE  Pregnancy, urine POC     Status: Abnormal   Collection Time: 05/15/15 11:21 PM  Result Value Ref Range   Preg Test, Ur POSITIVE (A) NEGATIVE  CBC     Status: Abnormal   Collection Time: 05/15/15 11:29 PM  Result Value Ref Range   WBC 10.8 (H) 4.0 - 10.5 K/uL   RBC 4.18 3.87 - 5.11 MIL/uL   Hemoglobin 12.7 12.0 - 15.0 g/dL   HCT 16.1 09.6 - 04.5 %   MCV 87.1 78.0 - 100.0 fL   MCH 30.4 26.0 - 34.0 pg   MCHC 34.9 30.0 - 36.0 g/dL   RDW 40.9 81.1 - 91.4 %   Platelets 264 150 - 400 K/uL  hCG, quantitative, pregnancy     Status: Abnormal   Collection Time: 05/15/15 11:29 PM  Result Value Ref Range   hCG, Beta Chain, Quant, S 19589 (H) <5 mIU/mL  Wet prep, genital     Status: Abnormal   Collection Time: 05/16/15 12:05 AM  Result Value Ref Range   Yeast Wet Prep HPF POC NONE SEEN NONE SEEN   Trich, Wet Prep NONE SEEN NONE SEEN   Clue Cells Wet Prep HPF POC FEW (A) NONE SEEN   WBC, Wet Prep HPF POC FEW (A) NONE SEEN   US Ob Comp Less 14 Wks  05/16/2015   CLINICAL DATA:  Worsening pelvic pain for the past 2 days. Beta HCG -19,589  EXAM: OBSTETRIC <14 WK [place] AND TRANSVAGINAL OB US  TECHNIQUE: Both transabdominal and transvaginal ultrasound examinations were performed for complete evaluation of the gestation as well as the maternal uterus, adnexal regions, and pelvic cul-de-sac. Transvaginal technique was performed to assess early pregnancy.  COMPARISON:  None.  FINDINGS:  Intrauterine gestational sac: Single. While the intrauterine gestational sac contains well-defined borders, there is a slightly abnormal appearing surrounding decidual reaction seen best on the provided cinegraphic images.  Yolk sac:  Visualized though appear small (images 90 and 96).  Embryo:  Not visualized  MSD: 15.9  mm   6 w   3  d  Maternal uterus/adnexae:  Otherwise, normal appearance of the anteverted uterus. No discrete uterine mass. No free fluid in the pelvic cul-de-sac.  The left ovary is normal  in size, measuring approximately 4.7 x 3.3 x 4.8 cm. Note is made of an approximately 2.3 x 2.5 cm presumed corpus luteal cyst within the left ovary (representative image 68).  The right ovary is normal in size and appearance measuring approximately 5.0 x 4.4 x 2.4 cm. Several tiny follicles are noted within the peripheral aspect the right ovary. No discrete right-sided ovarian or adnexal lesion  IMPRESSION: 1. Grossly normal-appearing intrauterine gestational sac with apparent abnormal surrounding decidual reaction and lack of visualization of an embryo. Mean sac diameter currently measures 15.9 mm. Findings are suspicious but not yet definitive for failed pregnancy. Recommend follow-up US in 10-14 days for definitive diagnosis. This recommendation follows SRU consensus guidelines: Diagnostic Criteria for Nonviable Pregnancy Early in the First Trimester. Malva Limes Engl J Med 2013; 161:0960-45; 369:1443-51. 2. Note is made of an approximately 2.5 cm corpus luteal cyst within the left ovary.   Electronically Signed   By: Simonne ComeJohn  Watts M.D.   On: 05/16/2015 01:25   [place] Ob Transvaginal  05/16/2015   CLINICAL DATA:  Worsening pelvic pain for the past 2 days. Beta HCG -19,589  EXAM: OBSTETRIC <14 WK US AND TRANSVAGINAL OB US  TECHNIQUE: Both transabdominal and transvaginal ultrasound examinations were performed for complete evaluation of the gestation as well as the maternal uterus, adnexal regions, and pelvic cul-de-sac. Transvaginal  technique was performed to assess early pregnancy.  COMPARISON:  None.  FINDINGS: Intrauterine gestational sac: Single. While the intrauterine gestational sac contains well-defined borders, there is a slightly abnormal appearing surrounding decidual reaction seen best on the provided cinegraphic images.  Yolk sac:  Visualized though appear small (images 90 and 96).  Embryo:  Not visualized  MSD: 15.9  mm   6 w   3  d  Maternal uterus/adnexae:  Otherwise, normal appearance of the anteverted uterus. No discrete uterine mass. No free fluid in the pelvic cul-de-sac.  The left ovary is normal in size, measuring approximately 4.7 x 3.3 x 4.8 cm. Note is made of an approximately 2.3 x 2.5 cm presumed corpus luteal cyst within the left ovary (representative image 68).  The right ovary is normal in size and appearance measuring approximately 5.0 x 4.4 x 2.4 cm. Several tiny follicles are noted within the peripheral aspect the right ovary. No discrete right-sided ovarian or adnexal lesion  IMPRESSION: 1. Grossly normal-appearing intrauterine gestational sac with apparent abnormal surrounding decidual reaction and lack of visualization of an embryo. Mean sac diameter currently measures 15.9 mm. Findings are suspicious but not yet definitive for failed pregnancy. Recommend follow-up US in 10-14 days for definitive diagnosis. This recommendation follows SRU consensus guidelines: Diagnostic Criteria for Nonviable Pregnancy Early in the First Trimester. Malva Limes Engl J Med 2013; 409:8119-14; 369:1443-51. 2. Note is made of an approximately 2.5 cm corpus luteal cyst within the left ovary.   Electronically Signed   By: Simonne ComeJohn  Watts M.D.   On: 05/16/2015 01:25    MAU Course  Procedures  MDM  Assessment and Plan   1. Pelvic pain affecting pregnancy in first trimester, antepartum    DC home SAB precautions  Return to MAU as needed UC pending  Follow-up Information    [facility]   Specialty:  Obstetrics and  Gynecology   Why:  05/23/15 at 1:00 pm for repeat bloodwork.    Contact information:   [address] [place] [id number] [phone]       Tawnya CrookHogan, Jaslynne Dahan Donovan 05/16/2015, 1:27 AM

## 2015-05-17 LAB — HIV ANTIBODY (ROUTINE TESTING W REFLEX): HIV Screen 4th Generation wRfx: NONREACTIVE

## 2015-05-18 LAB — URINE CULTURE

## 2015-05-23 ENCOUNTER — Other Ambulatory Visit: Payer: Self-pay

## 2015-05-29 ENCOUNTER — Other Ambulatory Visit: Payer: Self-pay

## 2015-05-29 DIAGNOSIS — O200 Threatened abortion: Secondary | ICD-10-CM

## 2015-05-29 NOTE — Progress Notes (Unsigned)
Patient here today for bhcg, missed last appt. Patient reports continued pain off and on right above pubic bone that is sometimes unbearable. Reports no further bleeding. Discussed with Dr Erin FullingHarraway Smith who advised no bhcg today but needs ultrasound. Scheduled ultrasound 6/9 @ 4pm. Informed patient of appt and to come to MAU if anything changes between now and then like bleeding or constant severe pain. Patient verbalized understanding to all and had no questions

## 2015-05-30 ENCOUNTER — Ambulatory Visit (HOSPITAL_COMMUNITY): Payer: Self-pay

## 2015-05-31 ENCOUNTER — Ambulatory Visit (HOSPITAL_COMMUNITY)
Admission: RE | Admit: 2015-05-31 | Discharge: 2015-05-31 | Disposition: A | Discharge status: Home / Self Care | Payer: Self-pay | Source: Ambulatory Visit | Attending: Obstetrics & Gynecology | Admitting: Obstetrics & Gynecology

## 2015-05-31 ENCOUNTER — Inpatient Hospital Stay (HOSPITAL_COMMUNITY)
Admission: AD | Admit: 2015-05-31 | Discharge: 2015-05-31 | Disposition: A | Discharge status: Home / Self Care | Payer: Self-pay | Source: Ambulatory Visit | Attending: Obstetrics & Gynecology | Admitting: Obstetrics & Gynecology

## 2015-05-31 DIAGNOSIS — O200 Threatened abortion: Secondary | ICD-10-CM | POA: Insufficient documentation

## 2015-05-31 DIAGNOSIS — O021 Missed abortion: Secondary | ICD-10-CM

## 2015-05-31 NOTE — Discharge Instructions (Signed)
Incomplete Miscarriage A miscarriage is the sudden loss of an unborn baby (fetus) before the 20th week of pregnancy. In an incomplete miscarriage, parts of the fetus or placenta (afterbirth) remain in the body.  Having a miscarriage can be an emotional experience. Talk with your health care provider about any questions you may have about miscarrying, the grieving process, and your future pregnancy plans. CAUSES   Problems with the fetal chromosomes that make it impossible for the baby to develop normally. Problems with the baby's genes or chromosomes are most often the result of errors that occur by chance as the embryo divides and grows. The problems are not inherited from the parents.  Infection of the cervix or uterus.  Hormone problems.  Problems with the cervix, such as having an incompetent cervix. This is when the tissue in the cervix is not strong enough to hold the pregnancy.  Problems with the uterus, such as an abnormally shaped uterus, uterine fibroids, or congenital abnormalities.  Certain medical conditions.  Smoking, drinking alcohol, or taking illegal drugs.  Trauma. SYMPTOMS   Vaginal bleeding or spotting, with or without cramps or pain.  Pain or cramping in the abdomen or lower back.  Passing fluid, tissue, or blood clots from the vagina. DIAGNOSIS  Your health care provider will perform a physical exam. You may also have an ultrasound to confirm the miscarriage. Blood or urine tests may also be ordered. TREATMENT   Usually, a dilation and curettage (D&C) procedure is performed. During a D&C procedure, the cervix is widened (dilated) and any remaining fetal or placental tissue is gently removed from the uterus.  Antibiotic medicines are prescribed if there is an infection. Other medicines may be given to reduce the size of the uterus (contract) if there is a lot of bleeding.  If you have Rh negative blood and your baby was Rh positive, you will need a Rho (D)  immune globulin shot. This shot will protect any future baby from having Rh blood problems in future pregnancies.  You may be confined to bed rest. This means you should stay in bed and only get up to use the bathroom. HOME CARE INSTRUCTIONS   Rest as directed by your health care provider.  Restrict activity as directed by your health care provider. You may be allowed to continue light activity if curettage was not done but you require further treatment.  Keep track of the number of pads you use each day. Keep track of how soaked (saturated) they are. Record this information.  Do not  use tampons.  Do not douche or have sexual intercourse until approved by your health care provider.  Keep all follow-up appointments for reevaluation and continuing management.  Only take over-the-counter or prescription medicines for pain, fever, or discomfort as directed by your health care provider.  Take antibiotic medicine as directed by your health care provider. Make sure you finish it even if you start to feel better. SEEK IMMEDIATE MEDICAL CARE IF:   You experience severe cramps in your stomach, back, or abdomen.  You have an unexplained temperature (make sure to record these temperatures).  You pass large clots or tissue (save these for your health care provider to inspect).  Your bleeding increases.  You become light-headed, weak, or have fainting episodes. MAKE SURE YOU:   Understand these instructions.  Will watch your condition.  Will get help right away if you are not doing well or get worse. Document Released: 12/07/2005 Document Revised: 04/23/2014 Document Reviewed:   07/06/2013 ExitCare Patient Information 2015 [employer]. This information is not intended to replace advice given to you by your health care provider. Make sure you discuss any questions you have with your health care provider.  

## 2015-05-31 NOTE — MAU Provider Note (Signed)
Pt presents to MAU for results of U/S.  She notes scant vaginal bleeding and some cramping consistent with menstrual cramps that just started last evening.   U/S results are consistent with failed pregnancy.    Pt is understanding of this and accepting of the results.  She is opting for expectant management at this time.  She has no OB/GYN.     Appt scheduled for [facility]OC on Thurs, 6/23 at 1pm.    Patient may return to MAU as needed or if her condition were to change or worsen

## 2015-06-13 ENCOUNTER — Encounter: Payer: Self-pay | Admitting: Family

## 2015-06-13 ENCOUNTER — Ambulatory Visit (INDEPENDENT_AMBULATORY_CARE_PROVIDER_SITE_OTHER): Payer: Self-pay | Admitting: Family

## 2015-06-13 VITALS — BP 116/74 | HR 68 | Ht 65.5 in | Wt 235.8 lb

## 2015-06-13 DIAGNOSIS — O039 Complete or unspecified spontaneous abortion without complication: Secondary | ICD-10-CM

## 2015-06-13 LAB — CBC
HCT: 37.7 % (ref 36.0–46.0)
HEMOGLOBIN: 12.5 g/dL (ref 12.0–15.0)
MCH: 29.8 pg (ref 26.0–34.0)
MCHC: 33.2 g/dL (ref 30.0–36.0)
MCV: 89.8 fL (ref 78.0–100.0)
MPV: 9.6 fL (ref 8.6–12.4)
Platelets: 327 10*3/uL (ref 150–400)
RBC: 4.2 MIL/uL (ref 3.87–5.11)
RDW: 14 % (ref 11.5–15.5)
WBC: 6.3 10*3/uL (ref 4.0–10.5)

## 2015-06-13 NOTE — Progress Notes (Signed)
Pt reports that she has had bleeding with tissue passing.

## 2015-06-13 NOTE — Progress Notes (Signed)
Subjective:   Pt is a 29 y.o. [id number] here for follow-up BHCG.  Upon review of the records patient was first seen on 05/16/15 for pelvic pain.   BHCG on that day was 12751.  Ultrasound showed intrauterine gestational sac with abnormal surrounding decidual reaction, no embryo.  GC/CT and wet prep were collected.  Results were GC/CT neg x 2.   Wet prpe few clue.  Pt discharged home.  Last seen in [facility] on 05/31/15.      Ultrasound completed and consistent with failed pregnancy.      Pt here today for BHCG and reports having bleeding same night as ultrasound (6/10) and began passing clots two days later.  Today bleeding is described as light with no cramping.    Objective: Physical Exam  Filed Vitals:   06/13/15 1355  BP: 116/74  Pulse: 68   Constitutional: She is oriented to person, place, and time. She appears well-developed and well-nourished. No distress.  Neck: Normal range of motion.  Pulmonary/Chest: Effort normal. No respiratory distress.  CVS - RRR Musculoskeletal: Normal range of motion.  ABD - nontender, uterus not enlarged; scant bleeding Neurological: She is alert and oriented to person, place, and time.  Skin: Skin is warm and dry.    Assessment: 29 y.o. [id number] - Post SAB Follow-up BHCG  Plan: Obtain CBC, BHCG Please call pt at [phone] Follow-up prn  Marlis Edelson, CNM

## 2015-06-14 LAB — HCG, QUANTITATIVE, PREGNANCY: HCG, BETA CHAIN, QUANT, S: 20.9 m[IU]/mL

## 2015-06-18 ENCOUNTER — Telehealth: Payer: Self-pay | Admitting: Family

## 2015-06-18 NOTE — Telephone Encounter (Signed)
Called to inform patient desire to repeat BHCG two weeks after prior lab - no answer, voicemail full.

## 2015-08-28 ENCOUNTER — Emergency Department (HOSPITAL_COMMUNITY): Payer: Self-pay

## 2015-08-28 ENCOUNTER — Encounter (HOSPITAL_COMMUNITY): Payer: Self-pay | Admitting: *Deleted

## 2015-08-28 ENCOUNTER — Emergency Department (HOSPITAL_COMMUNITY)
Admission: EM | Admit: 2015-08-28 | Discharge: 2015-08-28 | Disposition: A | Discharge status: Home / Self Care | Payer: Self-pay | Attending: Emergency Medicine | Admitting: Emergency Medicine

## 2015-08-28 DIAGNOSIS — R112 Nausea with vomiting, unspecified: Secondary | ICD-10-CM

## 2015-08-28 DIAGNOSIS — O210 Mild hyperemesis gravidarum: Secondary | ICD-10-CM | POA: Insufficient documentation

## 2015-08-28 DIAGNOSIS — R1030 Lower abdominal pain, unspecified: Secondary | ICD-10-CM | POA: Insufficient documentation

## 2015-08-28 DIAGNOSIS — O30041 Twin pregnancy, dichorionic/diamniotic, first trimester: Secondary | ICD-10-CM | POA: Insufficient documentation

## 2015-08-28 DIAGNOSIS — R102 Pelvic and perineal pain: Secondary | ICD-10-CM

## 2015-08-28 DIAGNOSIS — O131 Gestational [pregnancy-induced] hypertension without significant proteinuria, first trimester: Secondary | ICD-10-CM | POA: Insufficient documentation

## 2015-08-28 DIAGNOSIS — Z880 Allergy status to penicillin: Secondary | ICD-10-CM | POA: Insufficient documentation

## 2015-08-28 DIAGNOSIS — Z3490 Encounter for supervision of normal pregnancy, unspecified, unspecified trimester: Secondary | ICD-10-CM

## 2015-08-28 DIAGNOSIS — Z3A01 Less than 8 weeks gestation of pregnancy: Secondary | ICD-10-CM | POA: Insufficient documentation

## 2015-08-28 LAB — CBC WITH DIFFERENTIAL/PLATELET
BASOS PCT: 0 % (ref 0–1)
Basophils Absolute: 0 10*3/uL (ref 0.0–0.1)
EOS ABS: 0.1 10*3/uL (ref 0.0–0.7)
Eosinophils Relative: 1 % (ref 0–5)
HCT: 39.3 % (ref 36.0–46.0)
Hemoglobin: 13.5 g/dL (ref 12.0–15.0)
Lymphocytes Relative: 19 % (ref 12–46)
Lymphs Abs: 1.8 10*3/uL (ref 0.7–4.0)
MCH: 30.1 pg (ref 26.0–34.0)
MCHC: 34.4 g/dL (ref 30.0–36.0)
MCV: 87.7 fL (ref 78.0–100.0)
MONO ABS: 0.7 10*3/uL (ref 0.1–1.0)
Monocytes Relative: 8 % (ref 3–12)
Neutro Abs: 6.6 10*3/uL (ref 1.7–7.7)
Neutrophils Relative %: 72 % (ref 43–77)
Platelets: 320 10*3/uL (ref 150–400)
RBC: 4.48 MIL/uL (ref 3.87–5.11)
RDW: 12.7 % (ref 11.5–15.5)
WBC: 9.1 10*3/uL (ref 4.0–10.5)

## 2015-08-28 LAB — URINE MICROSCOPIC-ADD ON

## 2015-08-28 LAB — COMPREHENSIVE METABOLIC PANEL
ALBUMIN: 4 g/dL (ref 3.5–5.0)
ALT: 8 U/L — ABNORMAL LOW (ref 14–54)
ANION GAP: 8 (ref 5–15)
AST: 17 U/L (ref 15–41)
Alkaline Phosphatase: 57 U/L (ref 38–126)
BILIRUBIN TOTAL: 0.5 mg/dL (ref 0.3–1.2)
BUN: 9 mg/dL (ref 6–20)
CALCIUM: 9.7 mg/dL (ref 8.9–10.3)
CO2: 23 mmol/L (ref 22–32)
Chloride: 104 mmol/L (ref 101–111)
Creatinine, Ser: 0.72 mg/dL (ref 0.44–1.00)
GFR calc Af Amer: >60 mL/min (ref >60)
GFR calc non Af Amer: >60 mL/min (ref >60)
Glucose, Bld: 86 mg/dL (ref 65–99)
Potassium: 3.2 mmol/L — ABNORMAL LOW (ref 3.5–5.1)
SODIUM: 135 mmol/L (ref 135–145)
TOTAL PROTEIN: 8.3 g/dL — AB (ref 6.5–8.1)

## 2015-08-28 LAB — URINALYSIS, ROUTINE W REFLEX MICROSCOPIC
Glucose, UA: NEGATIVE mg/dL
Hgb urine dipstick: NEGATIVE
Leukocytes, UA: NEGATIVE
NITRITE: NEGATIVE
Protein, ur: 30 mg/dL — AB
Specific Gravity, Urine: 1.035 — ABNORMAL HIGH (ref 1.005–1.030)
Urobilinogen, UA: 1 mg/dL (ref 0.0–1.0)
pH: 6 (ref 5.0–8.0)

## 2015-08-28 LAB — HCG, QUANTITATIVE, PREGNANCY: HCG, BETA CHAIN, QUANT, S: 68311 m[IU]/mL — AB (ref <5)

## 2015-08-28 LAB — PREGNANCY, URINE: PREG TEST UR: POSITIVE — AB

## 2015-08-28 MED ORDER — DIPHENHYDRAMINE HCL 25 MG PO TABS: 25.0000 mg | ORAL_TABLET | Freq: Four times a day (QID) | ORAL | Status: DC

## 2015-08-28 MED ORDER — NITROFURANTOIN MONOHYD MACRO 100 MG PO CAPS: 100.0000 mg | ORAL_CAPSULE | Freq: Two times a day (BID) | ORAL | Status: DC

## 2015-08-28 MED ORDER — ONDANSETRON 4 MG PO TBDP
4.0000 mg | ORAL_TABLET | Freq: Once | ORAL | Status: DC
  Filled 2015-08-28: qty 1

## 2015-08-28 NOTE — Discharge Instructions (Signed)
Abdominal Pain During Pregnancy °Abdominal pain is common in pregnancy. Most of the time, it does not cause harm. There are many causes of abdominal pain. Some causes are more serious than others. Some of the causes of abdominal pain in pregnancy are easily diagnosed. Occasionally, the diagnosis takes time to understand. Other times, the cause is not determined. Abdominal pain can be a sign that something is very wrong with the pregnancy, or the pain may have nothing to do with the pregnancy at all. For this reason, always tell your health care provider if you have any abdominal discomfort. °HOME CARE INSTRUCTIONS  °Monitor your abdominal pain for any changes. The following actions may help to alleviate any discomfort you are experiencing: °· Do not have sexual intercourse or put anything in your vagina until your symptoms go away completely. °· Get plenty of rest until your pain improves. °· Drink clear fluids if you feel nauseous. Avoid solid food as long as you are uncomfortable or nauseous. °· Only take over-the-counter or prescription medicine as directed by your health care provider. °· Keep all follow-up appointments with your health care provider. °SEEK IMMEDIATE MEDICAL CARE IF: °· You are bleeding, leaking fluid, or passing tissue from the vagina. °· You have increasing pain or cramping. °· You have persistent vomiting. °· You have painful or bloody urination. °· You have a fever. °· You notice a decrease in your baby's movements. °· You have extreme weakness or feel faint. °· You have shortness of breath, with or without abdominal pain. °· You develop a severe headache with abdominal pain. °· You have abnormal vaginal discharge with abdominal pain. °· You have persistent diarrhea. °· You have abdominal pain that continues even after rest, or gets worse. °MAKE SURE YOU:  °· Understand these instructions. °· Will watch your condition. °· Will get help right away if you are not doing well or get  worse. °Document Released: 12/07/2005 Document Revised: 09/27/2013 Document Reviewed: 07/06/2013 °ExitCare® Patient Information ©2015 [employer]. This information is not intended to replace advice given to you by your health care provider. Make sure you discuss any questions you have with your health care provider. ° ° °Emergency Department Resource Guide °1) Find a Doctor and Pay Out of Pocket °Although you won't have to find out who is covered by your insurance plan, it is a good idea to ask around and get recommendations. You will then need to call the office and see if the doctor you have chosen will accept you as a new patient and what types of options they offer for patients who are self-pay. Some doctors offer discounts or will set up payment plans for their patients who do not have insurance, but you will need to ask so you aren't surprised when you get to your appointment. ° °2) Contact Your Local Health Department °Not all health departments have doctors that can see patients for sick visits, but many do, so it is worth a call to see if yours does. If you don't know where your local health department is, you can check in your phone book. The CDC also has a tool to help you locate your state's health department, and many state websites also have listings of all of their local health departments. ° °3) Find a Walk-in Clinic °If your illness is not likely to be very severe or complicated, you may want to try a walk in clinic. These are popping up all over the country in pharmacies, drugstores, and shopping   centers. They're usually staffed by nurse practitioners or physician assistants that have been trained to treat common illnesses and complaints. They're usually fairly quick and inexpensive. However, if you have serious medical issues or chronic medical problems, these are probably not your best option. ° °No Primary Care Doctor: °- Call [facility] at  832-8000 - they can help you locate a primary  care doctor that  accepts your insurance, provides certain services, etc. °- Physician Referral Service- 1-800-533-3463 ° °Chronic Pain Problems: °Organization         Address  Phone   Notes  °[facility]  (336) 297-2271 Patients need to be referred by their primary care doctor.  ° °Medication Assistance: °Organization         Address  Phone   Notes  °[place] [facility] [address] °[place], [place] [address] (336) 641-8030 --Must be a resident of [place] °-- Must have NO insurance coverage whatsoever (no Medicaid/ Medicare, etc.) °-- The pt. MUST have a primary care doctor that directs their care regularly and follows them in the community °  °[facility]  (866) 331-1348   °[employer]  (888) 892-1162   ° °Agencies that provide inexpensive medical care: °Organization         Address  Phone   Notes  °[facility]  (336) 832-8035   °[facility]    (336) 832-7272   °[facility] [address] °[place], [place] [address] (336) 832-4777   °[facility] [address], °[place] (336) 271-4999   °[facility]    (336) 373-0678   °[facility]    (336) 272-1050   °[facility] ° [address], [place] Phone:  (336) 832-4444, Fax:  (336) 832-4440 Hours of Operation:  9 am - 6 pm, M-F.  Also accepts Medicaid/Medicare and self-pay.  °[facility] ° [address], [place] Phone: (336) 832-3150, Fax: (336) 832-3151. Hours of Operation:  8:30 am - 5:30 pm, M-F.  Also accepts Medicaid and self-pay.  °[facility] [place] [address], [place] Phone: (336) 878-6027   °[facility] [address], [place],  (336)723-1848, Ext. 123 Mondays & Thursdays: 7-9 AM.  First 15 patients are seen on a first come, first serve basis. °  ° °Medicaid-accepting [place]  Providers: ° °Organization         Address  Phone   Notes  °[facility] [address], [place] (336) 641-2100 Also accepts self-pay patients.  °[facility] [address], [place] ° (336) 856-9996   °[facility] [address], [place] (336) 288-8857   °[facility] [address] [place] [address], [place] (336) 299-7000   °Veita Bland [address], [place]  ° (336) 373-1557 Only accepts [place] Access Medicaid patients after they have their name applied to their card.  ° °Self-Pay (no insurance) in [place]: ° °Organization         Address  Phone   Notes  °Sickle Cell Patients, [facility] [address], [place] (336) 832-1970   °[facility] [address],  (336) 832-4400   °[facility] ° [address], [place] (336) 992-4800   °  [facility]/Dr. Osei-Bonsu ° [address] [place] [address], [place] or [address], [place] (336) 841-8500 Phone number for both [place] and [place] locations is the same.  °[facility] [address], [place] (336) 299-0000   °[facility] [address] [place] [address], [place] or [address] (336) 852-7530 °(336) 878-2260   °[facility] [address], [place] (336) 350-1642, phone; (336) 294-5005, fax Sees patients 1st and 3rd Saturday of every month.  Must not qualify for public or private insurance (i.e. Medicaid, Medicare, [place] Health Choice, Veterans' Benefits) • Household income should be no more than 200% of the poverty level •The clinic cannot treat you if you are pregnant or think you are pregnant • Sexually transmitted diseases are not treated at the clinic.  ° ° °Dental Care: °Organization         Address  Phone  Notes  °[facility] [address], [place] (336) 641-6152 Accepts children up to age 21 who are enrolled in Medicaid or [place] Health Choice; pregnant women with a Medicaid card; and children who have applied for Medicaid or [place] Health Choice, but were declined, whose parents can pay a reduced fee at time of service.  °[facility] [place]  [address], [place] (336) 641-7733 Accepts children up to age 21 who are enrolled in Medicaid or [place] Health Choice; pregnant women with a Medicaid card; and children who have applied for Medicaid or [place] Health Choice, but were declined, whose parents can pay a reduced fee at time of service.  °[facility] ° [address], [place] (336) 641-4533 Patients are seen by appointment only. Walk-ins are not accepted. [facility] will see patients 18 years of age and older. °Monday - Tuesday (8am-5pm) °Most Wednesdays (8:30-5pm) °$30 per visit, cash only  °[facility] ° [address], [place] (336) 641-4533 Patients are seen by appointment only. Walk-ins are not accepted. [facility] will see patients 18 years of age and older. °One Wednesday Evening (Monthly: Volunteer Based).  $30 per visit, cash only  °[facility]  (919) 537-3737 for adults; Children under age 4, call Graduate Pediatric Dentistry at (919) [phone]. Children aged 4-14, please call (919) [phone] to request a pediatric application. ° Dental services are provided in all areas of dental care including fillings, crowns and bridges, complete and partial dentures, implants, gum treatment, root canals, and extractions. Preventive care is also provided. Treatment is provided to both adults and children. °Patients are selected via a lottery and there is often a waiting list. °  °[facility] [address], ° ° (336) 763-8833 [email or web address] °  °[facility]  [address], [place], [place] (336)723-1848, Ext. 123 Second and Fourth Thursday of each month, opens at 6:30 AM; Clinic ends at 9 AM.  Patients are seen on a first-come first-served basis, and a limited number are seen during each clinic.  ° °[facility] ° [address], [place],  (336) 723-7904   Eligibility Requirements °You must have lived in [place], [place], or [place] counties for at least the last three months. °  You cannot be eligible for state or federal sponsored healthcare insurance, including Veterans Administration, Medicaid, or Medicare. °  You generally cannot be eligible for healthcare insurance   through your employer.  °  How to apply: °Eligibility screenings are held every Tuesday and Wednesday afternoon from 1:00 pm until 4:00 pm. You do not need an appointment for the interview!  °[facility] [address], [place], [place] 336-631-2330   °[facility]  336-342-8273   °[facility]  336-703-3100   °[facility]  336-570-6415   ° °Behavioral Health Resources in the Community: °Intensive Outpatient Programs °Organization         Address  Phone  Notes  °[facility] [address], [place], [place] 336-878-6098   °[facility] [address], [place], [place] 336-832-9800   °[facility] [address], [place], [place] ° 336-882-2125   °[facility] [address],  °[place], [place] 1-800-853-5163 or 336-641-4981   °Substance Abuse Resources °Organization         Address  Phone  Notes  °[facility]  336-882-2125   °[facility]  336-784-9470   °[facility]  336-285-9073   °[facility]  336-845-3988   °Residential & Outpatient Substance Abuse Program  1-800-659-3381   °Psychological Services °Organization         Address  Phone  Notes  °[facility]  336- 832-9600    °[facility]  336- 378-7881   °[facility] [address], [place] 1-800-853-5163 or 336-641-4981   ° °Mobile Crisis Teams °Organization         Address  Phone  Notes  °[facility], Mobile Crisis Care Unit  1-877-626-1772   °Assertive °[facility] ° [address] [place], [place] 336-834-9664   °Sharon DeEsch [address] °[place] 336-554-5454   ° °Self-Help/Support Groups °Organization         Address  Phone             Notes  °[facility] [place] - variety of support groups  336- 373-1402 Call for more information  °Narcotics Anonymous (NA), [facility] [address], °[place]  2 meetings at this location  ° °Residential Treatment Programs °Organization         Address  Phone  Notes  °[facility] [address],    °[place]  1-866-801-8205   °[facility] ° [address], [place], [place] 704-293-8524   °[facility] [address], [place] 336-845-3988 Admissions: 8am-3pm M-F  °[facility] [address]    °[place], [place] 336-841-1104   °[facility] [address], [place], [place] 336-379-7146   °[facility] [address]  °[place], [place] 336-285-9073   °[facility] - Intensive Outpatient [address], [place], [place] 336-852-3033   °[facility] [address]  °[place], [place] 1-877-615-2722 or 336-784-9470   °[facility] [address] [place], [place] 336-227-7417 Accepts Medicaid  °[facility] [address]  °[place] 1-800-659-3381 Substance Abuse/Addiction Treatment  ° °[place] Behavioral Health Resources °Organization         Address  Phone  Notes  °[facility]  (888) 581-9988   °Julie Brannon, PhD [address] [place], [place]   (336) 349-5553 or (336) 951-0000    °[facility]   [address] °[place], [place] (336) 349-4454   °  [facility] [address], [place], [place] (336) 342-8316 Insurance/Medicaid/sponsorship through [facility]  °[facility] [address]                                    [place], [place] (336) 342-8316 Therapy/tele-psych/case  °[facility] [address]  ° [place], [place] (336) 349-2233    °Dr. Arfeen  (336) 349-4544   °[facility] [place]  [employer] [place] [facility] 1) [address], [place] °2) [address], [place] °3)  [address], [place] (336) 349-3220 °(336) 342-7768 ° °(336) 342-8140   °[place] [facility] (336) 342-1394 or (336) 342-3537 (After Hours)    ° ° ° °

## 2015-08-28 NOTE — ED Notes (Signed)
Per EMS pt taking shower and states blacked out during shower and found by sister; since yesterday nausea and vomiting and abd pain; bp 148/76 per EMS; no vomiting en route

## 2015-08-28 NOTE — ED Provider Notes (Signed)
CSN: [id number]     Arrival date & time 08/28/15  4098 History   First MD Initiated Contact with Patient 08/28/15 1317     Chief Complaint  Patient presents with  . Abdominal Pain     (Consider location/radiation/quality/duration/timing/severity/associated sxs/prior Treatment) HPI Patient states that yesterday morning she had sausage for breakfast. By about noon she was feeling nauseated and skipped lunch. By evening time she developed vomiting and diffuse lower abdominal pain. She reports the pain is cramping in nature. She reports several more episodes of vomiting today. She denies diarrheal bowel movement. Patient reports she has not had a bowel movement in several days. No recent fever. She reports she thought she might feel better if she took a shower and when she got in the shower she felt lightheaded. She reports her sister found her lying on the floor in the bathroom. She doesn't recall for sure if she eased herself to the floor or she passed out. She denies she has any injuries in association with this. Past Medical History  Diagnosis Date  . Pregnancy induced hypertension    Past Surgical History  Procedure Laterality Date  . Cesarean section    . Breast lumpectomy      left  . Biopsy breast    . Cesarean section  06/28/2012    Procedure: CESAREAN SECTION;  Surgeon: Lesly Dukes, MD;  Location: [facility];  Service: Gynecology;  Laterality: N/A;  . Wisdom tooth extraction      age 70   Family History  Problem Relation Age of Onset  . Cancer Mother 39    breast  . Alcohol abuse Mother   . Depression Mother   . Hypertension Mother   . Depression Sister   . Hypertension Sister   . Asthma Brother   . Depression Brother   . Hypertension Brother   . Alcohol abuse Maternal Aunt   . Hypertension Maternal Aunt   . Cancer Maternal Aunt 65    breast ca  . Alcohol abuse Maternal Uncle   . Hypertension Maternal Uncle   . Alzheimer's disease Maternal Grandmother   . Heart  disease Maternal Grandmother   . Alcohol abuse Maternal Grandmother   . Hypertension Maternal Grandmother   . Heart disease Maternal Grandfather   . Alcohol abuse Maternal Grandfather   . Hypertension Maternal Grandfather   . Cancer Maternal Grandfather     prostate and bone  . Depression Paternal Grandfather    Social History  Substance Use Topics  . Smoking status: Never Smoker   . Smokeless tobacco: Never Used  . Alcohol Use: No   OB History    Gravida Para Term Preterm AB TAB SAB Ectopic Multiple Living   4 2 2  2  0 2   2     Review of Systems  10 Systems reviewed and are negative for acute change except as noted in the HPI.   Allergies  Penicillins  Home Medications   Prior to Admission medications   Medication Sig Start Date End Date Taking? Authorizing Provider  acetaminophen (TYLENOL) 500 MG tablet Take 1,000 mg by mouth every 6 (six) hours as needed for mild pain, moderate pain or headache.    Yes Historical Provider, MD  diphenhydrAMINE (BENADRYL) 25 MG tablet Take 1 tablet (25 mg total) by mouth every 6 (six) hours. 08/28/15   Arby Barrette, MD   BP 128/81 mmHg  Pulse 83  Temp(Src) 98.3 F (36.8 C) (Oral)  Resp 18  SpO2  100% Physical Exam  Constitutional: She is oriented to person, place, and time. She appears well-developed and well-nourished.  HENT:  Head: Normocephalic and atraumatic.  Mouth/Throat: Oropharynx is clear and moist.  Eyes: EOM are normal. Pupils are equal, round, and reactive to light.  Neck: Neck supple.  Cardiovascular: Normal rate, regular rhythm, normal heart sounds and intact distal pulses.   Pulmonary/Chest: Effort normal and breath sounds normal.  Abdominal: Soft. Bowel sounds are normal. She exhibits no distension. There is tenderness (Patient has mild diffuse lower abdominal pain without guarding or rebound.).  Musculoskeletal: Normal range of motion. She exhibits no edema.  Neurological: She is alert and oriented to person,  place, and time. She has normal strength. Coordination normal. GCS eye subscore is 4. GCS verbal subscore is 5. GCS motor subscore is 6.  Skin: Skin is warm, dry and intact.  Psychiatric: She has a normal mood and affect.    ED Course  Procedures (including critical care time) Labs Review Labs Reviewed  COMPREHENSIVE METABOLIC PANEL - Abnormal; Notable for the following:    Potassium 3.2 (*)    Total Protein 8.3 (*)    ALT 8 (*)    All other components within normal limits  URINALYSIS, ROUTINE W REFLEX MICROSCOPIC (NOT AT [facility]) - Abnormal; Notable for the following:    Color, Urine AMBER (*)    APPearance CLOUDY (*)    Specific Gravity, Urine 1.035 (*)    Bilirubin Urine SMALL (*)    Ketones, ur >80 (*)    Protein, ur 30 (*)    All other components within normal limits  PREGNANCY, URINE - Abnormal; Notable for the following:    Preg Test, Ur POSITIVE (*)    All other components within normal limits  HCG, QUANTITATIVE, PREGNANCY - Abnormal; Notable for the following:    hCG, Beta Chain, [place] [address] (*)    All other components within normal limits  URINE MICROSCOPIC-ADD ON - Abnormal; Notable for the following:    Squamous Epithelial / LPF MANY (*)    Bacteria, UA MANY (*)    All other components within normal limits  URINE CULTURE  CBC WITH DIFFERENTIAL/PLATELET    Imaging Review US Ob Transvaginal  08/28/2015   CLINICAL DATA:  Patient with pelvic pain for 2 days.  EXAM: TWIN OBSTETRIC <14WK [place] AND TRANSVAGINAL OB US  COMPARISON:  Pelvic ultrasound 05/31/2015  FINDINGS: TWIN 1  Intrauterine gestational sac: Visualized/normal in shape.  Yolk sac:  Present  Embryo:  Present  Cardiac Activity: Present  Heart Rate: 136 bpm  CRL:  5.1  mm   6 w 2 d  TWIN 2  Intrauterine gestational sac: Visualized/normal in shape.  Yolk sac:  Present  Embryo:  Present  Cardiac Activity: Present  Heart Rate: 124 bpm  CRL:  7.4  Mm   6 w 4 d  Maternal uterus/adnexae: Normal right and left ovaries. No  subchorionic hemorrhage. Trace fluid the pelvis.  IMPRESSION: Live intrauterine twin gestation, currently most suggestive of dichorionic diamniotic pregnancy.   Electronically Signed   By: Annia Belt M.D.   On: 08/28/2015 16:20   US Pelvis Complete  08/28/2015   CLINICAL DATA:  Patient with pelvic pain for 2 days.  EXAM: TWIN OBSTETRIC <14WK [place] AND TRANSVAGINAL OB US  COMPARISON:  Pelvic ultrasound 05/31/2015  FINDINGS: TWIN 1  Intrauterine gestational sac: Visualized/normal in shape.  Yolk sac:  Present  Embryo:  Present  Cardiac Activity: Present  Heart Rate: 136  bpm  CRL:  5.1  mm   6 w 2 d  TWIN 2  Intrauterine gestational sac: Visualized/normal in shape.  Yolk sac:  Present  Embryo:  Present  Cardiac Activity: Present  Heart Rate: 124 bpm  CRL:  7.4  Mm   6 w 4 d  Maternal uterus/adnexae: Normal right and left ovaries. No subchorionic hemorrhage. Trace fluid the pelvis.  IMPRESSION: Live intrauterine twin gestation, currently most suggestive of dichorionic diamniotic pregnancy.   Electronically Signed   By: Annia Belt M.D.   On: 08/28/2015 16:20   I have personally reviewed and evaluated these images and lab results as part of my medical decision-making.   EKG Interpretation None      MDM   Final diagnoses:  Pregnancy  Non-intractable vomiting with nausea, vomiting of unspecified type   Patient presents with vomiting and lower abdominal pain. At this point time pregnancy has been confirmed. There is twin gestation IUP. Patient's urine is contaminated specimen that will be cultured but does not appear suggestive of UTI. She has not had pain burning or urgency. The patient is otherwise well in appearance and will be referred to outpatient OB for further management of her pregnancy.    Arby Barrette, MD 08/28/15 [phone]

## 2015-08-28 NOTE — ED Notes (Signed)
Patient transported to Ultrasound 

## 2015-08-29 ENCOUNTER — Encounter (HOSPITAL_COMMUNITY): Payer: Self-pay | Admitting: *Deleted

## 2015-08-29 ENCOUNTER — Inpatient Hospital Stay (HOSPITAL_COMMUNITY)
Admission: AD | Admit: 2015-08-29 | Discharge: 2015-08-29 | Disposition: A | Discharge status: Home / Self Care | Payer: Self-pay | Source: Ambulatory Visit | Attending: Obstetrics & Gynecology | Admitting: Obstetrics & Gynecology

## 2015-08-29 DIAGNOSIS — Z3A01 Less than 8 weeks gestation of pregnancy: Secondary | ICD-10-CM | POA: Insufficient documentation

## 2015-08-29 DIAGNOSIS — O30009 Twin pregnancy, unspecified number of placenta and unspecified number of amniotic sacs, unspecified trimester: Secondary | ICD-10-CM

## 2015-08-29 DIAGNOSIS — O210 Mild hyperemesis gravidarum: Secondary | ICD-10-CM | POA: Insufficient documentation

## 2015-08-29 DIAGNOSIS — O30001 Twin pregnancy, unspecified number of placenta and unspecified number of amniotic sacs, first trimester: Secondary | ICD-10-CM | POA: Insufficient documentation

## 2015-08-29 DIAGNOSIS — O219 Vomiting of pregnancy, unspecified: Secondary | ICD-10-CM

## 2015-08-29 DIAGNOSIS — Z880 Allergy status to penicillin: Secondary | ICD-10-CM | POA: Insufficient documentation

## 2015-08-29 LAB — URINE MICROSCOPIC-ADD ON

## 2015-08-29 LAB — URINALYSIS, ROUTINE W REFLEX MICROSCOPIC
Glucose, UA: NEGATIVE mg/dL
Hgb urine dipstick: NEGATIVE
Ketones, ur: >80 mg/dL — AB
Leukocytes, UA: NEGATIVE
Nitrite: NEGATIVE
PROTEIN: 30 mg/dL — AB
Specific Gravity, Urine: >1.03 — ABNORMAL HIGH (ref 1.005–1.030)
UROBILINOGEN UA: 2 mg/dL — AB (ref 0.0–1.0)
pH: 6 (ref 5.0–8.0)

## 2015-08-29 MED ORDER — LACTATED RINGERS IV BOLUS (SEPSIS)
1000.0000 mL | Freq: Once | INTRAVENOUS | Status: AC
  Administered 2015-08-29: 1000 mL via INTRAVENOUS

## 2015-08-29 MED ORDER — METOCLOPRAMIDE HCL 10 MG PO TABS: 10.0000 mg | ORAL_TABLET | Freq: Four times a day (QID) | ORAL | Status: DC

## 2015-08-29 MED ORDER — DEXTROSE IN LACTATED RINGERS 5 % IV SOLN
25.0000 mg | Freq: Once | INTRAVENOUS | Status: AC
  Administered 2015-08-29: 25 mg via INTRAVENOUS
  Filled 2015-08-29: qty 1

## 2015-08-29 MED ORDER — PROMETHAZINE HCL 25 MG RE SUPP: 25.0000 mg | Freq: Four times a day (QID) | RECTAL | Status: DC | PRN

## 2015-08-29 MED ORDER — METOCLOPRAMIDE HCL 5 MG/ML IJ SOLN
10.0000 mg | Freq: Once | INTRAMUSCULAR | Status: AC
  Administered 2015-08-29: 10 mg via INTRAVENOUS
  Filled 2015-08-29: qty 2

## 2015-08-29 MED ORDER — PROMETHAZINE HCL 25 MG PO TABS: 25.0000 mg | ORAL_TABLET | Freq: Four times a day (QID) | ORAL | Status: DC | PRN

## 2015-08-29 NOTE — Discharge Instructions (Signed)
Eating Plan for Hyperemesis Gravidarum °Severe cases of hyperemesis gravidarum can lead to dehydration and malnutrition. The hyperemesis eating plan is one way to lessen the symptoms of nausea and vomiting. It is often used with prescribed medicines to control your symptoms.  °WHAT CAN I DO TO RELIEVE MY SYMPTOMS? °Listen to your body. Everyone is different and has different preferences. Find what works best for you. Some of the following things may help: °· Eat and drink slowly. °· Eat 5-6 small meals daily instead of 3 large meals.   °· Eat crackers before you get out of bed in the morning.   °· Starchy foods are usually well tolerated (such as cereal, toast, bread, potatoes, pasta, rice, and pretzels).   °· Ginger may help with nausea. Add ¼ tsp ground ginger to hot tea or choose ginger tea.   °· Try drinking 100% fruit juice or an electrolyte drink. °· Continue to take your prenatal vitamins as directed by your health care provider. If you are having trouble taking your prenatal vitamins, talk with your health care provider about different options. °· Include at least 1 serving of protein with your meals and snacks (such as meats or poultry, beans, nuts, eggs, or yogurt). Try eating a protein-rich snack before bed (such as cheese and crackers or a half turkey or peanut butter sandwich). °WHAT THINGS SHOULD I AVOID TO REDUCE MY SYMPTOMS? °The following things may help reduce your symptoms: °· Avoid foods with strong smells. Try eating meals in well-ventilated areas that are free of odors. °· Avoid drinking water or other beverages with meals. Try not to drink anything less than 30 minutes before and after meals. °· Avoid drinking more than 1 cup of fluid at a time. °· Avoid fried or high-fat foods, such as butter and cream sauces. °· Avoid spicy foods. °· Avoid skipping meals the best you can. Nausea can be more intense on an empty stomach. If you cannot tolerate food at that time, do not force it. Try sucking on  ice chips or other frozen items and make up the calories later. °· Avoid lying down within 2 hours after eating. °Document Released: 10/04/2007 Document Revised: 12/12/2013 Document Reviewed: 10/11/2013 °ExitCare® Patient Information ©2015 [employer]. This information is not intended to replace advice given to you by your health care provider. Make sure you discuss any questions you have with your health care provider. ° °Morning Sickness °Morning sickness is when you feel sick to your stomach (nauseous) during pregnancy. You may feel sick to your stomach and throw up (vomit). You may feel sick in the morning, but you can feel this way any time of day. Some women feel very sick to their stomach and cannot stop throwing up (hyperemesis gravidarum). °HOME CARE °· Only take medicines as told by your doctor. °· Take multivitamins as told by your doctor. Taking multivitamins before getting pregnant can stop or lessen the harshness of morning sickness. °· Eat dry toast or unsalted crackers before getting out of bed. °· Eat 5 to 6 small meals a day. °· Eat dry and bland foods like rice and baked potatoes. °· Do not drink liquids with meals. Drink between meals. °· Do not eat greasy, fatty, or spicy foods. °· Have someone cook for you if the smell of food causes you to feel sick or throw up. °· If you feel sick to your stomach after taking prenatal vitamins, take them at night or with a snack. °· Eat protein when you need a snack (nuts,   yogurt, cheese). °· Eat unsweetened gelatins for dessert. °· Wear a bracelet used for sea sickness (acupressure wristband). °· Go to a doctor that puts thin needles into certain body points (acupuncture) to improve how you feel. °· Do not smoke. °· Use a humidifier to keep the air in your house free of odors. °· Get lots of fresh air. °GET HELP IF: °· You need medicine to feel better. °· You feel dizzy or lightheaded. °· You are losing weight. °GET HELP RIGHT AWAY IF:  °· You feel very  sick to your stomach and cannot stop throwing up. °· You pass out (faint). °MAKE SURE YOU: °· Understand these instructions. °· Will watch your condition. °· Will get help right away if you are not doing well or get worse. °Document Released: 01/14/2005 Document Revised: 12/12/2013 Document Reviewed: 05/24/2013 °ExitCare® Patient Information ©2015 [employer]. This information is not intended to replace advice given to you by your health care provider. Make sure you discuss any questions you have with your health care provider. ° °

## 2015-08-29 NOTE — MAU Note (Signed)
PT SAYS SSHE WENT  TO [facility]  YESTERDAY   BC  SHE  FELL IN SHOWER -   UPT-  POSITIVE .  - U/S-  TWINS-    ZOFRAN AT HOME   -  DOESN'T  WORK       SAYS  VOMITED  2-4  X     PLANS  TO GO TO CLINIC  FOR  [facility].

## 2015-08-29 NOTE — MAU Provider Note (Signed)
History     CSN: [id number]  Arrival date and time: 08/29/15 1933   First Provider Initiated Contact with Patient 08/29/15 2137      No chief complaint on file.  HPI  [name] is a 29 y.o. [phone] at 103w3d with twin gestation who presents to MAU today with complaint of N/V. The patient was seen at [facility] yesterday with abdominal pain and N/V. She had [place] that showed twin IUP at [redacted]w[redacted]d. She denies abdominal pain today. She states that N/V has been all day and she is unable to keep anything down. She denies vaginal bleeding, diarrhea or fever. She has tried Zofran that she already had at home without relief. She plans to return to [facility] for [facility].    OB History    Gravida Para Term Preterm AB TAB SAB Ectopic Multiple Living   5 2 2  2  0 2   2      Past Medical History  Diagnosis Date  . Pregnancy induced hypertension     Past Surgical History  Procedure Laterality Date  . Cesarean section    . Breast lumpectomy      left  . Biopsy breast    . Cesarean section  06/28/2012    Procedure: CESAREAN SECTION;  Surgeon: Lesly Dukes, MD;  Location: [facility];  Service: Gynecology;  Laterality: N/A;  . Wisdom tooth extraction      age 86    Family History  Problem Relation Age of Onset  . Cancer Mother 14    breast  . Alcohol abuse Mother   . Depression Mother   . Hypertension Mother   . Depression Sister   . Hypertension Sister   . Asthma Brother   . Depression Brother   . Hypertension Brother   . Alcohol abuse Maternal Aunt   . Hypertension Maternal Aunt   . Cancer Maternal Aunt 65    breast ca  . Alcohol abuse Maternal Uncle   . Hypertension Maternal Uncle   . Alzheimer's disease Maternal Grandmother   . Heart disease Maternal Grandmother   . Alcohol abuse Maternal Grandmother   . Hypertension Maternal Grandmother   . Heart disease Maternal Grandfather   . Alcohol abuse Maternal Grandfather   . Hypertension Maternal Grandfather   . Cancer Maternal Grandfather      prostate and bone  . Depression Paternal Grandfather     Social History  Substance Use Topics  . Smoking status: Never Smoker   . Smokeless tobacco: Never Used  . Alcohol Use: No    Allergies:  Allergies  Allergen Reactions  . Penicillins Itching and Swelling    Has patient had a PCN reaction causing immediate rash, facial/tongue/throat swelling, SOB or lightheadedness with hypotension: No Has patient had a PCN reaction causing severe rash involving mucus membranes or skin necrosis: No Has patient had a PCN reaction that required hospitalization No Has patient had a PCN reaction occurring within the last 10 years: No If all of the above answers are "NO", then may proceed with Cephalosporin use.     Prescriptions prior to admission  Medication Sig Dispense Refill Last Dose  . ondansetron (ZOFRAN-ODT) 4 MG disintegrating tablet Take 4 mg by mouth every 8 (eight) hours as needed for nausea or vomiting.   08/29/2015 at 1000  . diphenhydrAMINE (BENADRYL) 25 MG tablet Take 1 tablet (25 mg total) by mouth every 6 (six) hours. (Patient not taking: Reported on 08/29/2015) 20 tablet 0 Not Taking  at Unknown time    Review of Systems  Constitutional: Negative for fever and malaise/fatigue.  Gastrointestinal: Positive for nausea and vomiting. Negative for abdominal pain, diarrhea and constipation.  Genitourinary:       Neg - vaginal bleeding   Physical Exam   Blood pressure 107/59, pulse 71, temperature 98 F (36.7 C), temperature source Oral, resp. rate 20, height  (1.651 m), weight 232 lb 6 oz (105.405 kg), last menstrual period 04/08/2015.  Physical Exam  Nursing note and vitals reviewed. Constitutional: She is oriented to person, place, and time. She appears well-developed and well-nourished. No distress.  HENT:  Head: Normocephalic and atraumatic.  Cardiovascular: Normal rate.   Respiratory: Effort normal.  GI: Soft. She exhibits no distension and no mass. There is no  tenderness. There is no rebound and no guarding.  Neurological: She is alert and oriented to person, place, and time.  Skin: Skin is warm and dry. No erythema.  Psychiatric: She has a normal mood and affect.    Results for orders placed or performed during the hospital encounter of 08/29/15 (from the past 24 hour(s))  Urinalysis, Routine w reflex microscopic (not at [facility])     Status: Abnormal   Collection Time: 08/29/15  7:59 PM  Result Value Ref Range   Color, Urine YELLOW YELLOW   APPearance CLEAR CLEAR   Specific Gravity, Urine >1.030 (H) 1.005 - 1.030   pH 6.0 5.0 - 8.0   Glucose, UA NEGATIVE NEGATIVE mg/dL   Hgb urine dipstick NEGATIVE NEGATIVE   Bilirubin Urine SMALL (A) NEGATIVE   Ketones, ur >80 (A) NEGATIVE mg/dL   Protein, ur 30 (A) NEGATIVE mg/dL   Urobilinogen, UA 2.0 (H) 0.0 - 1.0 mg/dL   Nitrite NEGATIVE NEGATIVE   Leukocytes, UA NEGATIVE NEGATIVE  Urine microscopic-add on     Status: Abnormal   Collection Time: 08/29/15  7:59 PM  Result Value Ref Range   Squamous Epithelial / LPF FEW (A) RARE   WBC, UA 0-2 <3 WBC/hpf   Bacteria, UA FEW (A) RARE   Urine-Other MUCOUS PRESENT     MAU Course  Procedures None  MDM UA shows significant dehydration 1 liter IV LR with 10 mg Reglan given 1 liter IV D5LR with 25 mg Phenergan infusion given Patient is resting comfortably at time of discharge. No further emesis in MAU.   Assessment and Plan  A: Twin pregnancy Nausea and vomiting in pregnancy prior to [redacted] weeks gestation  P: Discharge home Rx for Phenergan PO and suppositories and Reglan given to patient First trimester precautions discussed Patient advised to follow-up with [facility] as planned to start prenatal care Pregnancy confirmation letter given Patient may return to MAU as needed or if her condition were to change or worsen   Marny Lowenstein, PA-C  08/29/2015, 9:37 PM

## 2015-08-31 LAB — URINE CULTURE

## 2015-09-01 ENCOUNTER — Telehealth (HOSPITAL_COMMUNITY): Payer: Self-pay | Admitting: Emergency Medicine

## 2015-09-01 NOTE — Telephone Encounter (Signed)
Post ED Visit - Positive Culture Follow-up  Culture report reviewed by antimicrobial stewardship pharmacist:   Celedonio Miyamoto, Pharm.D., BCPS  Georgina Pillion, Pharm.D., BCPS  [place], [address].D., BCPS, AAHIVP  Estella Husk, Pharm.D., BCPS, AAHIVP  Cristy Reyes, [address].D.  Okey Regal, Pharm.D.  Positive Urine culture Treated with Nitrofurantoin, organism sensitive to the same and no further patient follow-up is required at this time.  Jiles Harold 09/01/2015, 12:48 PM

## 2015-09-05 ENCOUNTER — Inpatient Hospital Stay (HOSPITAL_COMMUNITY)
Admission: AD | Admit: 2015-09-05 | Discharge: 2015-09-05 | Disposition: A | Discharge status: Home / Self Care | Payer: Self-pay | Source: Ambulatory Visit | Attending: Family Medicine | Admitting: Family Medicine

## 2015-09-05 ENCOUNTER — Inpatient Hospital Stay (HOSPITAL_COMMUNITY): Payer: Self-pay

## 2015-09-05 ENCOUNTER — Encounter (HOSPITAL_COMMUNITY): Payer: Self-pay

## 2015-09-05 DIAGNOSIS — K5900 Constipation, unspecified: Secondary | ICD-10-CM | POA: Insufficient documentation

## 2015-09-05 DIAGNOSIS — O468X1 Other antepartum hemorrhage, first trimester: Secondary | ICD-10-CM

## 2015-09-05 DIAGNOSIS — O99611 Diseases of the digestive system complicating pregnancy, first trimester: Secondary | ICD-10-CM

## 2015-09-05 DIAGNOSIS — O26899 Other specified pregnancy related conditions, unspecified trimester: Secondary | ICD-10-CM

## 2015-09-05 DIAGNOSIS — O30041 Twin pregnancy, dichorionic/diamniotic, first trimester: Secondary | ICD-10-CM | POA: Insufficient documentation

## 2015-09-05 DIAGNOSIS — O368910 Maternal care for other specified fetal problems, first trimester, not applicable or unspecified: Secondary | ICD-10-CM | POA: Insufficient documentation

## 2015-09-05 DIAGNOSIS — O30049 Twin pregnancy, dichorionic/diamniotic, unspecified trimester: Secondary | ICD-10-CM

## 2015-09-05 DIAGNOSIS — Z880 Allergy status to penicillin: Secondary | ICD-10-CM | POA: Insufficient documentation

## 2015-09-05 DIAGNOSIS — R109 Unspecified abdominal pain: Secondary | ICD-10-CM | POA: Insufficient documentation

## 2015-09-05 DIAGNOSIS — Z3A01 Less than 8 weeks gestation of pregnancy: Secondary | ICD-10-CM | POA: Insufficient documentation

## 2015-09-05 DIAGNOSIS — O418X10 Other specified disorders of amniotic fluid and membranes, first trimester, not applicable or unspecified: Secondary | ICD-10-CM

## 2015-09-05 DIAGNOSIS — O4691 Antepartum hemorrhage, unspecified, first trimester: Secondary | ICD-10-CM

## 2015-09-05 DIAGNOSIS — O209 Hemorrhage in early pregnancy, unspecified: Secondary | ICD-10-CM | POA: Insufficient documentation

## 2015-09-05 LAB — URINALYSIS, ROUTINE W REFLEX MICROSCOPIC
Bilirubin Urine: NEGATIVE
Glucose, UA: NEGATIVE mg/dL
Hgb urine dipstick: NEGATIVE
Ketones, ur: 15 mg/dL — AB
LEUKOCYTES UA: NEGATIVE
NITRITE: NEGATIVE
PROTEIN: NEGATIVE mg/dL
Specific Gravity, Urine: 1.025 (ref 1.005–1.030)
UROBILINOGEN UA: 1 mg/dL (ref 0.0–1.0)
pH: 6 (ref 5.0–8.0)

## 2015-09-05 MED ORDER — FLEET ENEMA 7-19 GM/118ML RE ENEM: 1.0000 | ENEMA | Freq: Once | RECTAL | Status: DC

## 2015-09-05 MED ORDER — FLEET ENEMA 7-19 GM/118ML RE ENEM
1.0000 | ENEMA | Freq: Once | RECTAL | Status: AC
  Administered 2015-09-05: 1 via RECTAL

## 2015-09-05 NOTE — MAU Note (Addendum)
Report Dx twins at [facility] by [place], then came to MAU a few days later for nauses, here today with stopping, "looks like I am miscaring" per pt. , has wash cloth on , black/white cloth, no blood noted. Complaint of back pain and left flank tenderness on palpation

## 2015-09-05 NOTE — MAU Note (Signed)
Last night around 8:00 bright red this morning dark in color only when wipes, having left flank pain, positive CVA tenderness, no recent intercourse, no abdominal pain today, feels like not voiding enough, no BM in 2 weeks.

## 2015-09-05 NOTE — MAU Provider Note (Signed)
History     CSN: [id number]  Arrival date and time: 09/05/15 7829   First Provider Initiated Contact with Patient 09/05/15 0919      Chief Complaint  Patient presents with  . Vaginal Bleeding  . Abdominal Cramping   HPI   [name] is a 29 y.o. female [phone] at [redacted]w[redacted]d twin pregnancy; confirmed IUP,  here today with abdominal pain and vaginal bleeding. She has been seen multiple times with this pregnancy.  The bleeding started last night around 8 pm; she is not currently bleeding. The bleeding is described as dark red, not enough to wear a pad.  This is the first episode of bleeding in this pregnancy.   The pain started yesterday after work; the pain is located in the left side of her back and radiates to her waist.  Plans to start care in the [facility]; plans to call today to schedule an appointment.    She has not had a regular BM in a few days- week.  She has tried a few dose of miralax.   OB History    Gravida Para Term Preterm AB TAB SAB Ectopic Multiple Living   0 2   2      Past Medical History  Diagnosis Date  . Pregnancy induced hypertension     Past Surgical History  Procedure Laterality Date  . Cesarean section    . Breast lumpectomy      left  . Biopsy breast    . Cesarean section  06/28/2012    Procedure: CESAREAN SECTION;  Surgeon: Lesly Dukes, MD;  Location: [facility];  Service: Gynecology;  Laterality: N/A;  . Wisdom tooth extraction      age 29    Family History  Problem Relation Age of Onset  . Cancer Mother 82    breast  . Alcohol abuse Mother   . Depression Mother   . Hypertension Mother   . Depression Sister   . Hypertension Sister   . Asthma Brother   . Depression Brother   . Hypertension Brother   . Alcohol abuse Maternal Aunt   . Hypertension Maternal Aunt   . Cancer Maternal Aunt 65    breast ca  . Alcohol abuse Maternal Uncle   . Hypertension Maternal Uncle   . Alzheimer's disease Maternal Grandmother   .  Heart disease Maternal Grandmother   . Alcohol abuse Maternal Grandmother   . Hypertension Maternal Grandmother   . Heart disease Maternal Grandfather   . Alcohol abuse Maternal Grandfather   . Hypertension Maternal Grandfather   . Cancer Maternal Grandfather     prostate and bone  . Depression Paternal Grandfather     Social History  Substance Use Topics  . Smoking status: Never Smoker   . Smokeless tobacco: Never Used  . Alcohol Use: No    Allergies:  Allergies  Allergen Reactions  . Penicillins Itching and Swelling    Has patient had a PCN reaction causing immediate rash, facial/tongue/throat swelling, SOB or lightheadedness with hypotension: No Has patient had a PCN reaction causing severe rash involving mucus membranes or skin necrosis: No Has patient had a PCN reaction that required hospitalization No Has patient had a PCN reaction occurring within the last 10 years: No If all of the above answers are "NO", then may proceed with Cephalosporin use.     Prescriptions prior to admission  Medication Sig Dispense Refill Last Dose  . metoCLOPramide (REGLAN)  10 MG tablet Take 1 tablet (10 mg total) by mouth every 6 (six) hours. (Patient taking differently: Take 10 mg by mouth every 6 (six) hours as needed for nausea or vomiting. ) 30 tablet 0 09/04/2015 at Unknown time  . promethazine (PHENERGAN) 25 MG tablet Take 1 tablet (25 mg total) by mouth every 6 (six) hours as needed for nausea or vomiting. (Patient taking differently: Take 25 mg by mouth at bedtime as needed for nausea or vomiting. ) 30 tablet 0 09/04/2015 at Unknown time   Results for orders placed or performed during the hospital encounter of 09/05/15 (from the past 48 hour(s))  Urinalysis, Routine w reflex microscopic (not at [facility])     Status: Abnormal   Collection Time: 09/05/15  8:25 AM  Result Value Ref Range   Color, Urine YELLOW YELLOW   APPearance CLEAR CLEAR   Specific Gravity, Urine 1.025 1.005 - 1.030   pH  6.0 5.0 - 8.0   Glucose, UA NEGATIVE NEGATIVE mg/dL   Hgb urine dipstick NEGATIVE NEGATIVE   Bilirubin Urine NEGATIVE NEGATIVE   Ketones, ur 15 (A) NEGATIVE mg/dL   Protein, ur NEGATIVE NEGATIVE mg/dL   Urobilinogen, UA 1.0 0.0 - 1.0 mg/dL   Nitrite NEGATIVE NEGATIVE   Leukocytes, UA NEGATIVE NEGATIVE    Comment: MICROSCOPIC NOT DONE ON URINES WITH NEGATIVE PROTEIN, BLOOD, LEUKOCYTES, NITRITE, OR GLUCOSE <1000 mg/dL.    Review of Systems  Constitutional: Negative for fever and chills.  Gastrointestinal: Positive for nausea, vomiting and abdominal pain.   Physical Exam   Blood pressure 107/63, pulse 80, temperature 97.8 F (36.6 C), temperature source Oral, resp. rate 16, last menstrual period 04/08/2015.  Physical Exam  Constitutional: She is oriented to person, place, and time. She appears well-developed and well-nourished. No distress.  HENT:  Head: Normocephalic.  Eyes: Pupils are equal, round, and reactive to light.  Neck: Neck supple.  GI: Soft. There is tenderness in the suprapubic area. There is no rigidity, no rebound and no guarding.  Genitourinary:  Speculum exam: Vagina - Small amount of thick, dark red blood in the vaginal canal.  Cervix - No active bleeding  Bimanual exam: Cervix closed, No CMT  Uterus non tender, slightly enlarged  Adnexa non tender, no masses bilaterally Chaperone present for exam.  Musculoskeletal: Normal range of motion.  Neurological: She is alert and oriented to person, place, and time.  Skin: Skin is warm. She is not diaphoretic.  Psychiatric: Her behavior is normal.    MAU Course  Procedures  None  MDM  New onset of vaginal bleeding> [place]  B positive blood type   Assessment and Plan   A:  1. Vaginal bleeding in pregnancy, first trimester   2. Abdominal pain in pregnancy, antepartum   3. Dichorionic diamniotic twin gestation, unspecified trimester   4. Subchorionic hematoma in first trimester   5. Constipation during  pregnancy, first trimester    P:  Discharge home in stable condition Pelvic rest Ok to take tylenol as directed on the bottle Start prenatal care ASAP Bleeding precautions Return to MAU as needed, if symptoms worsen A list of safe medications given to the patient for constipation. The patient was sent home with a fleets enema.    Duane Lope, NP 09/05/2015 3:25 PM

## 2015-11-24 ENCOUNTER — Inpatient Hospital Stay (HOSPITAL_COMMUNITY)
Admission: AD | Admit: 2015-11-24 | Discharge: 2015-11-24 | Disposition: A | Discharge status: Home / Self Care | Payer: Self-pay | Source: Ambulatory Visit | Attending: Obstetrics & Gynecology | Admitting: Obstetrics & Gynecology

## 2015-11-24 ENCOUNTER — Encounter (HOSPITAL_COMMUNITY): Payer: Self-pay | Admitting: *Deleted

## 2015-11-24 DIAGNOSIS — R55 Syncope and collapse: Secondary | ICD-10-CM | POA: Insufficient documentation

## 2015-11-24 DIAGNOSIS — Z3A18 18 weeks gestation of pregnancy: Secondary | ICD-10-CM | POA: Insufficient documentation

## 2015-11-24 DIAGNOSIS — Z880 Allergy status to penicillin: Secondary | ICD-10-CM | POA: Insufficient documentation

## 2015-11-24 DIAGNOSIS — O26892 Other specified pregnancy related conditions, second trimester: Secondary | ICD-10-CM | POA: Insufficient documentation

## 2015-11-24 DIAGNOSIS — O0932 Supervision of pregnancy with insufficient antenatal care, second trimester: Secondary | ICD-10-CM | POA: Insufficient documentation

## 2015-11-24 DIAGNOSIS — O30002 Twin pregnancy, unspecified number of placenta and unspecified number of amniotic sacs, second trimester: Secondary | ICD-10-CM

## 2015-11-24 LAB — COMPREHENSIVE METABOLIC PANEL
ALBUMIN: 2.9 g/dL — AB (ref 3.5–5.0)
ALT: 10 U/L — ABNORMAL LOW (ref 14–54)
ANION GAP: 5 (ref 5–15)
AST: 13 U/L — AB (ref 15–41)
Alkaline Phosphatase: 57 U/L (ref 38–126)
BUN: 8 mg/dL (ref 6–20)
CALCIUM: 9.2 mg/dL (ref 8.9–10.3)
CHLORIDE: 104 mmol/L (ref 101–111)
CO2: 25 mmol/L (ref 22–32)
CREATININE: 0.65 mg/dL (ref 0.44–1.00)
GFR calc Af Amer: >60 mL/min (ref >60)
GFR calc non Af Amer: >60 mL/min (ref >60)
GLUCOSE: 81 mg/dL (ref 65–99)
POTASSIUM: 3.4 mmol/L — AB (ref 3.5–5.1)
Sodium: 134 mmol/L — ABNORMAL LOW (ref 135–145)
Total Bilirubin: 0.1 mg/dL — ABNORMAL LOW (ref 0.3–1.2)
Total Protein: 6.4 g/dL — ABNORMAL LOW (ref 6.5–8.1)

## 2015-11-24 LAB — DIFFERENTIAL
BASOS ABS: 0 10*3/uL (ref 0.0–0.1)
BASOS PCT: 0 %
EOS ABS: 0.2 10*3/uL (ref 0.0–0.7)
EOS PCT: 1 %
Lymphocytes Relative: 12 %
Lymphs Abs: 1.5 10*3/uL (ref 0.7–4.0)
Monocytes Absolute: 0.7 10*3/uL (ref 0.1–1.0)
Monocytes Relative: 6 %
NEUTROS PCT: 81 %
Neutro Abs: 10.2 10*3/uL — ABNORMAL HIGH (ref 1.7–7.7)

## 2015-11-24 LAB — CBC
HCT: 32.1 % — ABNORMAL LOW (ref 36.0–46.0)
HEMOGLOBIN: 10.6 g/dL — AB (ref 12.0–15.0)
MCH: 28.9 pg (ref 26.0–34.0)
MCHC: 33 g/dL (ref 30.0–36.0)
MCV: 87.5 fL (ref 78.0–100.0)
PLATELETS: 235 10*3/uL (ref 150–400)
RBC: 3.67 MIL/uL — ABNORMAL LOW (ref 3.87–5.11)
RDW: 13.3 % (ref 11.5–15.5)
WBC: 12.5 10*3/uL — ABNORMAL HIGH (ref 4.0–10.5)

## 2015-11-24 LAB — HEPATITIS B SURFACE ANTIGEN: HEP B S AG: NEGATIVE

## 2015-11-24 MED ORDER — PRENATAL VITAMINS PLUS 27-1 MG PO TABS: 1.0000 | ORAL_TABLET | Freq: Every day | ORAL | Status: DC

## 2015-11-24 NOTE — MAU Note (Signed)
Pt was at the grocery store and started to feel hot and sweaty so she went to go sit down and the next thing she remembers is someone helping her up off of the floor.  She said that she hit the side of her head and belly.  She passed out in the beginning of the pregnancy, so she went to the ED and that's how she found out she was pregnant with twins.  No prenatal care so far with this pregnancy, only been seen in the ED.  Denies LOF/bleeding.

## 2015-11-24 NOTE — Discharge Instructions (Signed)
Expect a call for an appointment in the clinic downstairs for prenatal care. Expect a call for an ultrasound appointment. Drink at least 8 8-oz glasses of water every day. Eat meals and snacks every 3-4 hours.  Do not skip meals. Take a prenatal vitamin by mouth every day. No smoking, no drugs, no alcohol. Return if you have vaginal bleeding or severe abdominal pain or severe headache.

## 2015-11-24 NOTE — MAU Provider Note (Signed)
History     CSN: [id number]  Arrival date and time: 11/24/15 1428   First Provider Initiated Contact with Patient 11/24/15 1505      Chief Complaint  Patient presents with  . Loss of Consciousness   HPI Anne Richardson 29 y.o. [redacted]w[redacted]d with twin gestation.  Today ate breakfast and went to the grocery store.  Was using the motorized cart.  Stood up and started to feel faint.  Went to sit down on the cart and then passed out.  The store security guard tells her she hit her right temple and right side of her abdomen when falling.  Currently her head feels sore and she has abdominal pain on the left side.  She was seen in MAU and given the diagnosis of twins.  She has not been able to be seen as she was waiting on her Medicaid card.  OB History    Gravida Para Term Preterm AB TAB SAB Ectopic Multiple Living   0 2   2      Past Medical History  Diagnosis Date  . Pregnancy induced hypertension     Past Surgical History  Procedure Laterality Date  . Cesarean section    . Breast lumpectomy      left  . Biopsy breast    . Cesarean section  06/28/2012    Procedure: CESAREAN SECTION;  Surgeon: Lesly Dukes, MD;  Location: [facility];  Service: Gynecology;  Laterality: N/A;  . Wisdom tooth extraction      age 10    Family History  Problem Relation Age of Onset  . Cancer Mother 56    breast  . Alcohol abuse Mother   . Depression Mother   . Hypertension Mother   . Depression Sister   . Hypertension Sister   . Asthma Brother   . Depression Brother   . Hypertension Brother   . Alcohol abuse Maternal Aunt   . Hypertension Maternal Aunt   . Cancer Maternal Aunt 65    breast ca  . Alcohol abuse Maternal Uncle   . Hypertension Maternal Uncle   . Alzheimer's disease Maternal Grandmother   . Heart disease Maternal Grandmother   . Alcohol abuse Maternal Grandmother   . Hypertension Maternal Grandmother   . Heart disease Maternal Grandfather   . Alcohol abuse Maternal  Grandfather   . Hypertension Maternal Grandfather   . Cancer Maternal Grandfather     prostate and bone  . Depression Paternal Grandfather     Social History  Substance Use Topics  . Smoking status: Never Smoker   . Smokeless tobacco: Never Used  . Alcohol Use: No    Allergies:  Allergies  Allergen Reactions  . Penicillins Itching and Swelling    Has patient had a PCN reaction causing immediate rash, facial/tongue/throat swelling, SOB or lightheadedness with hypotension: No Has patient had a PCN reaction causing severe rash involving mucus membranes or skin necrosis: No Has patient had a PCN reaction that required hospitalization No Has patient had a PCN reaction occurring within the last 10 years: No If all of the above answers are "NO", then may proceed with Cephalosporin use.     Prescriptions prior to admission  Medication Sig Dispense Refill Last Dose  . metoCLOPramide (REGLAN) 10 MG tablet Take 1 tablet (10 mg total) by mouth every 6 (six) hours. (Patient not taking: Reported on 11/24/2015) 30 tablet 0 09/04/2015 at Unknown time  . promethazine (PHENERGAN) 25  MG tablet Take 1 tablet (25 mg total) by mouth every 6 (six) hours as needed for nausea or vomiting. (Patient not taking: Reported on 11/24/2015) 30 tablet 0 09/04/2015 at Unknown time  . sodium phosphate (FLEET) 7-19 GM/118ML ENEM Place 133 mLs (1 enema total) rectally once. (Patient not taking: Reported on 11/24/2015) 1 Bottle 1     Review of Systems  Constitutional: Negative for fever.  HENT:       Right temple feels sore.  Eyes: Negative for blurred vision and double vision.  Gastrointestinal: Negative for nausea, vomiting and abdominal pain.  Genitourinary:       No vaginal discharge. No vaginal bleeding. No dysuria.   Physical Exam   Last menstrual period 04/08/2015.   Physical Exam  Nursing note and vitals reviewed. Constitutional: She is oriented to person, place, and time. She appears well-developed  and well-nourished.  HENT:  Head: Normocephalic.  Eyes: EOM are normal.  Neck: Neck supple.  GI: Soft. There is no tenderness. There is no rebound and no guarding.  Client is obese.  Fundus is between umbilicus and ziphoid process.  Musculoskeletal: Normal range of motion.  Neurological: She is alert and oriented to person, place, and time.  Eyes move symmetrical with no nystagmus Facial movements symmetrical. Speech normal.  Tongue movement symmetrically Palate movement is symmetrical  Grip in hands and muscle strength in arms and legs equal and strong. No weakness noted in any area.  Skin: Skin is warm and dry.  Psychiatric: She has a normal mood and affect.    MAU Course  Procedures Results for orders placed or performed during the hospital encounter of 11/24/15 (from the past 24 hour(s))  CBC     Status: Abnormal   Collection Time: 11/24/15  4:20 PM  Result Value Ref Range   WBC 12.5 (H) 4.0 - 10.5 K/uL   RBC 3.67 (L) 3.87 - 5.11 MIL/uL   Hemoglobin 10.6 (L) 12.0 - 15.0 g/dL   HCT 14.732.1 (L) 82.936.0 - 56.246.0 %   MCV 87.5 78.0 - 100.0 fL   MCH 28.9 26.0 - 34.0 pg   MCHC 33.0 30.0 - 36.0 g/dL   RDW 13.013.3 86.511.5 - 78.415.5 %   Platelets 235 150 - 400 K/uL  Differential     Status: Abnormal   Collection Time: 11/24/15  4:20 PM  Result Value Ref Range   Neutrophils Relative % 81 %   Neutro Abs 10.2 (H) 1.7 - 7.7 K/uL   Lymphocytes Relative 12 %   Lymphs Abs 1.5 0.7 - 4.0 K/uL   Monocytes Relative 6 %   Monocytes Absolute 0.7 0.1 - 1.0 K/uL   Eosinophils Relative 1 %   Eosinophils Absolute 0.2 0.0 - 0.7 K/uL   Basophils Relative 0 %   Basophils Absolute 0.0 0.0 - 0.1 K/uL  Comprehensive metabolic panel     Status: Abnormal   Collection Time: 11/24/15  4:20 PM  Result Value Ref Range   Sodium 134 (L) 135 - 145 mmol/L   Potassium 3.4 (L) 3.5 - 5.1 mmol/L   Chloride 104 101 - 111 mmol/L   CO2 25 22 - 32 mmol/L   Glucose, Bld 81 65 - 99 mg/dL   BUN 8 6 - 20 mg/dL   Creatinine, Ser  6.960.65 0.44 - 1.00 mg/dL   Calcium 9.2 8.9 - 29.510.3 mg/dL   Total Protein 6.4 (L) 6.5 - 8.1 g/dL   Albumin 2.9 (L) 3.5 - 5.0 g/dL   AST 13 (L)  15 - 41 U/L   ALT 10 (L) 14 - 54 U/L   Alkaline Phosphatase 57 38 - 126 U/L   Total Bilirubin 0.1 (L) 0.3 - 1.2 mg/dL   GFR calc non Af Amer >60 >60 mL/min   GFR calc Af Amer >60 >60 mL/min   Anion gap 5 5 - 15    MDM Consult with Dr. Penne Lash and reviewed the plan of care. Orthostatic vitals are normal. [facility] sent a message to schedule appointment. Message sent to ultrasound to schedule outpatient appointment.  Assessment and Plan  Twin gestation at 18 weeks No prenatal care Syncopal episode today  Plan See MDM and discharge summary.   Brandom Kerwin 11/24/2015, 3:05 PM

## 2015-11-25 LAB — RPR: RPR: NONREACTIVE

## 2015-11-25 LAB — RUBELLA SCREEN: RUBELLA: 2.5 {index} (ref >0.99)

## 2015-11-25 LAB — HIV ANTIBODY (ROUTINE TESTING W REFLEX): HIV SCREEN 4TH GENERATION: NONREACTIVE

## 2015-11-26 LAB — SICKLE CELL SCREEN: Sickle Cell Screen: NEGATIVE

## 2015-11-27 ENCOUNTER — Ambulatory Visit (INDEPENDENT_AMBULATORY_CARE_PROVIDER_SITE_OTHER): Payer: Self-pay | Admitting: Obstetrics & Gynecology

## 2015-11-27 VITALS — BP 119/59 | HR 89 | Temp 98.0°F | Wt 254.4 lb

## 2015-11-27 DIAGNOSIS — O99210 Obesity complicating pregnancy, unspecified trimester: Secondary | ICD-10-CM

## 2015-11-27 DIAGNOSIS — Z23 Encounter for immunization: Secondary | ICD-10-CM

## 2015-11-27 DIAGNOSIS — O30042 Twin pregnancy, dichorionic/diamniotic, second trimester: Secondary | ICD-10-CM

## 2015-11-27 DIAGNOSIS — Z124 Encounter for screening for malignant neoplasm of cervix: Secondary | ICD-10-CM

## 2015-11-27 DIAGNOSIS — O0992 Supervision of high risk pregnancy, unspecified, second trimester: Secondary | ICD-10-CM

## 2015-11-27 DIAGNOSIS — O30049 Twin pregnancy, dichorionic/diamniotic, unspecified trimester: Secondary | ICD-10-CM | POA: Insufficient documentation

## 2015-11-27 DIAGNOSIS — Z1151 Encounter for screening for human papillomavirus (HPV): Secondary | ICD-10-CM

## 2015-11-27 DIAGNOSIS — R55 Syncope and collapse: Secondary | ICD-10-CM | POA: Insufficient documentation

## 2015-11-27 DIAGNOSIS — R8271 Bacteriuria: Secondary | ICD-10-CM

## 2015-11-27 DIAGNOSIS — O0990 Supervision of high risk pregnancy, unspecified, unspecified trimester: Secondary | ICD-10-CM | POA: Insufficient documentation

## 2015-11-27 HISTORY — DX: Obesity complicating pregnancy, unspecified trimester: O99.210

## 2015-11-27 LAB — POCT URINALYSIS DIP (DEVICE)
BILIRUBIN URINE: NEGATIVE
GLUCOSE, UA: NEGATIVE mg/dL
HGB URINE DIPSTICK: NEGATIVE
Ketones, ur: NEGATIVE mg/dL
LEUKOCYTES UA: NEGATIVE
NITRITE: NEGATIVE
Protein, ur: 30 mg/dL — AB
UROBILINOGEN UA: 0.2 mg/dL (ref 0.0–1.0)
pH: 6 (ref 5.0–8.0)

## 2015-11-27 NOTE — Progress Notes (Signed)
Subjective:    Anne Richardson is a 29 y.o. [id number] at [redacted]w[redacted]d with di-di twin gestation being seen today for her first obstetrical visit.  Her obstetrical history is significant for two previous cesarean sections. Patient does intend to breast feed. Pregnancy history fully reviewed.  Patient reports no complaints, had nausea which is now resolving. Now eats a lot.  Filed Vitals:   11/27/15 1334  BP: 119/59  Pulse: 89  Temp: 98 F (36.7 C)  Weight: 254 lb 6.4 oz (115.395 kg)    HISTORY: OB History  Gravida Para Term Preterm AB SAB TAB Ectopic Multiple Living  0   2    # Outcome Date GA Lbr Len/2nd Weight Sex Delivery Anes PTL Lv  5 Current           4 Term 06/28/12 [redacted]w[redacted]d  9 lb 14.1 oz (4.483 kg) F CS-LTranv Spinal  Y  3 Term 12/13/10   7 lb 13 oz (3.544 kg) F CS-LTranv   Y     Comments: high blood pressure, fever, meconium  2 SAB 12/05/09          1 SAB              Past Medical History  Diagnosis Date  . Pregnancy induced hypertension    Past Surgical History  Procedure Laterality Date  . Cesarean section    . Breast lumpectomy      left  . Biopsy breast    . Cesarean section  06/28/2012    Procedure: CESAREAN SECTION;  Surgeon: Lesly Dukes, MD;  Location: [facility];  Service: Gynecology;  Laterality: N/A;  . Wisdom tooth extraction      age 67   Family History  Problem Relation Age of Onset  . Cancer Mother 49    breast  . Alcohol abuse Mother   . Depression Mother   . Hypertension Mother   . Depression Sister   . Hypertension Sister   . Asthma Brother   . Depression Brother   . Hypertension Brother   . Alcohol abuse Maternal Aunt   . Hypertension Maternal Aunt   . Cancer Maternal Aunt 65    breast ca  . Alcohol abuse Maternal Uncle   . Hypertension Maternal Uncle   . Alzheimer's disease Maternal Grandmother   . Heart disease Maternal Grandmother   . Alcohol abuse Maternal Grandmother   . Hypertension Maternal Grandmother   . Heart  disease Maternal Grandfather   . Alcohol abuse Maternal Grandfather   . Hypertension Maternal Grandfather   . Cancer Maternal Grandfather     prostate and bone  . Depression Paternal Grandfather      Exam    Uterus:     Pelvic Exam:    Perineum: No Hemorrhoids, Normal Perineum   Vulva: normal   Vagina:  normal mucosa, normal discharge   Cervix: no bleeding following Pap, no cervical motion tenderness and no lesions   Adnexa: normal adnexa and no mass, fullness, tenderness   Bony Pelvis: average  System: Breast:  normal appearance, no masses or tenderness   Skin: normal coloration and turgor, no rashes   Neurologic: oriented, normal, negative   Extremities: normal strength, tone, and muscle mass, no deformities   HEENT PERRLA, extra ocular movement intact and sclera clear, anicteric   Mouth/Teeth mucous membranes moist, pharynx normal without lesions and dental hygiene good   Neck supple and no masses   Cardiovascular: regular rate  and rhythm   Respiratory:  appears well, vitals normal, no respiratory distress, acyanotic, normal RR, chest clear, no wheezing, crepitations, rhonchi, normal symmetric air entry   Abdomen: soft, non-tender; bowel sounds normal; no masses,  no organomegaly   Urinary: urethral meatus normal      Assessment:    Pregnancy: [id number] Patient Active Problem List   Diagnosis Date Noted  . Obesity in pregnancy, antepartum 11/27/2015  . Twin gestation, dichorionic diamniotic 11/27/2015  . Supervision of high-risk pregnancy 11/27/2015  . Previous cesarean delivery x 2, antepartum 01/18/2012        Plan:   Initial labs drawn. Continue prenatal vitamins. Problem list reviewed and updated. Genetic Screening discussed Quad Screen: ordered. Ultrasound discussed; fetal survey: ordered. Follow up in 4 weeks.  Routine obstetric precautions reviewed.    Tereso NewcomerANYANWU,Deaysia Grigoryan A, MD 11/27/2015

## 2015-11-27 NOTE — Progress Notes (Signed)
Pressure- pelvic, lower back  Early 1hr glucola due to BMI>30 Anatomy US scheduled for December 23rd @ 1300

## 2015-11-28 LAB — PRESCRIPTION MONITORING PROFILE (19 PANEL)
Amphetamine/Meth: NEGATIVE ng/mL
BARBITURATE SCREEN, URINE: NEGATIVE ng/mL
BENZODIAZEPINE SCREEN, URINE: NEGATIVE ng/mL
BUPRENORPHINE, URINE: NEGATIVE ng/mL
CREATININE, URINE: 247.4 mg/dL (ref >20.0)
Cannabinoid Scrn, Ur: NEGATIVE ng/mL
Carisoprodol, Urine: NEGATIVE ng/mL
Cocaine Metabolites: NEGATIVE ng/mL
ECSTASY: NEGATIVE ng/mL
FENTANYL URINE: NEGATIVE ng/mL
METHAQUALONE SCREEN (URINE): NEGATIVE ng/mL
Meperidine, Ur: NEGATIVE ng/mL
Methadone Screen, Urine: NEGATIVE ng/mL
NITRITES URINE, INITIAL: NEGATIVE ug/mL
OPIATE SCREEN, URINE: NEGATIVE ng/mL
Oxycodone Screen, Ur: NEGATIVE ng/mL
PROPOXYPHENE: NEGATIVE ng/mL
Phencyclidine, Ur: NEGATIVE ng/mL
TRAMADOL UR: NEGATIVE ng/mL
Tapentadol, urine: NEGATIVE ng/mL
ZOLPIDEM, URINE: NEGATIVE ng/mL
pH, Initial: 6 pH (ref 4.5–8.9)

## 2015-11-28 LAB — AFP, QUAD SCREEN
AFP: 83.4 ng/mL
CURR GEST AGE: 19.2 wks.days
HCG TOTAL: 64.86 [IU]/mL
INH: 245.3 pg/mL
INTERPRETATION-AFP: NEGATIVE
MOM FOR AFP: 2.38
MOM FOR HCG: 3.87
MoM for INH: 1.89
Open Spina bifida: NEGATIVE
Osb Risk: 1:858 {titer}
UE3 MOM: 1.08
UE3 VALUE: 1.58 ng/mL

## 2015-11-28 LAB — ANTIBODY SCREEN: ANTIBODY SCREEN: NEGATIVE

## 2015-11-28 LAB — GLUCOSE TOLERANCE, 1 HOUR (50G) W/O FASTING: GLUCOSE 1 HOUR GTT: 56 mg/dL — AB (ref 70–140)

## 2015-11-28 LAB — ABO AND RH: Rh Type: POSITIVE

## 2015-11-30 LAB — CULTURE, OB URINE: Colony Count: 65000

## 2015-12-01 ENCOUNTER — Encounter: Payer: Self-pay | Admitting: Obstetrics & Gynecology

## 2015-12-01 DIAGNOSIS — R8271 Bacteriuria: Secondary | ICD-10-CM | POA: Insufficient documentation

## 2015-12-01 LAB — OB RESULTS CONSOLE GBS: STREP GROUP B AG: POSITIVE

## 2015-12-01 MED ORDER — CEPHALEXIN 500 MG PO CAPS: 500.0000 mg | ORAL_CAPSULE | Freq: Four times a day (QID) | ORAL | Status: DC

## 2015-12-01 NOTE — Addendum Note (Signed)
Addended by: Jaynie CollinsANYANWU, Zayli Villafuerte A on: 12/01/2015 02:40 PM   Modules accepted: Orders

## 2015-12-02 ENCOUNTER — Encounter: Payer: Self-pay | Admitting: *Deleted

## 2015-12-02 LAB — CYTOLOGY - PAP

## 2015-12-13 ENCOUNTER — Other Ambulatory Visit: Payer: Self-pay | Admitting: Obstetrics & Gynecology

## 2015-12-13 ENCOUNTER — Ambulatory Visit (HOSPITAL_COMMUNITY)
Admission: RE | Admit: 2015-12-13 | Discharge: 2015-12-13 | Disposition: A | Discharge status: Home / Self Care | Payer: Self-pay | Source: Ambulatory Visit | Attending: Nurse Practitioner | Admitting: Nurse Practitioner

## 2015-12-13 ENCOUNTER — Other Ambulatory Visit (HOSPITAL_COMMUNITY): Payer: Self-pay | Admitting: Nurse Practitioner

## 2015-12-13 ENCOUNTER — Encounter (HOSPITAL_COMMUNITY): Payer: Self-pay

## 2015-12-13 VITALS — BP 109/65 | HR 89 | Wt 260.8 lb

## 2015-12-13 DIAGNOSIS — Z3A21 21 weeks gestation of pregnancy: Secondary | ICD-10-CM

## 2015-12-13 DIAGNOSIS — Z3689 Encounter for other specified antenatal screening: Secondary | ICD-10-CM

## 2015-12-13 DIAGNOSIS — O30002 Twin pregnancy, unspecified number of placenta and unspecified number of amniotic sacs, second trimester: Secondary | ICD-10-CM

## 2015-12-13 DIAGNOSIS — Z36 Encounter for antenatal screening of mother: Secondary | ICD-10-CM | POA: Insufficient documentation

## 2015-12-13 DIAGNOSIS — R55 Syncope and collapse: Secondary | ICD-10-CM

## 2015-12-13 DIAGNOSIS — O99212 Obesity complicating pregnancy, second trimester: Secondary | ICD-10-CM

## 2015-12-13 DIAGNOSIS — O9A212 Injury, poisoning and certain other consequences of external causes complicating pregnancy, second trimester: Secondary | ICD-10-CM

## 2015-12-13 DIAGNOSIS — O34219 Maternal care for unspecified type scar from previous cesarean delivery: Secondary | ICD-10-CM

## 2015-12-13 DIAGNOSIS — O30042 Twin pregnancy, dichorionic/diamniotic, second trimester: Secondary | ICD-10-CM

## 2015-12-13 DIAGNOSIS — O0932 Supervision of pregnancy with insufficient antenatal care, second trimester: Secondary | ICD-10-CM

## 2015-12-13 DIAGNOSIS — O30049 Twin pregnancy, dichorionic/diamniotic, unspecified trimester: Secondary | ICD-10-CM

## 2015-12-25 ENCOUNTER — Ambulatory Visit (INDEPENDENT_AMBULATORY_CARE_PROVIDER_SITE_OTHER): Payer: Self-pay | Admitting: Family

## 2015-12-25 VITALS — BP 130/69 | HR 89 | Temp 98.0°F | Wt 265.5 lb

## 2015-12-25 DIAGNOSIS — O3680X2 Pregnancy with inconclusive fetal viability, fetus 2: Secondary | ICD-10-CM

## 2015-12-25 DIAGNOSIS — O0993 Supervision of high risk pregnancy, unspecified, third trimester: Secondary | ICD-10-CM

## 2015-12-25 DIAGNOSIS — O3680X0 Pregnancy with inconclusive fetal viability, not applicable or unspecified: Secondary | ICD-10-CM

## 2015-12-25 DIAGNOSIS — O30049 Twin pregnancy, dichorionic/diamniotic, unspecified trimester: Secondary | ICD-10-CM

## 2015-12-25 DIAGNOSIS — O3680X1 Pregnancy with inconclusive fetal viability, fetus 1: Secondary | ICD-10-CM

## 2015-12-25 DIAGNOSIS — Z36 Encounter for antenatal screening of mother: Secondary | ICD-10-CM

## 2015-12-25 DIAGNOSIS — O30042 Twin pregnancy, dichorionic/diamniotic, second trimester: Secondary | ICD-10-CM

## 2015-12-25 LAB — POCT URINALYSIS DIP (DEVICE)
BILIRUBIN URINE: NEGATIVE
GLUCOSE, UA: NEGATIVE mg/dL
HGB URINE DIPSTICK: NEGATIVE
KETONES UR: NEGATIVE mg/dL
Leukocytes, UA: NEGATIVE
NITRITE: NEGATIVE
PROTEIN: NEGATIVE mg/dL
Specific Gravity, Urine: >=1.03 (ref 1.005–1.030)
UROBILINOGEN UA: 0.2 mg/dL (ref 0.0–1.0)
pH: 5.5 (ref 5.0–8.0)

## 2015-12-25 NOTE — Progress Notes (Signed)
Breastfeeding tip of the week reviewed.  Bedside US for FHT's = Twin A on maternal Rt (vertex) - 137 bpm per PW doppler, Twin B on maternal Lt (vertex) - 132 per PW doppler

## 2015-12-25 NOTE — Progress Notes (Signed)
Subjective:  Anne Richardson is a 30 y.o. [phone] at 5828w2d being seen today for ongoing prenatal care.  She is currently monitored for the following issues for this high-risk pregnancy and has Previous cesarean delivery x 2, antepartum; Obesity in pregnancy, antepartum; Twin gestation, dichorionic diamniotic; Supervision of high-risk pregnancy; and Group B streptococcal bacteriuria on her problem list.  Patient reports no complaints.  Contractions: Not present. Vag. Bleeding: None.  Movement: Present. Denies leaking of fluid.   The following portions of the patient's history were reviewed and updated as appropriate: allergies, current medications, past family history, past medical history, past social history, past surgical history and problem list. Problem list updated.  Objective:   Filed Vitals:   12/25/15 1108  BP: 130/69  Pulse: 89  Temp: 98 F (36.7 C)  Weight: 265 lb 8 oz (120.43 kg)    Fetal Status: Fetal Heart Rate (bpm): 137/132 Fundal Height: 28 cm Movement: Present     General:  Alert, oriented and cooperative. Patient is in no acute distress.  Skin: Skin is warm and dry. No rash noted.   Cardiovascular: Normal heart rate noted  Respiratory: Normal respiratory effort, no problems with respiration noted  Abdomen: Soft, gravid, appropriate for gestational age. Pain/Pressure: Present     Pelvic: Vag. Bleeding: None     Cervical exam deferred        Extremities: Normal range of motion.  Edema: None  Mental Status: Normal mood and affect. Normal behavior. Normal judgment and thought content.   Urinalysis: Urine Protein: Negative Urine Glucose: Negative  Assessment and Plan:  Pregnancy: [id number] at 7628w2d  1. Supervision of high-risk pregnancy, third trimester - Continue monitoring  2. Examination to determine fetal viability of pregnancy, fetus 1 - FHT's obtained  3. Examination to determine fetal viability of pregnancy, fetus 2 - FHT's obtained  4. Dichorionic  diamniotic twin gestation, unspecified trimester - Growth ultrasound scheduled for 01/08/16  Preterm labor symptoms and general obstetric precautions including but not limited to vaginal bleeding, contractions, leaking of fluid and fetal movement were reviewed in detail with the patient. Please refer to After Visit Summary for other counseling recommendations.  No Follow-up on file.   Eino FarberWalidah Kennith GainN Karim, CNM

## 2016-01-08 ENCOUNTER — Ambulatory Visit (HOSPITAL_COMMUNITY)
Admission: RE | Admit: 2016-01-08 | Discharge: 2016-01-08 | Disposition: A | Discharge status: Home / Self Care | Payer: Self-pay | Source: Ambulatory Visit | Attending: Nurse Practitioner | Admitting: Nurse Practitioner

## 2016-01-08 VITALS — BP 118/72 | HR 88 | Wt 272.6 lb

## 2016-01-08 DIAGNOSIS — Z3A25 25 weeks gestation of pregnancy: Secondary | ICD-10-CM | POA: Insufficient documentation

## 2016-01-08 DIAGNOSIS — Z36 Encounter for antenatal screening of mother: Secondary | ICD-10-CM | POA: Insufficient documentation

## 2016-01-08 DIAGNOSIS — O99212 Obesity complicating pregnancy, second trimester: Secondary | ICD-10-CM | POA: Insufficient documentation

## 2016-01-08 DIAGNOSIS — O0932 Supervision of pregnancy with insufficient antenatal care, second trimester: Secondary | ICD-10-CM | POA: Insufficient documentation

## 2016-01-08 DIAGNOSIS — O30049 Twin pregnancy, dichorionic/diamniotic, unspecified trimester: Secondary | ICD-10-CM

## 2016-01-08 DIAGNOSIS — O34219 Maternal care for unspecified type scar from previous cesarean delivery: Secondary | ICD-10-CM | POA: Insufficient documentation

## 2016-01-08 DIAGNOSIS — O30042 Twin pregnancy, dichorionic/diamniotic, second trimester: Secondary | ICD-10-CM | POA: Insufficient documentation

## 2016-01-20 ENCOUNTER — Telehealth: Payer: Self-pay

## 2016-01-20 NOTE — Telephone Encounter (Signed)
Pt called and stated that she is having pressure and pain in her groin and wants to know if this is normal?  Called pt and informed pt that what she is feeling is normal symptoms of pregnancy.  Pt has an appt scheduled on 01/22/16, I informed pt that she can be evaluated at that time.  Pt stated understanding.

## 2016-01-22 ENCOUNTER — Encounter: Payer: Self-pay | Admitting: Advanced Practice Midwife

## 2016-01-24 ENCOUNTER — Inpatient Hospital Stay (HOSPITAL_COMMUNITY)
Admission: AD | Admit: 2016-01-24 | Discharge: 2016-01-25 | Disposition: A | Discharge status: Home / Self Care | Payer: Self-pay | Source: Ambulatory Visit | Attending: Obstetrics & Gynecology | Admitting: Obstetrics & Gynecology

## 2016-01-24 DIAGNOSIS — O30042 Twin pregnancy, dichorionic/diamniotic, second trimester: Secondary | ICD-10-CM | POA: Insufficient documentation

## 2016-01-24 DIAGNOSIS — R102 Pelvic and perineal pain: Secondary | ICD-10-CM | POA: Insufficient documentation

## 2016-01-24 DIAGNOSIS — O26892 Other specified pregnancy related conditions, second trimester: Secondary | ICD-10-CM | POA: Insufficient documentation

## 2016-01-24 DIAGNOSIS — Z880 Allergy status to penicillin: Secondary | ICD-10-CM | POA: Insufficient documentation

## 2016-01-24 DIAGNOSIS — N949 Unspecified condition associated with female genital organs and menstrual cycle: Secondary | ICD-10-CM

## 2016-01-24 DIAGNOSIS — Z3A27 27 weeks gestation of pregnancy: Secondary | ICD-10-CM | POA: Insufficient documentation

## 2016-01-24 NOTE — MAU Note (Signed)
Pain in middle of night THurs and more today. Pelvic pain and pressure. Some back pain lower L side. Two fingers on R hand go numb sometimes as do legs at different times. Denies LOF. Some red/pink on tissue last couple days.

## 2016-01-25 ENCOUNTER — Encounter (HOSPITAL_COMMUNITY): Payer: Self-pay | Admitting: *Deleted

## 2016-01-25 DIAGNOSIS — N949 Unspecified condition associated with female genital organs and menstrual cycle: Secondary | ICD-10-CM

## 2016-01-25 DIAGNOSIS — O26892 Other specified pregnancy related conditions, second trimester: Secondary | ICD-10-CM

## 2016-01-25 LAB — URINALYSIS, ROUTINE W REFLEX MICROSCOPIC
Bilirubin Urine: NEGATIVE
GLUCOSE, UA: NEGATIVE mg/dL
Hgb urine dipstick: NEGATIVE
Ketones, ur: 15 mg/dL — AB
LEUKOCYTES UA: NEGATIVE
NITRITE: NEGATIVE
PROTEIN: NEGATIVE mg/dL
Specific Gravity, Urine: >1.03 — ABNORMAL HIGH (ref 1.005–1.030)
pH: 5.5 (ref 5.0–8.0)

## 2016-01-25 NOTE — Discharge Instructions (Signed)
Round Ligament Pain  The round ligament is a cord of muscle and tissue that helps to support the uterus. It can become a source of pain during pregnancy if it becomes stretched or twisted as the baby grows. The pain usually begins in the second trimester of pregnancy, and it can come and go until the baby is delivered. It is not a serious problem, and it does not cause harm to the baby.  Round ligament pain is usually a short, sharp, and pinching pain, but it can also be a dull, lingering, and aching pain. The pain is felt in the lower side of the abdomen or in the groin. It usually starts deep in the groin and moves up to the outside of the hip area. Pain can occur with:   A sudden change in position.   Rolling over in bed.   Coughing or sneezing.   Physical activity.  HOME CARE INSTRUCTIONS  Watch your condition for any changes. Take these steps to help with your pain:   When the pain starts, relax. Then try:    Sitting down.    Flexing your knees up to your abdomen.    Lying on your side with one pillow under your abdomen and another pillow between your legs.    Sitting in a warm bath for 15-20 minutes or until the pain goes away.   Take over-the-counter and prescription medicines only as told by your health care provider.   Move slowly when you sit and stand.   Avoid long walks if they cause pain.   Stop or lessen your physical activities if they cause pain.  SEEK MEDICAL CARE IF:   Your pain does not go away with treatment.   You feel pain in your back that you did not have before.   Your medicine is not helping.  SEEK IMMEDIATE MEDICAL CARE IF:   You develop a fever or chills.   You develop uterine contractions.   You develop vaginal bleeding.   You develop nausea or vomiting.   You develop diarrhea.   You have pain when you urinate.     This information is not intended to replace advice given to you by your health care provider. Make sure you discuss any questions you have with your health  care provider.     Document Released: 09/15/2008 Document Revised: 02/29/2012 Document Reviewed: 02/13/2015  Elsevier Interactive Patient Education 2016 [employer]

## 2016-01-25 NOTE — MAU Provider Note (Signed)
History     CSN: [id number]  Arrival date and time: 01/24/16 2312   First Provider Initiated Contact with Patient 01/25/16 0115      Chief Complaint  Patient presents with  . Pelvic Pain  . Back Pain   HPI Ms Mariner is a 29yo [id number] @ 27.5wks who presents for eval of pelvic pressure/pain x 2d now, esp when ambulating. Denies leaking; has sm pink d/c at times. Denies dysuria. Denies ctx. She missed her most recent appt on 2/1 due to a mistake w/ appt time. Her preg has been followed by the [facility] and has been remarkable for 1) di/di twin gestation 2) prev C/S x 2 3) GBS bacteruria 4) obesity  OB History    Gravida Para Term Preterm AB TAB SAB Ectopic Multiple Living   0 2   2      Past Medical History  Diagnosis Date  . Pregnancy induced hypertension     Past Surgical History  Procedure Laterality Date  . Cesarean section    . Breast lumpectomy      left  . Biopsy breast    . Cesarean section  06/28/2012    Procedure: CESAREAN SECTION;  Surgeon: Lesly Dukes, MD;  Location: [facility];  Service: Gynecology;  Laterality: N/A;  . Wisdom tooth extraction      age 67    Family History  Problem Relation Age of Onset  . Cancer Mother 83    breast  . Alcohol abuse Mother   . Depression Mother   . Hypertension Mother   . Depression Sister   . Hypertension Sister   . Asthma Brother   . Depression Brother   . Hypertension Brother   . Alcohol abuse Maternal Aunt   . Hypertension Maternal Aunt   . Cancer Maternal Aunt 65    breast ca  . Alcohol abuse Maternal Uncle   . Hypertension Maternal Uncle   . Alzheimer's disease Maternal Grandmother   . Heart disease Maternal Grandmother   . Alcohol abuse Maternal Grandmother   . Hypertension Maternal Grandmother   . Heart disease Maternal Grandfather   . Alcohol abuse Maternal Grandfather   . Hypertension Maternal Grandfather   . Cancer Maternal Grandfather     prostate and bone  . Depression Paternal Grandfather      Social History  Substance Use Topics  . Smoking status: Never Smoker   . Smokeless tobacco: Never Used  . Alcohol Use: No    Allergies:  Allergies  Allergen Reactions  . Penicillins Itching and Swelling    Has patient had a PCN reaction causing immediate rash, facial/tongue/throat swelling, SOB or lightheadedness with hypotension: No Has patient had a PCN reaction causing severe rash involving mucus membranes or skin necrosis: No Has patient had a PCN reaction that required hospitalization No Has patient had a PCN reaction occurring within the last 10 years: No If all of the above answers are "NO", then may proceed with Cephalosporin use.     Prescriptions prior to admission  Medication Sig Dispense Refill Last Dose  . Prenatal Vit-Fe Fumarate-FA (PRENATAL VITAMINS PLUS) 27-1 MG TABS Take 1 tablet by mouth daily. Generic is OK - Prenatal vitamin with 1 mg Folic acid 30 tablet 0 01/24/2016 at Unknown time  . cephALEXin (KEFLEX) 500 MG capsule Take 1 capsule (500 mg total) by mouth 4 (four) times daily. 28 capsule 2 Taking    ROS Physical Exam   Blood pressure  129/60, pulse 88, temperature 97.9 F (36.6 C), resp. rate 18, height  (1.651 m), weight 124.013 kg (273 lb 6.4 oz), last menstrual period 04/08/2015.  Physical Exam  Constitutional: She is oriented to person, place, and time. She appears well-developed.  HENT:  Head: Normocephalic.  Neck: Normal range of motion.  Cardiovascular: Normal rate.   Respiratory: Effort normal.  GI:  EFM A & B: 130s, intermittent tracing, 10x10 accels, approp for GA (very difficult to trace due to habitus) Occ irritability  Genitourinary:  Cx C/L  Musculoskeletal:  Moves LE slowly when bending knees in bed, but + full range of motion  Neurological: She is alert and oriented to person, place, and time.  Skin: Skin is warm and dry.  Psychiatric: She has a normal mood and affect. Her behavior is normal. Thought content normal.     MAU Course  Procedures  MDM NST read Cx exam  Assessment and Plan  Twin IUP@27 .5wks Pelvic pressure  Rec comfort measures including maternity support belt D/C home F/U as scheduled at visit and U/S on 02/05/16  Cam Hai CNM 01/25/2016, 1:20 AM

## 2016-02-05 ENCOUNTER — Ambulatory Visit (INDEPENDENT_AMBULATORY_CARE_PROVIDER_SITE_OTHER): Payer: Self-pay | Admitting: Family

## 2016-02-05 ENCOUNTER — Other Ambulatory Visit (HOSPITAL_COMMUNITY): Payer: Self-pay | Admitting: Maternal and Fetal Medicine

## 2016-02-05 ENCOUNTER — Ambulatory Visit (HOSPITAL_COMMUNITY)
Admission: RE | Admit: 2016-02-05 | Discharge: 2016-02-05 | Disposition: A | Discharge status: Home / Self Care | Payer: Medicaid Other | Source: Ambulatory Visit | Attending: Nurse Practitioner | Admitting: Nurse Practitioner

## 2016-02-05 VITALS — BP 115/71 | HR 98 | Temp 98.2°F | Wt 278.3 lb

## 2016-02-05 DIAGNOSIS — O30043 Twin pregnancy, dichorionic/diamniotic, third trimester: Secondary | ICD-10-CM

## 2016-02-05 DIAGNOSIS — O99213 Obesity complicating pregnancy, third trimester: Secondary | ICD-10-CM | POA: Insufficient documentation

## 2016-02-05 DIAGNOSIS — O0933 Supervision of pregnancy with insufficient antenatal care, third trimester: Secondary | ICD-10-CM | POA: Diagnosis not present

## 2016-02-05 DIAGNOSIS — O34219 Maternal care for unspecified type scar from previous cesarean delivery: Secondary | ICD-10-CM

## 2016-02-05 DIAGNOSIS — Z3A29 29 weeks gestation of pregnancy: Secondary | ICD-10-CM | POA: Insufficient documentation

## 2016-02-05 DIAGNOSIS — O09893 Supervision of other high risk pregnancies, third trimester: Secondary | ICD-10-CM

## 2016-02-05 DIAGNOSIS — O0993 Supervision of high risk pregnancy, unspecified, third trimester: Secondary | ICD-10-CM

## 2016-02-05 DIAGNOSIS — O30049 Twin pregnancy, dichorionic/diamniotic, unspecified trimester: Secondary | ICD-10-CM

## 2016-02-05 LAB — POCT URINALYSIS DIP (DEVICE)
Bilirubin Urine: NEGATIVE
Glucose, UA: NEGATIVE mg/dL
HGB URINE DIPSTICK: NEGATIVE
Ketones, ur: NEGATIVE mg/dL
Leukocytes, UA: NEGATIVE
NITRITE: NEGATIVE
Protein, ur: 30 mg/dL — AB
UROBILINOGEN UA: 0.2 mg/dL (ref 0.0–1.0)
pH: 6 (ref 5.0–8.0)

## 2016-02-05 LAB — CBC
HCT: 33.1 % — ABNORMAL LOW (ref 36.0–46.0)
Hemoglobin: 10.6 g/dL — ABNORMAL LOW (ref 12.0–15.0)
MCH: 27.5 pg (ref 26.0–34.0)
MCHC: 32 g/dL (ref 30.0–36.0)
MCV: 85.8 fL (ref 78.0–100.0)
MPV: 10.3 fL (ref 8.6–12.4)
PLATELETS: 265 10*3/uL (ref 150–400)
RBC: 3.86 MIL/uL — ABNORMAL LOW (ref 3.87–5.11)
RDW: 15.2 % (ref 11.5–15.5)
WBC: 12.8 10*3/uL — AB (ref 4.0–10.5)

## 2016-02-05 NOTE — Addendum Note (Signed)
Addended by: Sherre Lain A on: 02/05/2016 02:54 PM   Modules accepted: Orders

## 2016-02-05 NOTE — Progress Notes (Signed)
Subjective:  Anne Richardson is a 30 y.o. [phone] at [redacted]w[redacted]d being seen today for ongoing prenatal care.  She is currently monitored for the following issues for this high-risk pregnancy and has Previous cesarean delivery x 2, antepartum; Obesity in pregnancy, antepartum; Twin gestation, dichorionic diamniotic; Supervision of high-risk pregnancy; and Group B streptococcal bacteriuria on her problem list.  Patient reports increased pelvic pressure.  Denies vaginal bleeding or leaking of fluid.  Contractions: Not present. Vag. Bleeding: None.  Movement: Present. Denies leaking of fluid.   The following portions of the patient's history were reviewed and updated as appropriate: allergies, current medications, past family history, past medical history, past social history, past surgical history and problem list. Problem list updated.  Objective:   Filed Vitals:   02/05/16 1053  BP: 115/71  Pulse: 98  Temp: 98.2 F (36.8 C)  Weight: 278 lb 4.8 oz (126.236 kg)    Fetal Status: Fetal Heart Rate (bpm): 143/150 Fundal Height: 36 cm Movement: Present     General:  Alert, oriented and cooperative. Patient is in no acute distress.  Skin: Skin is warm and dry. No rash noted.   Cardiovascular: Normal heart rate noted  Respiratory: Normal respiratory effort, no problems with respiration noted  Abdomen: Soft, gravid, appropriate for gestational age. Pain/Pressure: Present     Pelvic: Vag. Bleeding: None     Cervical exam deferred        Extremities: Normal range of motion.  Edema: None  Mental Status: Normal mood and affect. Normal behavior. Normal judgment and thought content.   Urinalysis: Urine Protein: 1+ Urine Glucose: Negative  Assessment and Plan:  Pregnancy: [id number] at [redacted]w[redacted]d  1. Supervision of high-risk pregnancy, third trimester - Growth ultrasound today - Plans to come back this afternoon for 1 hr and tdap (unable to wait at this time)  2. Dichorionic diamniotic twin gestation,  unspecified trimester - Growth ultrasound today  3. Previous cesarean delivery x 2, antepartum - Schedule at 38 weeks  Preterm labor symptoms and general obstetric precautions including but not limited to vaginal bleeding, contractions, leaking of fluid and fetal movement were reviewed in detail with the patient. Please refer to After Visit Summary for other counseling recommendations.  Return in about 2 weeks (around 02/19/2016).   Eino Farber Kennith Gain, CNM

## 2016-02-06 LAB — RPR

## 2016-02-06 LAB — GLUCOSE TOLERANCE, 1 HOUR (50G) W/O FASTING: Glucose, 1 Hour GTT: 93 mg/dL (ref 70–140)

## 2016-02-06 LAB — HIV ANTIBODY (ROUTINE TESTING W REFLEX): HIV 1&2 Ab, 4th Generation: NONREACTIVE

## 2016-02-19 ENCOUNTER — Ambulatory Visit (INDEPENDENT_AMBULATORY_CARE_PROVIDER_SITE_OTHER): Payer: Self-pay | Admitting: Family Medicine

## 2016-02-19 VITALS — Wt 276.8 lb

## 2016-02-19 DIAGNOSIS — O99213 Obesity complicating pregnancy, third trimester: Secondary | ICD-10-CM

## 2016-02-19 DIAGNOSIS — O30043 Twin pregnancy, dichorionic/diamniotic, third trimester: Secondary | ICD-10-CM

## 2016-02-19 DIAGNOSIS — O34219 Maternal care for unspecified type scar from previous cesarean delivery: Secondary | ICD-10-CM

## 2016-02-19 DIAGNOSIS — O99820 Streptococcus B carrier state complicating pregnancy: Secondary | ICD-10-CM

## 2016-02-19 DIAGNOSIS — R8271 Bacteriuria: Secondary | ICD-10-CM

## 2016-02-19 DIAGNOSIS — O0993 Supervision of high risk pregnancy, unspecified, third trimester: Secondary | ICD-10-CM

## 2016-02-19 DIAGNOSIS — R109 Unspecified abdominal pain: Secondary | ICD-10-CM

## 2016-02-19 DIAGNOSIS — O9989 Other specified diseases and conditions complicating pregnancy, childbirth and the puerperium: Secondary | ICD-10-CM

## 2016-02-19 DIAGNOSIS — O26899 Other specified pregnancy related conditions, unspecified trimester: Secondary | ICD-10-CM

## 2016-02-19 DIAGNOSIS — E669 Obesity, unspecified: Secondary | ICD-10-CM

## 2016-02-19 MED ORDER — PRENATAL VITAMINS PLUS 27-1 MG PO TABS: 1.0000 | ORAL_TABLET | Freq: Every day | ORAL | Status: DC

## 2016-02-19 MED ORDER — CYCLOBENZAPRINE HCL 5 MG PO TABS: 5.0000 mg | ORAL_TABLET | Freq: Three times a day (TID) | ORAL | Status: DC | PRN

## 2016-02-19 NOTE — Patient Instructions (Signed)

## 2016-02-19 NOTE — Progress Notes (Signed)
Edema- feet   Pain-LLQ

## 2016-02-19 NOTE — Progress Notes (Signed)
Subjective:  Anne Richardson is a 30 y.o. [phone] at [redacted]w[redacted]d being seen today for ongoing prenatal care.  She is currently monitored for the following issues for this high-risk pregnancy and has Previous cesarean delivery x 2, antepartum; Obesity in pregnancy, antepartum; Twin gestation, dichorionic diamniotic; Supervision of high-risk pregnancy; and Group B streptococcal bacteriuria on her problem list.  Patient reports Abdominal pain, LLQ with radiation to left leg.  Contractions: Not present. Vag. Bleeding: None.  Movement: Present. Denies leaking of fluid.   The following portions of the patient's history were reviewed and updated as appropriate: allergies, current medications, past family history, past medical history, past social history, past surgical history and problem list. Problem list updated.  Objective:   Filed Vitals:   02/19/16 1039  Weight: 276 lb 12.8 oz (125.556 kg)    Fetal Status: Fetal Heart Rate (bpm): 138/128 Fundal Height: 38 cm Movement: Present     General:  Alert, oriented and cooperative. Patient is in no acute distress.  Skin: Skin is warm and dry. No rash noted.   Cardiovascular: Normal heart rate noted  Respiratory: Normal respiratory effort, no problems with respiration noted  Abdomen: Soft, gravid, appropriate for gestational age. Pain/Pressure: Present     Pelvic: Vag. Bleeding: None     Cervical exam deferred        Extremities: Normal range of motion.  Edema: Trace  Mental Status: Normal mood and affect. Normal behavior. Normal judgment and thought content.   Urinalysis:      Assessment and Plan:  Pregnancy: [id number] at [redacted]w[redacted]d  1. Group B streptococcal bacteriuria  2. Previous cesarean delivery x 2, antepartum -repeat CS at 38 weeks  3. Supervision of high-risk pregnancy, third trimester - Prenatal Vit-Fe Fumarate-FA (PRENATAL VITAMINS PLUS) 27-1 MG TABS; Take 1 tablet by mouth daily. Generic is OK - Prenatal vitamin with 1 mg Folic acid   Dispense: 30 tablet; Refill: 11  4. Dichorionic diamniotic twin pregnancy in third trimester - Concordant growth but Twin A with 2VC - Start NST x2 weekly next visit based on cormorbidities - Repeat growth scheduled  5. Abdominal pain affecting pregnancy, antepartum - likely MSK -cyclobenzaprine (FLEXERIL) 5 MG tablet; Take 1 tablet (5 mg total) by mouth 3 (three) times daily as needed for muscle spasms.  Dispense: 30 tablet; Refill: 0  Preterm labor symptoms and general obstetric precautions including but not limited to vaginal bleeding, contractions, leaking of fluid and fetal movement were reviewed in detail with the patient. Please refer to After Visit Summary for other counseling recommendations.   Return in about 1 week (around 02/26/2016) for NST2x weekly, Routine prenatal care.  Future Appointments Date Time Provider Department Center  02/25/2016 11:00 AM [facility] NST [facility]  02/28/2016 11:00 AM [facility] NST [facility]  03/04/2016 3:15 PM [facility] [place] 2 [facility]    Federico Flake, MD

## 2016-02-20 ENCOUNTER — Inpatient Hospital Stay (HOSPITAL_COMMUNITY)
Admission: AD | Admit: 2016-02-20 | Discharge: 2016-02-20 | Disposition: A | Discharge status: Home / Self Care | Payer: Medicaid Other | Source: Ambulatory Visit | Attending: Obstetrics & Gynecology | Admitting: Obstetrics & Gynecology

## 2016-02-20 ENCOUNTER — Encounter (HOSPITAL_COMMUNITY): Payer: Self-pay | Admitting: *Deleted

## 2016-02-20 DIAGNOSIS — M7918 Myalgia, other site: Secondary | ICD-10-CM

## 2016-02-20 DIAGNOSIS — R102 Pelvic and perineal pain: Secondary | ICD-10-CM | POA: Diagnosis present

## 2016-02-20 DIAGNOSIS — O26899 Other specified pregnancy related conditions, unspecified trimester: Secondary | ICD-10-CM

## 2016-02-20 DIAGNOSIS — Z3A31 31 weeks gestation of pregnancy: Secondary | ICD-10-CM | POA: Diagnosis not present

## 2016-02-20 DIAGNOSIS — O30043 Twin pregnancy, dichorionic/diamniotic, third trimester: Secondary | ICD-10-CM

## 2016-02-20 DIAGNOSIS — Z880 Allergy status to penicillin: Secondary | ICD-10-CM | POA: Diagnosis not present

## 2016-02-20 DIAGNOSIS — O26893 Other specified pregnancy related conditions, third trimester: Secondary | ICD-10-CM | POA: Diagnosis not present

## 2016-02-20 DIAGNOSIS — R109 Unspecified abdominal pain: Secondary | ICD-10-CM | POA: Diagnosis not present

## 2016-02-20 DIAGNOSIS — M791 Myalgia: Secondary | ICD-10-CM | POA: Diagnosis not present

## 2016-02-20 DIAGNOSIS — O34219 Maternal care for unspecified type scar from previous cesarean delivery: Secondary | ICD-10-CM | POA: Insufficient documentation

## 2016-02-20 DIAGNOSIS — O99213 Obesity complicating pregnancy, third trimester: Secondary | ICD-10-CM | POA: Insufficient documentation

## 2016-02-20 DIAGNOSIS — O9989 Other specified diseases and conditions complicating pregnancy, childbirth and the puerperium: Secondary | ICD-10-CM

## 2016-02-20 LAB — WET PREP, GENITAL
Clue Cells Wet Prep HPF POC: NONE SEEN
Sperm: NONE SEEN
Trich, Wet Prep: NONE SEEN
YEAST WET PREP: NONE SEEN

## 2016-02-20 LAB — URINALYSIS, ROUTINE W REFLEX MICROSCOPIC
Bilirubin Urine: NEGATIVE
Glucose, UA: NEGATIVE mg/dL
Hgb urine dipstick: NEGATIVE
Ketones, ur: 15 mg/dL — AB
LEUKOCYTES UA: NEGATIVE
NITRITE: NEGATIVE
PH: 6 (ref 5.0–8.0)
Protein, ur: 30 mg/dL — AB

## 2016-02-20 LAB — URINE MICROSCOPIC-ADD ON

## 2016-02-20 LAB — OB RESULTS CONSOLE GC/CHLAMYDIA: Gonorrhea: NEGATIVE

## 2016-02-20 MED ORDER — CYCLOBENZAPRINE HCL 10 MG PO TABS
10.0000 mg | ORAL_TABLET | Freq: Once | ORAL | Status: AC
  Administered 2016-02-20: 10 mg via ORAL
  Filled 2016-02-20: qty 1

## 2016-02-20 MED ORDER — CYCLOBENZAPRINE HCL 5 MG PO TABS: 10.0000 mg | ORAL_TABLET | Freq: Three times a day (TID) | ORAL | Status: DC | PRN

## 2016-02-20 NOTE — Discharge Instructions (Signed)
Braxton Hicks Contractions °Contractions of the uterus can occur throughout pregnancy. Contractions are not always a sign that you are in labor.  °WHAT ARE BRAXTON HICKS CONTRACTIONS?  °Contractions that occur before labor are called Braxton Hicks contractions, or false labor. Toward the end of pregnancy (32-34 weeks), these contractions can develop more often and may become more forceful. This is not true labor because these contractions do not result in opening (dilatation) and thinning of the cervix. They are sometimes difficult to tell apart from true labor because these contractions can be forceful and people have different pain tolerances. You should not feel embarrassed if you go to the hospital with false labor. Sometimes, the only way to tell if you are in true labor is for your health care provider to look for changes in the cervix. °If there are no prenatal problems or other health problems associated with the pregnancy, it is completely safe to be sent home with false labor and await the onset of true labor. °HOW CAN YOU TELL THE DIFFERENCE BETWEEN TRUE AND FALSE LABOR? °False Labor °· The contractions of false labor are usually shorter and not as hard as those of true labor.   °· The contractions are usually irregular.   °· The contractions are often felt in the front of the lower abdomen and in the groin.   °· The contractions may go away when you walk around or change positions while lying down.   °· The contractions get weaker and are shorter lasting as time goes on.   °· The contractions do not usually become progressively stronger, regular, and closer together as with true labor.   °True Labor °· Contractions in true labor last 30-70 seconds, become very regular, usually become more intense, and increase in frequency.   °· The contractions do not go away with walking.   °· The discomfort is usually felt in the top of the uterus and spreads to the lower abdomen and low back.   °· True labor can be  determined by your health care provider with an exam. This will show that the cervix is dilating and getting thinner.   °WHAT TO REMEMBER °· Keep up with your usual exercises and follow other instructions given by your health care provider.   °· Take medicines as directed by your health care provider.   °· Keep your regular prenatal appointments.   °· Eat and drink lightly if you think you are going into labor.   °· If Braxton Hicks contractions are making you uncomfortable:   °¨ Change your position from lying down or resting to walking, or from walking to resting.   °¨ Sit and rest in a tub of warm water.   °¨ Drink 2-3 glasses of water. Dehydration may cause these contractions.   °¨ Do slow and deep breathing several times an hour.   °WHEN SHOULD I SEEK IMMEDIATE MEDICAL CARE? °Seek immediate medical care if: °· Your contractions become stronger, more regular, and closer together.   °· You have fluid leaking or gushing from your vagina.   °· You have a fever.   °· You pass blood-tinged mucus.   °· You have vaginal bleeding.   °· You have continuous abdominal pain.   °· You have low back pain that you never had before.   °· You feel your baby's head pushing down and causing pelvic pressure.   °· Your baby is not moving as much as it used to.   °  °This information is not intended to replace advice given to you by your health care provider. Make sure you discuss any questions you have with your health care   provider.   Document Released: 12/07/2005 Document Revised: 12/12/2013 Document Reviewed: 09/18/2013 Elsevier Interactive Patient Education 2016 [employer]   Musculoskeletal Pain Musculoskeletal pain is muscle and boney aches and pains. These pains can occur in any part of the body. Your caregiver may treat you without knowing the cause of the pain. They may treat you if blood or urine tests, X-rays, and other tests were normal.  CAUSES There is often not a definite cause or reason for these pains.  These pains may be caused by a type of germ (virus). The discomfort may also come from overuse. Overuse includes working out too hard when your body is not fit. Boney aches also come from weather changes. Bone is sensitive to atmospheric pressure changes. HOME CARE INSTRUCTIONS   Ask when your test results will be ready. Make sure you get your test results.  Only take over-the-counter or prescription medicines for pain, discomfort, or fever as directed by your caregiver. If you were given medications for your condition, do not drive, operate machinery or power tools, or sign legal documents for 24 hours. Do not drink alcohol. Do not take sleeping pills or other medications that may interfere with treatment.  Continue all activities unless the activities cause more pain. When the pain lessens, slowly resume normal activities. Gradually increase the intensity and duration of the activities or exercise.  During periods of severe pain, bed rest may be helpful. Lay or sit in any position that is comfortable.  Putting ice on the injured area.  Put ice in a bag.  Place a towel between your skin and the bag.  Leave the ice on for 15 to 20 minutes, 3 to 4 times a day.  Follow up with your caregiver for continued problems and no reason can be found for the pain. If the pain becomes worse or does not go away, it may be necessary to repeat tests or do additional testing. Your caregiver may need to look further for a possible cause. SEEK IMMEDIATE MEDICAL CARE IF:  You have pain that is getting worse and is not relieved by medications.  You develop chest pain that is associated with shortness or breath, sweating, feeling sick to your stomach (nauseous), or throw up (vomit).  Your pain becomes localized to the abdomen.  You develop any new symptoms that seem different or that concern you. MAKE SURE YOU:   Understand these instructions.  Will watch your condition.  Will get help right away if you  are not doing well or get worse.   This information is not intended to replace advice given to you by your health care provider. Make sure you discuss any questions you have with your health care provider.   Document Released: 12/07/2005 Document Revised: 02/29/2012 Document Reviewed: 08/11/2013 Elsevier Interactive Patient Education [employer]

## 2016-02-20 NOTE — MAU Note (Signed)
Pt C/O pelvic & back pain that started around 0400 this morning.  Has vomited twice today.  Denies bleeding or LOF.

## 2016-02-20 NOTE — MAU Provider Note (Signed)
Chief Complaint:  Pelvic Pain   First Provider Initiated Contact with Patient 02/20/16 1717     HPI: Anne Richardson is a 30 y.o. [id number] at [redacted]w[redacted]d who presents to maternity admissions reporting increased pelvic pressure and cramping, bital LBP radiating down left leg and sharp pain running down right side of abd since this morning. Has had pelvic pressure before and was checked for PTL. Told cervix was closed or FT dilated. States she was told pain was muscular and Rx'd Flexeril. First stated she didn't have a ride to pick it up, then stated it didn't work. Upon review of chart she recieved Rx for 5 mg TID.    Location: Pelvic, low, back, right mid abd Quality: See above Severity: 10/10 in pain scale Duration: >24 hours Context: None Timing: constant Modifying factors: worse w/ prolonged standing. Did not try any meds for this episode of pain.  Associated signs and symptoms: Pos for vaginal discharge vomiting x 2. Neg for fever, chills, nausea, diarrhea, constipation, urinary complaints, VB, LOF.    Good fetal movement.   Pregnancy Course: DI/di twins. Has not received BMZ. Morbid obesity. Hx C/S x 2.   Past Medical History: Past Medical History  Diagnosis Date  . Pregnancy induced hypertension     Past obstetric history: OB History  Gravida Para Term Preterm AB SAB TAB Ectopic Multiple Living  0   2    # Outcome Date GA Lbr Len/2nd Weight Sex Delivery Anes PTL Lv  5 Current           4 Term 06/28/12 [redacted]w[redacted]d  9 lb 14.1 oz (4.483 kg) F CS-LTranv Spinal  Y  3 Term 12/13/10   7 lb 13 oz (3.544 kg) F CS-LTranv   Y     Comments: high blood pressure, fever, meconium  2 SAB 12/05/09          1 SAB               Past Surgical History: Past Surgical History  Procedure Laterality Date  . Cesarean section    . Breast lumpectomy      left  . Biopsy breast    . Cesarean section  06/28/2012    Procedure: CESAREAN SECTION;  Surgeon: Lesly Dukes, MD;  Location: [facility];   Service: Gynecology;  Laterality: N/A;  . Wisdom tooth extraction      age 52     Family History: Family History  Problem Relation Age of Onset  . Cancer Mother 66    breast  . Alcohol abuse Mother   . Depression Mother   . Hypertension Mother   . Depression Sister   . Hypertension Sister   . Asthma Brother   . Depression Brother   . Hypertension Brother   . Alcohol abuse Maternal Aunt   . Hypertension Maternal Aunt   . Cancer Maternal Aunt 65    breast ca  . Alcohol abuse Maternal Uncle   . Hypertension Maternal Uncle   . Alzheimer's disease Maternal Grandmother   . Heart disease Maternal Grandmother   . Alcohol abuse Maternal Grandmother   . Hypertension Maternal Grandmother   . Heart disease Maternal Grandfather   . Alcohol abuse Maternal Grandfather   . Hypertension Maternal Grandfather   . Cancer Maternal Grandfather     prostate and bone  . Depression Paternal Grandfather     Social History: Social History  Substance Use Topics  . Smoking status:  Never Smoker   . Smokeless tobacco: Never Used  . Alcohol Use: No    Allergies:  Allergies  Allergen Reactions  . Penicillins Itching and Swelling    Has patient had a PCN reaction causing immediate rash, facial/tongue/throat swelling, SOB or lightheadedness with hypotension: No Has patient had a PCN reaction causing severe rash involving mucus membranes or skin necrosis: No Has patient had a PCN reaction that required hospitalization No Has patient had a PCN reaction occurring within the last 10 years: No If all of the above answers are "NO", then may proceed with Cephalosporin use.     Meds:  Prescriptions prior to admission  Medication Sig Dispense Refill Last Dose  . Prenatal Vit-Fe Fumarate-FA (PRENATAL VITAMINS PLUS) 27-1 MG TABS Take 1 tablet by mouth daily. Generic is OK - Prenatal vitamin with 1 mg Folic acid 30 tablet 11 Past Month at Unknown time  . [DISCONTINUED] cyclobenzaprine (FLEXERIL) 5 MG  tablet Take 1 tablet (5 mg total) by mouth 3 (three) times daily as needed for muscle spasms. (Patient not taking: Reported on 02/20/2016) 30 tablet 0     I have reviewed patient's Past Medical Hx, Surgical Hx, Family Hx, Social Hx, medications and allergies.   ROS:  Review of Systems  Constitutional: Negative for fever and chills.  Gastrointestinal: Positive for vomiting and abdominal pain. Negative for nausea, diarrhea and constipation.  Genitourinary: Positive for vaginal discharge and pelvic pain. Negative for dysuria, urgency, frequency, hematuria, flank pain, vaginal bleeding and vaginal pain.  Musculoskeletal: Positive for back pain. Negative for myalgias.    Physical Exam   Patient Vitals for the past 24 hrs:  BP Temp Temp src Pulse Resp  02/20/16 1554 133/78 mmHg 97.5 F (36.4 C) Oral 86 18   Constitutional: Well-developed, well-nourished, morbidly obese female in mild distress, moves slowly.  Cardiovascular: normal rate Respiratory: normal effort GI: Abd soft, non-tender, gravid, S>D. Pos FM palpated,  MS: Extremities nontender, no edema, normal ROM. LB mildly TTP. Normal ROM Neurologic: Alert and oriented x 4.  GU: Neg CVAT.  Pelvic: NEFG, moderate amount of thin, white, mildly malodorous discharge, no blood, 2 cm left vaginal wall cyst noted that popped during speculum exam. Scant bleeding to follow. Cervix w/ normal ectropion, visually closed. No CMT  Dilation: Closed Effacement (%): Thick Cervical Position: Posterior Exam by:: V.Samanthajo Payano,CNM  FHT:   A: Baseline 140 , moderate variability, accelerations present, no decelerations  B: Baseline 135 , moderate variability, accelerations present, no decelerations Contractions: None. Toco adjusted repeatedly.    Labs: Results for orders placed or performed during the hospital encounter of 02/20/16 (from the past 24 hour(s))  Urinalysis, Routine w reflex microscopic (not at [facility])     Status: Abnormal   Collection Time:  02/20/16  4:35 PM  Result Value Ref Range   Color, Urine YELLOW YELLOW   APPearance CLOUDY (A) CLEAR   Specific Gravity, Urine >1.030 (H) 1.005 - 1.030   pH 6.0 5.0 - 8.0   Glucose, UA NEGATIVE NEGATIVE mg/dL   Hgb urine dipstick NEGATIVE NEGATIVE   Bilirubin Urine NEGATIVE NEGATIVE   Ketones, ur 15 (A) NEGATIVE mg/dL   Protein, ur 30 (A) NEGATIVE mg/dL   Nitrite NEGATIVE NEGATIVE   Leukocytes, UA NEGATIVE NEGATIVE  Urine microscopic-add on     Status: Abnormal   Collection Time: 02/20/16  4:35 PM  Result Value Ref Range   Squamous Epithelial / LPF 6-30 (A) NONE SEEN   WBC, UA 0-5 0 - 5  WBC/hpf   RBC / HPF 0-5 0 - 5 RBC/hpf   Bacteria, UA MANY (A) NONE SEEN   Urine-Other MUCOUS PRESENT   Wet prep, genital     Status: Abnormal   Collection Time: 02/20/16  5:30 PM  Result Value Ref Range   Yeast Wet Prep HPF POC NONE SEEN NONE SEEN   Trich, Wet Prep NONE SEEN NONE SEEN   Clue Cells Wet Prep HPF POC NONE SEEN NONE SEEN   WBC, Wet Prep HPF POC FEW (A) NONE SEEN   Sperm NONE SEEN     Imaging:  NA  MAU Course: UA, NST, Spec exam, Wet prep, GC/Cglemydia cultures, fFN collected discarded due to log closed cervix.   No evidence of PTL. UA shows dehydration. PO fluids and Flexeril 10 mg given.   MDM: - 30 year old morbidly obese female at 31.[redacted] weeks gestation w/ twins - Abd and LBP C/W MS pain from normal pregnancy, twin and morbid obesity. No evidence of PTL.   Assessment: 1. Abdominal pain affecting pregnancy, antepartum   2. Dichorionic diamniotic twin pregnancy in third trimester   3. Previous cesarean delivery, antepartum   4. Musculoskeletal pain     Plan: Discharge home in stable condition.  Preterm labor precautions and fetal kick counts Increase fluids and rest. Increase Flexeril to 10mg  PO QD. Use sparingly.  Work note given for 02/21/16.      Follow-up Information    Follow up with [facility] On 02/25/2016.   Specialty:  Obstetrics and Gynecology    Why:  Routine prenatal visit or sooner as needed if symptoms worsen   Contact information:   [address] [place] [address] [phone]      Follow up with [facility] [place] [facility].   Why:  As needed in emergencies   Contact information:   [address] [id number] mc [place] [address] [phone]        Medication List    TAKE these medications        cyclobenzaprine 5 MG tablet  Commonly known as:  FLEXERIL  Take 2 tablets (10 mg total) by mouth 3 (three) times daily as needed for muscle spasms.     PRENATAL VITAMINS PLUS 27-1 MG Tabs  Take 1 tablet by mouth daily. Generic is OK - Prenatal vitamin with 1 mg Folic acid        [place], CNM 02/20/2016 6:00 PM

## 2016-02-21 LAB — GC/CHLAMYDIA PROBE AMP (~~LOC~~) NOT AT ARMC
CHLAMYDIA, DNA PROBE: NEGATIVE
NEISSERIA GONORRHEA: NEGATIVE

## 2016-02-25 ENCOUNTER — Ambulatory Visit (INDEPENDENT_AMBULATORY_CARE_PROVIDER_SITE_OTHER): Payer: Self-pay | Admitting: Certified Nurse Midwife

## 2016-02-25 VITALS — BP 126/59 | HR 96 | Wt 277.4 lb

## 2016-02-25 DIAGNOSIS — O99213 Obesity complicating pregnancy, third trimester: Secondary | ICD-10-CM

## 2016-02-25 DIAGNOSIS — R8271 Bacteriuria: Secondary | ICD-10-CM

## 2016-02-25 DIAGNOSIS — O34219 Maternal care for unspecified type scar from previous cesarean delivery: Secondary | ICD-10-CM

## 2016-02-25 DIAGNOSIS — O0993 Supervision of high risk pregnancy, unspecified, third trimester: Secondary | ICD-10-CM

## 2016-02-25 DIAGNOSIS — E669 Obesity, unspecified: Secondary | ICD-10-CM

## 2016-02-25 DIAGNOSIS — O30043 Twin pregnancy, dichorionic/diamniotic, third trimester: Secondary | ICD-10-CM

## 2016-02-25 LAB — POCT URINALYSIS DIP (DEVICE)
GLUCOSE, UA: NEGATIVE mg/dL
Hgb urine dipstick: NEGATIVE
KETONES UR: 40 mg/dL — AB
Leukocytes, UA: NEGATIVE
Nitrite: NEGATIVE
PH: 6.5 (ref 5.0–8.0)
Protein, ur: 30 mg/dL — AB
SPECIFIC GRAVITY, URINE: 1.02 (ref 1.005–1.030)
Urobilinogen, UA: 1 mg/dL (ref 0.0–1.0)

## 2016-02-25 NOTE — Progress Notes (Addendum)
Breastfeeding tip of the week reviewed. Bedside US for presentation and FHT's - Twin A on maternal Lt - vtx; Twin B - transverse w/head on maternal Rt. Per consult w/Dr. Shawnie PonsPratt, pt will begin NST's @ 35 weeks because babies are concordant.

## 2016-02-25 NOTE — Patient Instructions (Signed)
Breastfeeding Twins or Multiples Breastfeeding benefits you and your babies. Breastfeeding:  Causes the uterus to contract and return to its original size faster.  Releases hormones that relax you.  Saves money and time. Your milk is available whenever your babies are ready to feed, and it is at the right temperature.  Ensures your babies get the best nutrition. Breast milk is especially beneficial to multiples, who are often small at birth and need all the advantages breast milk can provide.  Creates a unique bond between you and each of your babies. HOW DO I GET STARTED? Nurse as soon as possible after delivery and as often as your babies want to be nursed. This will stimulate your breasts to produce enough milk. Mothers of multiples almost always produce enough milk for all their babies.  If your babies are premature and unable to nurse, you can pump your breasts and freeze the milk until your babies are ready to feed at the breast. To stimulate a milk supply, your breasts need to be emptied at least 8-10 times in a 24-hour period. Ask a lactation specialist to help you choose an effective breast pump and to provide guidance in helping your babies latch on to and feed from the breast when they are ready.  SHOULD I NURSE MY BABIES TOGETHER? Many mothers of multiples find it easiest to nurse two babies at the same time. Nursing babies together may also increase milk-producing hormone (prolactin) levels. The more often the babies nurse effectively, the more milk you will produce. When nursing your babies together:  Ask your nurse or lactation specialist to suggest tips on positioning. There are several positions and holds that make it easier to nurse more than one baby at a time.  Try placing pillows under your arms, legs, and the babies for comfort. Nursing two babies at the same time often gets easier as the babies get older and more experienced at latching on to the breast.  Switch the  babies from one side to the other at alternate feedings. For example, if baby A feeds from the right breast and baby B feeds from the left breast, then at the next feeding baby A should take the left breast and baby B the right breast. This ensures that both breasts get equal amounts of stimulation. It also allows the stronger sucking baby to increase the milk supply for the baby whose suck is weaker. WHAT ARE SOME TIPS TO INCREASE MY SUCCESS?  If you are nursing babies together and one of the babies is having difficulty latching or sucking, try nursing that baby separately so you can give him or her your full attention.  If you are nursing babies separately and one of the babies is having difficulty feeding, it may help to nurse that baby at the same time as his or her sibling. The baby with the stronger or more effective suck will stimulate the mother's milk to flow faster. This will encourage the baby who is having difficulty to suck and swallow correctly.   Try not to give your babies bottles and pacifiers during the early weeks of breastfeeding. Avoiding these things encourages effective sucking patterns and helps establish a good milk supply. You should not need supplemental feedings if you empty your breasts with each feeding.   A good latch for both infants is important in helping the babies empty the breast effectively and for avoiding sore nipples. The most common cause of sore nipples is an improper latch.     Keep track of each baby's stools and wet diapers for the first 6 weeks to make sure each baby is getting enough milk. Each baby should have 6-8 wet diapers and 2 or more bowel movements per day.    This information is not intended to replace advice given to you by your health care provider. Make sure you discuss any questions you have with your health care provider.   Document Released: 04/06/2005 Document Revised: 12/12/2013 Document Reviewed: 09/18/2013 Elsevier Interactive  Patient Education [employer]2016 [employer]

## 2016-02-25 NOTE — Progress Notes (Signed)
Subjective:  Laurine BlazerChiquita J Rupe is a 30 y.o. [phone] at 6441w1d being seen today for ongoing prenatal care.  She is currently monitored for the following issues for this high-risk pregnancy and has Previous cesarean delivery x 2, antepartum; Obesity in pregnancy, antepartum; Twin gestation, dichorionic diamniotic; Supervision of high-risk pregnancy; and Group B streptococcal bacteriuria on her problem list.  Patient reports backache and occasional contractions.  Contractions: Irregular. Vag. Bleeding: None.  Movement: Present. Denies leaking of fluid.   The following portions of the patient's history were reviewed and updated as appropriate: allergies, current medications, past family history, past medical history, past social history, past surgical history and problem list. Problem list updated.  Objective:   Filed Vitals:   02/25/16 1131  BP: 126/59  Pulse: 96  Weight: 277 lb 6.4 oz (125.828 kg)    Fetal Status: Fetal Heart Rate (bpm): 137/132   Movement: Present     General:  Alert, oriented and cooperative. Patient is in no acute distress.  Skin: Skin is warm and dry. No rash noted.   Cardiovascular: Normal heart rate noted  Respiratory: Normal respiratory effort, no problems with respiration noted  Abdomen: Soft, gravid, appropriate for gestational age. Pain/Pressure: Present     Pelvic: Vag. Bleeding: None     Cervical exam deferred        Extremities: Normal range of motion.  Edema: None  Mental Status: Normal mood and affect. Normal behavior. Normal judgment and thought content.   Urinalysis: Urine Protein: 1+ Urine Glucose: Negative  Assessment and Plan:  Pregnancy: [id number] at 10641w1d  1. Previous cesarean delivery x 2, antepartum   2. Supervision of high-risk pregnancy, third trimester   3. Dichorionic diamniotic twin pregnancy in third trimester   4. Group B streptococcal bacteriuria   5. Obesity in pregnancy, antepartum, third trimester   Preterm labor  symptoms and general obstetric precautions including but not limited to vaginal bleeding, contractions, leaking of fluid and fetal movement were reviewed in detail with the patient. Please refer to After Visit Summary for other counseling recommendations.  Return in about 3 days (around 02/28/2016) for as scheduled.   Rhea PinkLori A Rhemi Balbach, CNM

## 2016-02-28 ENCOUNTER — Other Ambulatory Visit: Payer: Self-pay

## 2016-03-04 ENCOUNTER — Encounter (HOSPITAL_COMMUNITY): Payer: Self-pay

## 2016-03-04 ENCOUNTER — Other Ambulatory Visit (HOSPITAL_COMMUNITY): Payer: Self-pay | Admitting: Maternal and Fetal Medicine

## 2016-03-04 ENCOUNTER — Ambulatory Visit (HOSPITAL_COMMUNITY)
Admission: RE | Admit: 2016-03-04 | Discharge: 2016-03-04 | Disposition: A | Discharge status: Home / Self Care | Payer: Medicaid Other | Source: Ambulatory Visit | Attending: Family Medicine | Admitting: Family Medicine

## 2016-03-04 DIAGNOSIS — Z3A33 33 weeks gestation of pregnancy: Secondary | ICD-10-CM | POA: Insufficient documentation

## 2016-03-04 DIAGNOSIS — O0933 Supervision of pregnancy with insufficient antenatal care, third trimester: Secondary | ICD-10-CM | POA: Insufficient documentation

## 2016-03-04 DIAGNOSIS — O09893 Supervision of other high risk pregnancies, third trimester: Secondary | ICD-10-CM

## 2016-03-04 DIAGNOSIS — O30043 Twin pregnancy, dichorionic/diamniotic, third trimester: Secondary | ICD-10-CM

## 2016-03-04 DIAGNOSIS — O34219 Maternal care for unspecified type scar from previous cesarean delivery: Secondary | ICD-10-CM | POA: Insufficient documentation

## 2016-03-04 DIAGNOSIS — O99213 Obesity complicating pregnancy, third trimester: Secondary | ICD-10-CM | POA: Insufficient documentation

## 2016-03-05 ENCOUNTER — Ambulatory Visit (INDEPENDENT_AMBULATORY_CARE_PROVIDER_SITE_OTHER): Payer: Self-pay | Admitting: Obstetrics & Gynecology

## 2016-03-05 VITALS — BP 123/67 | HR 89 | Temp 97.9°F | Wt 277.9 lb

## 2016-03-05 DIAGNOSIS — O30049 Twin pregnancy, dichorionic/diamniotic, unspecified trimester: Secondary | ICD-10-CM

## 2016-03-05 DIAGNOSIS — O26893 Other specified pregnancy related conditions, third trimester: Secondary | ICD-10-CM

## 2016-03-05 DIAGNOSIS — O30043 Twin pregnancy, dichorionic/diamniotic, third trimester: Secondary | ICD-10-CM

## 2016-03-05 DIAGNOSIS — N898 Other specified noninflammatory disorders of vagina: Secondary | ICD-10-CM

## 2016-03-05 LAB — POCT URINALYSIS DIP (DEVICE)
BILIRUBIN URINE: NEGATIVE
GLUCOSE, UA: NEGATIVE mg/dL
Hgb urine dipstick: NEGATIVE
KETONES UR: NEGATIVE mg/dL
Leukocytes, UA: NEGATIVE
Nitrite: NEGATIVE
Protein, ur: 30 mg/dL — AB
Urobilinogen, UA: 0.2 mg/dL (ref 0.0–1.0)
pH: 6 (ref 5.0–8.0)

## 2016-03-05 MED ORDER — FLUCONAZOLE 150 MG PO TABS: 150.0000 mg | ORAL_TABLET | Freq: Once | ORAL | Status: DC

## 2016-03-05 NOTE — Progress Notes (Signed)
US result reviewed  Subjective:  Laurine BlazerChiquita J Kolar is a 30 y.o. [phone] at 7747w3d being seen today for ongoing prenatal care.  She is currently monitored for the following issues for this high-risk pregnancy and has Previous cesarean delivery x 2, antepartum; Obesity in pregnancy, antepartum; Twin gestation, dichorionic diamniotic; Supervision of high-risk pregnancy; and Group B streptococcal bacteriuria on her problem list.  Patient reports fatigue.  Contractions: Not present. Vag. Bleeding: None.  Movement: Present. Denies leaking of fluid.   The following portions of the patient's history were reviewed and updated as appropriate: allergies, current medications, past family history, past medical history, past social history, past surgical history and problem list. Problem list updated.  Objective:   Filed Vitals:   03/05/16 1015  BP: 123/67  Pulse: 89  Temp: 97.9 F (36.6 C)  Weight: 277 lb 14.4 oz (126.055 kg)    Fetal Status: Fetal Heart Rate (bpm): 139/133   Movement: Present     General:  Alert, oriented and cooperative. Patient is in no acute distress.  Skin: Skin is warm and dry. No rash noted.   Cardiovascular: Normal heart rate noted  Respiratory: Normal respiratory effort, no problems with respiration noted  Abdomen: Soft, gravid, appropriate for gestational age. Pain/Pressure: Present     Pelvic: Vag. Bleeding: None Vag D/C Character: White   Cervical exam deferred        Extremities: Normal range of motion.  Edema: None  Mental Status: Normal mood and affect. Normal behavior. Normal judgment and thought content.   Urinalysis:      Assessment and Plan:  Pregnancy: [id number] at 5547w3d  1. Dichorionic diamniotic twin gestation, unspecified trimester Wet prep sent, white d/c suspect yeast - fluconazole (DIFLUCAN) 150 MG tablet; Take 1 tablet (150 mg total) by mouth once.  Dispense: 1 tablet; Refill: 0  Preterm labor symptoms and general obstetric precautions including  but not limited to vaginal bleeding, contractions, leaking of fluid and fetal movement were reviewed in detail with the patient. Please refer to After Visit Summary for other counseling recommendations.  Return in about 1 week (around 03/12/2016).   Adam PhenixJames G Arnold, MD

## 2016-03-05 NOTE — Progress Notes (Signed)
Breastfeeding tip of the week reviewed Pt c/o extreme fatigue

## 2016-03-05 NOTE — Patient Instructions (Signed)
Multiple Pregnancy °Having a multiple pregnancy means you are carrying twins, triplets, or more. The majority of multiple pregnancies are twins. Naturally conceiving triplets or more (higher-order multiples) is quite rare. °Multiple pregnancies can sometimes be riskier than single pregnancies. You are more likely to have certain problems. For example, you are at higher risk for high blood pressure during pregnancy (preeclampsia). You may need to have more frequent appointments for prenatal care.  °CAUSES  °Sometimes your body releases more than one egg at a time. Having more than one egg fertilized by more than one sperm is the most common type of multiple pregnancy. Twins produced this way are fraternal. They are no more alike than other siblings. °Multiple pregnancies also result when one sperm fertilizes one egg and then that egg divides into more than one embryo. These are identical twins or triplets. Identical multiples are always the same gender and look very much alike. °RISK FACTORS °You may be at higher risk for having a multiple pregnancy if: °· You are older than 35. °· You have already had four or more children. °· You have a family history of multiple pregnancy. °· You have had fertility treatment. °SIGNS AND SYMPTOMS °Early signs and symptoms of multiple pregnancy include: °· Rapid weight gain in the first 3 months of pregnancy (first trimester). °· The uterus measuring larger than normal for your stage of pregnancy. °· More severe nausea. °· A higher-than-normal level of human chorionic gonadotropin (HCG). This is a hormone that your body produces in early pregnancy. °DIAGNOSIS  °Your health care provider may suspect a multiple pregnancy from your symptoms and a physical exam. The results of your HCG blood test may also suggest a multiple pregnancy. Your health care provider will confirm that you are carrying multiples by doing an ultrasound exam. This exam involves using sound waves and a computer to  get an image of the inside of your uterus. An ultrasound exam can confirm a multiple pregnancy after you have been pregnant for 6-8 weeks. °TREATMENT  °If you have a multiple pregnancy, you may need: °· Early screening for possible birth defects or genetic abnormalities. °· Frequent pelvic exams to check for signs of early labor. °· Frequent ultrasound exams during the second trimester. °· Frequent checks for high blood pressure and for protein in your urine. These are signs of preeclampsia. °· An anti-inflammatory medicine (corticosteroid) if you go into early labor. This medicine helps your babies' lungs mature. °· Magnesium sulfate to prevent cerebral palsy in your babies if you are expected to deliver before 32 weeks. °· Cesarean delivery if vaginal delivery is too dangerous. °HOME CARE INSTRUCTIONS  °Because your pregnancy may be considered high risk, you have to work closely with your health care team. You may also need to make some lifestyle changes. These may include the following: °· Increase your nutrition. °¨ Gaining about 40-50 lb (18-23 kg) is recommended when you are pregnant with multiples. Try to gain 1 lb (0.5 kg) a week for the first 20 weeks of your pregnancy. °¨ Your health care provider will let you know how many calories you should add to your diet each day. °¨ Eat healthy snacks often throughout the day. This can add calories and reduce nausea. °· Drink enough fluid to keep your urine clear or pale yellow. °· Take your prenatal vitamins. °· Take 1500-2000 mg of calcium daily starting at the 20th week of pregnancy until you deliver your babies. °· By 20-24 weeks, you may need to   limit your activities. °¨ Avoid strenuous activity and work. °¨ Ask your health care provider when you should stop having sexual intercourse. °¨ Rest often. °· Do not smoke. °· Do not drink alcohol. °· Arrange for extra help around the house. °· Keep all your prenatal appointments. °SEEK MEDICAL CARE IF: °· You have  dizziness. °· You have mild pelvic cramps, pelvic pressure, or nagging pain in the abdominal or low back area. °· You have persistent nausea, vomiting, or diarrhea. °· You have a bad smelling vaginal discharge. °· You have pain with urination. °· You are having trouble gaining weight. °· You notice increased swelling in your face, hands, legs, or ankles. °· You have a fever. °SEEK IMMEDIATE MEDICAL CARE IF:  °· You are leaking fluid from your vagina. °· You have spotting or bleeding from your vagina. °· You have severe abdominal cramping or pain. °· You have rapid weight gain or loss. °· You vomit blood or material that looks like coffee grounds. °· You are exposed to German measles and have never had them. °· You are exposed to fifth disease or chickenpox. °· You develop a severe headache. °· You have shortness of breath. °  °This information is not intended to replace advice given to you by your health care provider. Make sure you discuss any questions you have with your health care provider. °  °Document Released: 09/15/2008 Document Revised: 12/28/2014 Document Reviewed: 11/10/2013 °Elsevier Interactive Patient Education ©2016 [employer] ° °

## 2016-03-06 LAB — WET PREP, GENITAL
Trich, Wet Prep: NONE SEEN
YEAST WET PREP: NONE SEEN

## 2016-03-09 ENCOUNTER — Telehealth: Payer: Self-pay

## 2016-03-09 NOTE — Telephone Encounter (Signed)
I attempted to call patient regarding test results. I have left message stating to call the [facility] regarding test results.  Pt has BV, Rx Flagyl 500 mg BID 7 days should be called in

## 2016-03-10 ENCOUNTER — Telehealth: Payer: Self-pay | Admitting: Obstetrics & Gynecology

## 2016-03-10 DIAGNOSIS — N760 Acute vaginitis: Principal | ICD-10-CM

## 2016-03-10 DIAGNOSIS — B9689 Other specified bacterial agents as the cause of diseases classified elsewhere: Secondary | ICD-10-CM

## 2016-03-10 MED ORDER — METRONIDAZOLE 500 MG PO TABS: 500.0000 mg | ORAL_TABLET | Freq: Two times a day (BID) | ORAL | Status: AC

## 2016-03-10 NOTE — Telephone Encounter (Signed)
Pt called back and results given

## 2016-03-10 NOTE — Telephone Encounter (Signed)
Per Dr. Debroah LoopArnold patient has bv, flagyl sent to pharmacy. Called patient and left message that we are calling with results.

## 2016-03-10 NOTE — Telephone Encounter (Signed)
Patient wanting to get results was called on yesterday.

## 2016-03-12 ENCOUNTER — Ambulatory Visit (INDEPENDENT_AMBULATORY_CARE_PROVIDER_SITE_OTHER): Payer: Self-pay | Admitting: Certified Nurse Midwife

## 2016-03-12 VITALS — BP 124/67 | HR 92 | Temp 98.0°F | Wt 282.9 lb

## 2016-03-12 DIAGNOSIS — O0993 Supervision of high risk pregnancy, unspecified, third trimester: Secondary | ICD-10-CM

## 2016-03-12 DIAGNOSIS — O34219 Maternal care for unspecified type scar from previous cesarean delivery: Secondary | ICD-10-CM

## 2016-03-12 DIAGNOSIS — R8271 Bacteriuria: Secondary | ICD-10-CM

## 2016-03-12 DIAGNOSIS — O30043 Twin pregnancy, dichorionic/diamniotic, third trimester: Secondary | ICD-10-CM

## 2016-03-12 LAB — POCT URINALYSIS DIP (DEVICE)
BILIRUBIN URINE: NEGATIVE
Glucose, UA: NEGATIVE mg/dL
HGB URINE DIPSTICK: NEGATIVE
KETONES UR: NEGATIVE mg/dL
LEUKOCYTES UA: NEGATIVE
Nitrite: NEGATIVE
PH: 6 (ref 5.0–8.0)
Protein, ur: NEGATIVE mg/dL
SPECIFIC GRAVITY, URINE: 1.02 (ref 1.005–1.030)
Urobilinogen, UA: 0.2 mg/dL (ref 0.0–1.0)

## 2016-03-12 NOTE — Progress Notes (Signed)
Subjective:  Laurine BlazerChiquita J Copher is a 30 y.o. [phone] at 7345w3d being seen today for ongoing prenatal care.  She is currently monitored for the following issues for this high-risk pregnancy and has Previous cesarean delivery x 2, antepartum; Obesity in pregnancy, antepartum; Twin gestation, dichorionic diamniotic; Supervision of high-risk pregnancy; and Group B streptococcal bacteriuria on her problem list.  Patient reports fatigue.  Contractions: Not present. Vag. Bleeding: None.  Movement: Present. Denies leaking of fluid.   The following portions of the patient's history were reviewed and updated as appropriate: allergies, current medications, past family history, past medical history, past social history, past surgical history and problem list. Problem list updated.  Objective:   Filed Vitals:   03/12/16 1506  BP: 124/67  Pulse: 92  Temp: 98 F (36.7 C)  Weight: 282 lb 14.4 oz (128.323 kg)    Fetal Status: Fetal Heart Rate (bpm): 145/140   Movement: Present     General:  Alert, oriented and cooperative. Patient is in no acute distress.  Skin: Skin is warm and dry. No rash noted.   Cardiovascular: Normal heart rate noted  Respiratory: Normal respiratory effort, no problems with respiration noted  Abdomen: Soft, gravid, appropriate for gestational age. Pain/Pressure: Present     Pelvic: Vag. Bleeding: None Vag D/C Character: White   Cervical exam deferred        Extremities: Normal range of motion.  Edema: Trace  Mental Status: Normal mood and affect. Normal behavior. Normal judgment and thought content.   Urinalysis: Urine Protein: Negative Urine Glucose: Negative  Assessment and Plan:  Pregnancy: [id number] at 2045w3d  1. Previous cesarean delivery, antepartum   2. Dichorionic diamniotic twin pregnancy in third trimester   3. Supervision of high-risk pregnancy, third trimester   4. Group B streptococcal bacteriuria   Preterm labor symptoms and general obstetric  precautions including but not limited to vaginal bleeding, contractions, leaking of fluid and fetal movement were reviewed in detail with the patient. Please refer to After Visit Summary for other counseling recommendations.  Return in about 5 days (around 03/17/2016) for schedule nst. Diane will schedule testing  Rhea PinkLori A Clemmons, CNM

## 2016-03-12 NOTE — Patient Instructions (Signed)

## 2016-03-17 ENCOUNTER — Ambulatory Visit (INDEPENDENT_AMBULATORY_CARE_PROVIDER_SITE_OTHER): Payer: Self-pay | Admitting: General Practice

## 2016-03-17 VITALS — BP 105/62 | HR 88

## 2016-03-17 DIAGNOSIS — O30043 Twin pregnancy, dichorionic/diamniotic, third trimester: Secondary | ICD-10-CM

## 2016-03-17 NOTE — Progress Notes (Signed)
NST reviewed and reactive x 2 

## 2016-03-18 ENCOUNTER — Inpatient Hospital Stay (EMERGENCY_DEPARTMENT_HOSPITAL)
Admission: AD | Admit: 2016-03-18 | Discharge: 2016-03-19 | Disposition: A | Discharge status: Home / Self Care | Payer: Medicaid Other | Source: Ambulatory Visit | Attending: Obstetrics & Gynecology | Admitting: Obstetrics & Gynecology

## 2016-03-18 ENCOUNTER — Encounter (HOSPITAL_COMMUNITY): Payer: Self-pay | Admitting: *Deleted

## 2016-03-18 DIAGNOSIS — E860 Dehydration: Secondary | ICD-10-CM | POA: Insufficient documentation

## 2016-03-18 DIAGNOSIS — Z3A35 35 weeks gestation of pregnancy: Secondary | ICD-10-CM

## 2016-03-18 DIAGNOSIS — R112 Nausea with vomiting, unspecified: Secondary | ICD-10-CM

## 2016-03-18 DIAGNOSIS — O26893 Other specified pregnancy related conditions, third trimester: Secondary | ICD-10-CM | POA: Insufficient documentation

## 2016-03-18 DIAGNOSIS — R1030 Lower abdominal pain, unspecified: Secondary | ICD-10-CM

## 2016-03-18 DIAGNOSIS — K5900 Constipation, unspecified: Secondary | ICD-10-CM | POA: Insufficient documentation

## 2016-03-18 DIAGNOSIS — O212 Late vomiting of pregnancy: Secondary | ICD-10-CM

## 2016-03-18 DIAGNOSIS — O30043 Twin pregnancy, dichorionic/diamniotic, third trimester: Secondary | ICD-10-CM | POA: Insufficient documentation

## 2016-03-18 DIAGNOSIS — O34219 Maternal care for unspecified type scar from previous cesarean delivery: Secondary | ICD-10-CM

## 2016-03-18 DIAGNOSIS — R1012 Left upper quadrant pain: Secondary | ICD-10-CM

## 2016-03-18 LAB — URINE MICROSCOPIC-ADD ON
RBC / HPF: NONE SEEN RBC/hpf (ref 0–5)
WBC, UA: NONE SEEN WBC/hpf (ref 0–5)

## 2016-03-18 LAB — URINALYSIS, ROUTINE W REFLEX MICROSCOPIC
Bilirubin Urine: NEGATIVE
Glucose, UA: NEGATIVE mg/dL
HGB URINE DIPSTICK: NEGATIVE
LEUKOCYTES UA: NEGATIVE
Nitrite: NEGATIVE
PROTEIN: 30 mg/dL — AB
Specific Gravity, Urine: >1.03 — ABNORMAL HIGH (ref 1.005–1.030)
pH: 6 (ref 5.0–8.0)

## 2016-03-18 MED ORDER — POLYETHYLENE GLYCOL 3350 17 G PO PACK
17.0000 g | PACK | Freq: Once | ORAL | Status: AC
  Administered 2016-03-18: 17 g via ORAL
  Filled 2016-03-18: qty 1

## 2016-03-18 MED ORDER — PROMETHAZINE HCL 25 MG PO TABS
12.5000 mg | ORAL_TABLET | Freq: Once | ORAL | Status: AC
  Administered 2016-03-18: 12.5 mg via ORAL
  Filled 2016-03-18: qty 1

## 2016-03-18 MED ORDER — LACTATED RINGERS IV BOLUS (SEPSIS)
1000.0000 mL | Freq: Once | INTRAVENOUS | Status: AC
  Administered 2016-03-18: 1000 mL via INTRAVENOUS

## 2016-03-18 NOTE — MAU Provider Note (Signed)
MAU HISTORY AND PHYSICAL  Chief Complaint:  Nausea; Emesis During Pregnancy; and Abdominal Pain   Anne Richardson is a 30 y.o.  [phone] with IUP of di-di twins at 5025w2d presenting for Nausea; Emesis During Pregnancy; and Abdominal Pain C/o of abdominal pain starting yesterday at 1700.  Pain in lower abdomen and radiating to the LUQ and Lumbar back.  The patient started to have nausea with episodes of vomiting around 1300 today.  She denies symptoms of influenza including fever, aches/chills, sorethroat, and headache.  Also denies dysuria and occuring with food.  The patients last  BM was yesterday; usually Qd.  Patient has tried changing positions and flexeril without relief.   Patient endorses sick contacts- viral URI. Currently being treated for BV with metronidazole.   FWB:  FM+ for both fetus A and B.  Denies vaginal bleeding and fluid leak.  [facility] for high-risk pregnancy and has Previous cesarean delivery x 2, antepartum; Obesity in pregnancy, antepartum; Twin gestation, dichorionic diamniotic; Supervision of high-risk pregnancy; and Group B streptococcal bacteriuria   Past Medical History  Diagnosis Date  . Pregnancy induced hypertension     Past Surgical History  Procedure Laterality Date  . Cesarean section    . Breast lumpectomy      left  . Biopsy breast    . Cesarean section  06/28/2012    Procedure: CESAREAN SECTION;  Surgeon: Lesly DukesKelly H Leggett, MD;  Location: [facility];  Service: Gynecology;  Laterality: N/A;  . Wisdom tooth extraction      age 30    Family History  Problem Relation Age of Onset  . Cancer Mother [phone]    breast  . Alcohol abuse Mother   . Depression Mother   . Hypertension Mother   . Depression Sister   . Hypertension Sister   . Asthma Brother   . Depression Brother   . Hypertension Brother   . Alcohol abuse Maternal Aunt   . Hypertension Maternal Aunt   . Cancer Maternal Aunt 65    breast ca  . Alcohol abuse Maternal  Uncle   . Hypertension Maternal Uncle   . Alzheimer's disease Maternal Grandmother   . Heart disease Maternal Grandmother   . Alcohol abuse Maternal Grandmother   . Hypertension Maternal Grandmother   . Heart disease Maternal Grandfather   . Alcohol abuse Maternal Grandfather   . Hypertension Maternal Grandfather   . Cancer Maternal Grandfather     prostate and bone  . Depression Paternal Grandfather     Social History  Substance Use Topics  . Smoking status: Never Smoker   . Smokeless tobacco: Never Used  . Alcohol Use: No    Allergies  Allergen Reactions  . Penicillins Itching and Swelling    Has patient had a PCN reaction causing immediate rash, facial/tongue/throat swelling, SOB or lightheadedness with hypotension: No Has patient had a PCN reaction causing severe rash involving mucus membranes or skin necrosis: No Has patient had a PCN reaction that required hospitalization No Has patient had a PCN reaction occurring within the last 10 years: No If all of the above answers are "NO", then may proceed with Cephalosporin use.     Prescriptions prior to admission  Medication Sig Dispense Refill Last Dose  . cyclobenzaprine (FLEXERIL) 5 MG tablet Take 2 tablets (10 mg total) by mouth 3 (three) times daily as needed for muscle spasms. 30 tablet 0 03/18/2016 at Unknown time  . metroNIDAZOLE (FLAGYL) 500 MG  tablet Take 1 tablet (500 mg total) by mouth 2 (two) times daily. 14 tablet 0 03/18/2016 at Unknown time  . Prenatal Vit-Fe Fumarate-FA (PRENATAL VITAMINS PLUS) 27-1 MG TABS Take 1 tablet by mouth daily. Generic is OK - Prenatal vitamin with 1 mg Folic acid 30 tablet 11 Past Week at Unknown time  . fluconazole (DIFLUCAN) 150 MG tablet Take 1 tablet (150 mg total) by mouth once. (Patient not taking: Reported on 03/18/2016) 1 tablet 0     Review of Systems - Negative except for what is mentioned in HPI.  Physical Exam  Blood pressure 121/67, pulse 81, temperature 98.9 F (37.2  C), temperature source Oral, resp. rate 18, last menstrual period 04/08/2015. GENERAL: Well-developed, well-nourished female in no acute distress.  LUNGS: Clear to auscultation bilaterally.  HEART: Regular rate and rhythm. ABDOMEN: Soft, nontender, nondistended, gravid x2.  EXTREMITIES: Nontender, no edema, 2+ distal pulses. Cervical Exam: FT/thick  Presentation: Unable to tell FHT:  Category 1 tracing x 2 Contractions: none   Labs: No results found for this or any previous visit (from the past 24 hour(s)).  Imaging Studies:  [place] Mfm Ob Follow Up  03/04/2016  OBSTETRICAL ULTRASOUND: This exam was performed within a [facility]. The OB US report was generated in the AS system, and faxed to the ordering physician.  This report is available in the [employer]. See the AS Obstetric US report via the Image Link.  [place] Mfm Ob Follow Up Addl Gest  03/04/2016  OBSTETRICAL ULTRASOUND: This exam was performed within a [facility] [facility]. The OB US report was generated in the AS system, and faxed to the ordering physician.  This report is available in the [employer]. See the AS Obstetric US report via the Image Link.   While in MAU: UA-ketones and elevated spec grav.  1 L bolus PO fluid challenge  Assessment: Anne Richardson is  29 y.o. [phone] at [redacted]w[redacted]d presents with mild dehydration and  Nausea; Emesis During Pregnancy; and Abdominal Pain 2/2 nausea/vomiting of pregnancy and constipation.  Plan: -While in the hospital the patient was given 1L bolus of LR for dehydration and phenergan 12.5mg  for nausea.  Prior to discharge patient passed PO challenge. -For constipation, the patient was given miralax.  She was discharged with miralax and instructions for home cleanout. -Patient currently IUP with Di-di twins.  FWB was category 1 tracing for fetus A and fetus B.   -Continue routine prenatal care -Follow-up at next prenatal visit.   Nolon Bussing  [place] 3/29/20179:30 PM  CNM attestation:  I have seen and examined this patient; I agree with above documentation in the resident's note.   Yuka Lallier Beegle is a 30 y.o. [phone] reporting left-sided abd pain +FM, denies LOF, VB, contractions, vaginal discharge.  PE: BP 90/42 mmHg  Pulse 84  Temp(Src) 98.9 F (37.2 C) (Oral)  Resp 18  LMP 04/08/2015 Gen: calm comfortable, NAD Resp: normal effort, no distress Abd: gravid  ROS, labs, PMH reviewed NST reactive   Plan: - fetal kick counts reinforced,  labor precautions rev'd - take Miralax and stool softener daily - continue routine follow up in OB clinic  Cam Hai, CNM 1:06 AM  03/19/2016

## 2016-03-18 NOTE — MAU Note (Signed)
Pt states began having lower pelvic pain (pressure 10/10) at 1700 yesterday.  Pt states did not try any medicine to relieve pain.  Pt states began vomiting around 1300 and unable to tolerate fluids or food.  Pt states pelvic pain now on left side of abdomen and radiates to her back (cramping 10/10).  Denies vaginal bleeding/discharge.

## 2016-03-19 DIAGNOSIS — R1012 Left upper quadrant pain: Secondary | ICD-10-CM

## 2016-03-19 DIAGNOSIS — O9989 Other specified diseases and conditions complicating pregnancy, childbirth and the puerperium: Secondary | ICD-10-CM

## 2016-03-19 DIAGNOSIS — Z3A35 35 weeks gestation of pregnancy: Secondary | ICD-10-CM

## 2016-03-19 MED ORDER — POLYETHYLENE GLYCOL 3350 17 G PO PACK: 17.0000 g | PACK | Freq: Every day | ORAL | Status: DC

## 2016-03-20 ENCOUNTER — Inpatient Hospital Stay (HOSPITAL_COMMUNITY): Payer: Medicaid Other

## 2016-03-20 ENCOUNTER — Other Ambulatory Visit: Payer: Self-pay

## 2016-03-20 ENCOUNTER — Inpatient Hospital Stay (HOSPITAL_COMMUNITY)
Admission: AD | Admit: 2016-03-20 | Discharge: 2016-03-24 | DRG: 765 | Disposition: A | Discharge status: Home / Self Care | Payer: Medicaid Other | Source: Ambulatory Visit | Attending: Obstetrics & Gynecology | Admitting: Obstetrics & Gynecology

## 2016-03-20 ENCOUNTER — Encounter (HOSPITAL_COMMUNITY): Payer: Self-pay

## 2016-03-20 DIAGNOSIS — Z803 Family history of malignant neoplasm of breast: Secondary | ICD-10-CM | POA: Diagnosis not present

## 2016-03-20 DIAGNOSIS — O34211 Maternal care for low transverse scar from previous cesarean delivery: Secondary | ICD-10-CM | POA: Diagnosis present

## 2016-03-20 DIAGNOSIS — D72829 Elevated white blood cell count, unspecified: Secondary | ICD-10-CM | POA: Diagnosis present

## 2016-03-20 DIAGNOSIS — O99214 Obesity complicating childbirth: Secondary | ICD-10-CM | POA: Diagnosis present

## 2016-03-20 DIAGNOSIS — Z3A35 35 weeks gestation of pregnancy: Secondary | ICD-10-CM | POA: Diagnosis not present

## 2016-03-20 DIAGNOSIS — Z820 Family history of epilepsy and other diseases of the nervous system: Secondary | ICD-10-CM

## 2016-03-20 DIAGNOSIS — IMO0002 Reserved for concepts with insufficient information to code with codable children: Secondary | ICD-10-CM

## 2016-03-20 DIAGNOSIS — O9912 Other diseases of the blood and blood-forming organs and certain disorders involving the immune mechanism complicating childbirth: Secondary | ICD-10-CM | POA: Diagnosis present

## 2016-03-20 DIAGNOSIS — R102 Pelvic and perineal pain: Secondary | ICD-10-CM | POA: Diagnosis present

## 2016-03-20 DIAGNOSIS — O9962 Diseases of the digestive system complicating childbirth: Secondary | ICD-10-CM | POA: Diagnosis present

## 2016-03-20 DIAGNOSIS — Z880 Allergy status to penicillin: Secondary | ICD-10-CM | POA: Diagnosis not present

## 2016-03-20 DIAGNOSIS — Z98891 History of uterine scar from previous surgery: Secondary | ICD-10-CM

## 2016-03-20 DIAGNOSIS — Z818 Family history of other mental and behavioral disorders: Secondary | ICD-10-CM | POA: Diagnosis not present

## 2016-03-20 DIAGNOSIS — Z811 Family history of alcohol abuse and dependence: Secondary | ICD-10-CM

## 2016-03-20 DIAGNOSIS — O359XX0 Maternal care for (suspected) fetal abnormality and damage, unspecified, not applicable or unspecified: Secondary | ICD-10-CM | POA: Diagnosis present

## 2016-03-20 DIAGNOSIS — O321XX2 Maternal care for breech presentation, fetus 2: Secondary | ICD-10-CM | POA: Diagnosis present

## 2016-03-20 DIAGNOSIS — O358XX0 Maternal care for other (suspected) fetal abnormality and damage, not applicable or unspecified: Principal | ICD-10-CM | POA: Diagnosis present

## 2016-03-20 DIAGNOSIS — Z8249 Family history of ischemic heart disease and other diseases of the circulatory system: Secondary | ICD-10-CM

## 2016-03-20 DIAGNOSIS — Z825 Family history of asthma and other chronic lower respiratory diseases: Secondary | ICD-10-CM | POA: Diagnosis not present

## 2016-03-20 DIAGNOSIS — O34219 Maternal care for unspecified type scar from previous cesarean delivery: Secondary | ICD-10-CM

## 2016-03-20 DIAGNOSIS — K219 Gastro-esophageal reflux disease without esophagitis: Secondary | ICD-10-CM | POA: Diagnosis present

## 2016-03-20 DIAGNOSIS — Z6841 Body Mass Index (BMI) 40.0 and over, adult: Secondary | ICD-10-CM | POA: Diagnosis not present

## 2016-03-20 DIAGNOSIS — Z9289 Personal history of other medical treatment: Secondary | ICD-10-CM

## 2016-03-20 DIAGNOSIS — O99824 Streptococcus B carrier state complicating childbirth: Secondary | ICD-10-CM | POA: Diagnosis present

## 2016-03-20 DIAGNOSIS — O288 Other abnormal findings on antenatal screening of mother: Secondary | ICD-10-CM

## 2016-03-20 DIAGNOSIS — O30043 Twin pregnancy, dichorionic/diamniotic, third trimester: Secondary | ICD-10-CM

## 2016-03-20 DIAGNOSIS — R8271 Bacteriuria: Secondary | ICD-10-CM

## 2016-03-20 DIAGNOSIS — O99213 Obesity complicating pregnancy, third trimester: Secondary | ICD-10-CM

## 2016-03-20 DIAGNOSIS — O0993 Supervision of high risk pregnancy, unspecified, third trimester: Secondary | ICD-10-CM

## 2016-03-20 LAB — TYPE AND SCREEN
ABO/RH(D): B POS
Antibody Screen: NEGATIVE

## 2016-03-20 MED ORDER — OXYCODONE-ACETAMINOPHEN 5-325 MG PO TABS
2.0000 | ORAL_TABLET | Freq: Once | ORAL | Status: AC
  Administered 2016-03-20: 2 via ORAL
  Filled 2016-03-20: qty 2

## 2016-03-20 MED ORDER — CALCIUM CARBONATE ANTACID 500 MG PO CHEW
2.0000 | CHEWABLE_TABLET | ORAL | Status: DC | PRN
  Administered 2016-03-20 – 2016-03-21 (×2): 400 mg via ORAL
  Filled 2016-03-20 (×3): qty 2

## 2016-03-20 MED ORDER — OXYCODONE-ACETAMINOPHEN 5-325 MG PO TABS
1.0000 | ORAL_TABLET | Freq: Four times a day (QID) | ORAL | Status: DC | PRN
  Administered 2016-03-20 (×2): 1 via ORAL
  Filled 2016-03-20 (×2): qty 1

## 2016-03-20 MED ORDER — BETAMETHASONE SOD PHOS & ACET 6 (3-3) MG/ML IJ SUSP
12.0000 mg | Freq: Once | INTRAMUSCULAR | Status: AC
  Administered 2016-03-20: 12 mg via INTRAMUSCULAR
  Filled 2016-03-20: qty 2

## 2016-03-20 MED ORDER — PRENATAL MULTIVITAMIN CH
1.0000 | ORAL_TABLET | Freq: Every day | ORAL | Status: DC
  Administered 2016-03-20: 1 via ORAL
  Filled 2016-03-20 (×2): qty 1

## 2016-03-20 MED ORDER — METRONIDAZOLE 500 MG PO TABS
500.0000 mg | ORAL_TABLET | Freq: Two times a day (BID) | ORAL | Status: DC
  Administered 2016-03-20 (×2): 500 mg via ORAL
  Filled 2016-03-20 (×5): qty 1

## 2016-03-20 MED ORDER — BETAMETHASONE SOD PHOS & ACET 6 (3-3) MG/ML IJ SUSP
12.0000 mg | Freq: Once | INTRAMUSCULAR | Status: DC
  Filled 2016-03-20: qty 2

## 2016-03-20 MED ORDER — NIFEDIPINE 10 MG PO CAPS
20.0000 mg | ORAL_CAPSULE | Freq: Once | ORAL | Status: AC
  Administered 2016-03-20: 20 mg via ORAL
  Filled 2016-03-20: qty 2

## 2016-03-20 MED ORDER — ACETAMINOPHEN 325 MG PO TABS: 650.0000 mg | ORAL_TABLET | ORAL | Status: DC | PRN

## 2016-03-20 MED ORDER — DOCUSATE SODIUM 100 MG PO CAPS
100.0000 mg | ORAL_CAPSULE | Freq: Every day | ORAL | Status: DC
  Administered 2016-03-20: 100 mg via ORAL
  Filled 2016-03-20 (×2): qty 1

## 2016-03-20 MED ORDER — ZOLPIDEM TARTRATE 5 MG PO TABS: 5.0000 mg | ORAL_TABLET | Freq: Every evening | ORAL | Status: DC | PRN

## 2016-03-20 NOTE — MAU Note (Signed)
Off monitor to bathroom.

## 2016-03-20 NOTE — MAU Note (Signed)
RN to bedside to reapply EFM. Pt refusing monitoring, saying "I can't get in a comfortable position. I can't do this anymore. I just want these babies out." Cathie BeamsFran Cresenzo-Dishmon CNM called back to report patient refusing monitoring.

## 2016-03-20 NOTE — MAU Note (Signed)
Patient unable to sit still for monitoring, very restless, uncomfortable.

## 2016-03-20 NOTE — H&P (Signed)
ANTEPARTUM ADMISSION HISTORY AND PHYSICAL NOTE   History of Present Illness: Anne Richardson is a 30 y.o. [phone] at [redacted]w[redacted]d who presents with persistent left lower pelvic pain radiating to groin/back. Seen for this previously, told constipation or MSK. No dysuria or hematuria, no fevers or chills, tolerating diet, no n/v. No LOF or vaginal bleeding. Positive fetal movement.   Patient Active Problem List   Diagnosis Date Noted  . Fetal abnormality in antepartum pregnancy 03/20/2016  . Group B streptococcal bacteriuria 12/01/2015  . Obesity in pregnancy, antepartum 11/27/2015  . Twin gestation, dichorionic diamniotic 11/27/2015  . Supervision of high-risk pregnancy 11/27/2015  . Previous cesarean delivery x 2, antepartum 01/18/2012    Past Medical History  Diagnosis Date  . Pregnancy induced hypertension     Past Surgical History  Procedure Laterality Date  . Cesarean section    . Breast lumpectomy      left  . Biopsy breast    . Cesarean section  06/28/2012    Procedure: CESAREAN SECTION;  Surgeon: Lesly Dukes, MD;  Location: [facility];  Service: Gynecology;  Laterality: N/A;  . Wisdom tooth extraction      age 15    OB History  Gravida Para Term Preterm AB SAB TAB Ectopic Multiple Living  0   2    # Outcome Date GA Lbr Len/2nd Weight Sex Delivery Anes PTL Lv  5 Current           4 Term 06/28/12 [redacted]w[redacted]d  9 lb 14.1 oz (4.483 kg) F CS-LTranv Spinal  Y  3 Term 12/13/10   7 lb 13 oz (3.544 kg) F CS-LTranv   Y     Comments: high blood pressure, fever, meconium  2 SAB 12/05/09          1 SAB               Social History   Social History  . Marital Status: Single    Spouse Name: N/A  . Number of Children: N/A  . Years of Education: N/A   Social History Main Topics  . Smoking status: Never Smoker   . Smokeless tobacco: Never Used  . Alcohol Use: No  . Drug Use: No     Comment: last use Apr 23, 2015.  Marland Kitchen Sexual Activity: Yes   Other Topics Concern  .  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 Cancer Mother 38    breast  . Alcohol abuse Mother   . Depression Mother   . Hypertension Mother   . Depression Sister   . Hypertension Sister   . Asthma Brother   . Depression Brother   . Hypertension Brother   . Alcohol abuse Maternal Aunt   . Hypertension Maternal Aunt   . Cancer Maternal Aunt 65    breast ca  . Alcohol abuse Maternal Uncle   . Hypertension Maternal Uncle   . Alzheimer's disease Maternal Grandmother   . Heart disease Maternal Grandmother   . Alcohol abuse Maternal Grandmother   . Hypertension Maternal Grandmother   . Heart disease Maternal Grandfather   . Alcohol abuse Maternal Grandfather   . Hypertension Maternal Grandfather   . Cancer Maternal Grandfather     prostate and bone  .  Depression Paternal Grandfather     Allergies  Allergen Reactions  . Penicillins Itching and Swelling    Has patient had a PCN reaction causing immediate rash, facial/tongue/throat swelling, SOB or lightheadedness with hypotension: No Has patient had a PCN reaction causing severe rash involving mucus membranes or skin necrosis: No Has patient had a PCN reaction that required hospitalization No Has patient had a PCN reaction occurring within the last 10 years: No If all of the above answers are "NO", then may proceed with Cephalosporin use.     Prescriptions prior to admission  Medication Sig Dispense Refill Last Dose  . cyclobenzaprine (FLEXERIL) 5 MG tablet Take 2 tablets (10 mg total) by mouth 3 (three) times daily as needed for muscle spasms. 30 tablet 0 03/18/2016 at Unknown time  . fluconazole (DIFLUCAN) 150 MG tablet Take 1 tablet (150 mg total) by mouth once. (Patient not taking: Reported on 03/18/2016) 1 tablet 0   . metroNIDAZOLE (FLAGYL) 500  MG tablet Take 1 tablet (500 mg total) by mouth 2 (two) times daily. 14 tablet 0 03/18/2016 at Unknown time  . polyethylene glycol (MIRALAX) packet Take 17 g by mouth daily. 14 each 0   . Prenatal Vit-Fe Fumarate-FA (PRENATAL VITAMINS PLUS) 27-1 MG TABS Take 1 tablet by mouth daily. Generic is OK - Prenatal vitamin with 1 mg Folic acid 30 tablet 11 Past Week at Unknown time    Review of Systems - Negative except as per hpi  Vitals:  BP 108/90 mmHg  Pulse 94  Temp(Src) 98.3 F (36.8 C) (Oral)  Resp 20  Ht  (1.626 m)  Wt 282 lb (127.914 kg)  BMI 48.38 kg/m2  SpO2 97%  LMP 04/08/2015 Physical Examination: CONSTITUTIONAL: Well-developed, well-nourished female in no acute distress.  HENT:  Normocephalic, atraumatic, External right and left ear normal. Oropharynx is clear and moist EYES: Conjunctivae and EOM are normal. Pupils are equal, round, and reactive to light. No scleral icterus.  NECK: Normal range of motion, supple, no masses SKIN: Skin is warm and dry. No rash noted. Not diaphoretic. No erythema. No pallor. NEUROLGIC: Alert and oriented to person, place, and time. Normal reflexes, muscle tone coordination. No cranial nerve deficit noted. PSYCHIATRIC: Normal mood and affect. Normal behavior. Normal judgment and thought content. CARDIOVASCULAR: Normal heart rate noted, regular rhythm RESPIRATORY: Effort and breath sounds normal, no problems with respiration noted ABDOMEN: Soft, nontender, nondistended, gravid. MUSCULOSKELETAL: Normal range of motion. No edema and no tenderness. 2+ distal pulses.  Cervix: 0.5/thick/high rn exam Membranes:intact Fetal Monitoring: 140/mod/+a/-d; 145/mod/-a/-d Tocometer: irritability  Labs:  No results found for this or any previous visit (from the past 24 hour(s)).  Imaging Studies: [place] Mfm Ob Follow Up  03/04/2016  OBSTETRICAL ULTRASOUND: This exam was performed within a [facility]. The OB US report was generated in  the AS system, and faxed to the ordering physician.  This report is available in the [employer]. See the AS Obstetric US report via the Image Link.  [place] Mfm Ob Follow Up Addl Gest  03/04/2016  OBSTETRICAL ULTRASOUND: This exam was performed within a [facility]. The OB US report was generated in the AS system, and faxed to the ordering physician.  This report is available in the [employer]. See the AS Obstetric US report via the Image Link.    Assessment and Plan: Patient Active Problem List   Diagnosis Date Noted  . Fetal abnormality in antepartum pregnancy 03/20/2016  . Group  B streptococcal bacteriuria 12/01/2015  . Obesity in pregnancy, antepartum 11/27/2015  . Twin gestation, dichorionic diamniotic 11/27/2015  . Supervision of high-risk pregnancy 11/27/2015  . Previous cesarean delivery x 2, antepartum 01/18/2012   # 6/10 BPP fetus B - non-reactive NST (but no decels and normal variability). Initial BPP 6/10 for breathing and NST, and again 6/10 6 hours later. Twin A bpp 10/10 x2. Discussed w/ MFM who agrees w/ following plan - admit, monitor continuously, BPP repeat tomorrow morning - BMZ now and again in 24 hours  # Round ligament pain - oxycodone prn   Silvano BilisNoah B Trayquan Kolakowski, MD OB Fellow Faculty Practice, [facility] [place]

## 2016-03-20 NOTE — MAU Note (Signed)
Pt agreed to be placed on monitor at this time; EFM applied.

## 2016-03-20 NOTE — Progress Notes (Signed)
Orders to replace EFM and TOCO

## 2016-03-20 NOTE — Progress Notes (Signed)
Orders to check cervix

## 2016-03-20 NOTE — MAU Note (Signed)
Called Drenda FreezeFran CNM inquiring about plan for patient, per CNM need fhr to be reactive for both prior to discharging patient.

## 2016-03-20 NOTE — MAU Note (Signed)
Cardio readjusted held in place, patient needing to sit up, cannot lay back for monitoring.

## 2016-03-20 NOTE — MAU Note (Signed)
Pt here with c/o abdominal pain, more intense tonight than last night. Pains are coming and going. Twins pregnancy. Patient reports decreased fetal movement with one baby.

## 2016-03-20 NOTE — MAU Note (Signed)
Notified Dr. Jonathon JordanGambino patient requesting to see a provider wants to know what is going on and also requesting something for pain.

## 2016-03-20 NOTE — MAU Provider Note (Signed)
None     Chief Complaint:  Abdominal Pain   Anne BlazerChiquita J Richardson is  30 y.o. [id number] at 6720w4d presents complaining of Abdominal Pain .  She states irregular, every 2-5 minutes contractions are associated with none vaginal bleeding, intact membranes, along with active fetal movement. She also c/o left sided groin pain that radiates towards her back, constant and exacerbated by her movement and the babies movement. She was here on 3/29 with same complaints. She states "i can't take it anymore, I want these babies out".  She has had 2 C S and plans a Repeat CS,   Obstetrical/Gynecological History: OB History    Gravida Para Term Preterm AB TAB SAB Ectopic Multiple Living   5 2 2  2  0 2   2     Past Medical History: Past Medical History  Diagnosis Date  . Pregnancy induced hypertension     Past Surgical History: Past Surgical History  Procedure Laterality Date  . Cesarean section    . Breast lumpectomy      left  . Biopsy breast    . Cesarean section  06/28/2012    Procedure: CESAREAN SECTION;  Surgeon: Lesly DukesKelly H Leggett, MD;  Location: [facility];  Service: Gynecology;  Laterality: N/A;  . Wisdom tooth extraction      age 30    Family History: Family History  Problem Relation Age of Onset  . Cancer Mother 3235    breast  . Alcohol abuse Mother   . Depression Mother   . Hypertension Mother   . Depression Sister   . Hypertension Sister   . Asthma Brother   . Depression Brother   . Hypertension Brother   . Alcohol abuse Maternal Aunt   . Hypertension Maternal Aunt   . Cancer Maternal Aunt 65    breast ca  . Alcohol abuse Maternal Uncle   . Hypertension Maternal Uncle   . Alzheimer's disease Maternal Grandmother   . Heart disease Maternal Grandmother   . Alcohol abuse Maternal Grandmother   . Hypertension Maternal Grandmother   . Heart disease Maternal Grandfather   . Alcohol abuse Maternal Grandfather   . Hypertension Maternal Grandfather   . Cancer Maternal Grandfather      prostate and bone  . Depression Paternal Grandfather     Social History: Social History  Substance Use Topics  . Smoking status: Never Smoker   . Smokeless tobacco: Never Used  . Alcohol Use: No    Allergies:  Allergies  Allergen Reactions  . Penicillins Itching and Swelling    Has patient had a PCN reaction causing immediate rash, facial/tongue/throat swelling, SOB or lightheadedness with hypotension: No Has patient had a PCN reaction causing severe rash involving mucus membranes or skin necrosis: No Has patient had a PCN reaction that required hospitalization No Has patient had a PCN reaction occurring within the last 10 years: No If all of the above answers are "NO", then may proceed with Cephalosporin use.     Meds:  Prescriptions prior to admission  Medication Sig Dispense Refill Last Dose  . cyclobenzaprine (FLEXERIL) 5 MG tablet Take 2 tablets (10 mg total) by mouth 3 (three) times daily as needed for muscle spasms. 30 tablet 0 03/18/2016 at Unknown time  . fluconazole (DIFLUCAN) 150 MG tablet Take 1 tablet (150 mg total) by mouth once. (Patient not taking: Reported on 03/18/2016) 1 tablet 0   . metroNIDAZOLE (FLAGYL) 500 MG tablet Take 1 tablet (500 mg total) by mouth  2 (two) times daily. 14 tablet 0 03/18/2016 at Unknown time  . polyethylene glycol (MIRALAX) packet Take 17 g by mouth daily. 14 each 0   . Prenatal Vit-Fe Fumarate-FA (PRENATAL VITAMINS PLUS) 27-1 MG TABS Take 1 tablet by mouth daily. Generic is OK - Prenatal vitamin with 1 mg Folic acid 30 tablet 11 Past Week at Unknown time    Review of Systems   Constitutional: Negative for fever and chills Eyes: Negative for visual disturbances Respiratory: Negative for shortness of breath, dyspnea Cardiovascular: Negative for chest pain or palpitations  Gastrointestinal: Negative for vomiting, diarrhea and constipation.  Had a  BM yesterday Genitourinary: Negative for dysuria and urgency Musculoskeletal:  exaggerated difficulty w/ ROM  Neurological: Negative for dizziness and headaches    Physical Exam  Blood pressure 108/90, pulse 94, temperature 98.3 F (36.8 C), temperature source Oral, resp. rate 20, height  (1.626 m), weight 127.914 kg (282 lb), last menstrual period 04/08/2015, SpO2 97 %. GENERAL: Well-developed, well-nourished female in no acute distress.  LUNGS: Clear to auscultation bilaterally.  HEART: Regular rate and rhythm. ABDOMEN: Soft, nontender, nondistended, gravid.  EXTREMITIES: Nontender, no edema, 2+ distal pulses. DTR's 2+ CERVICAL EXAM: Dilatation FTcm   Effacement 0%   Station -2, posterior.  Rechecked after 1.5 hours, no change   FHT:  Baseline rate 150 bpm   Variability moderate  Accelerations absent   Decelerations none Contractions: Every 2-5 mins   Labs: No results found for this or any previous visit (from the past 24 hour(s)). Imaging Studies:    Assessment: Anne Richardson is  30 y.o. [id number] at [redacted]w[redacted]d presents with MSK pain, preterm contractions w/o cervical change. Pt was given  percocet, states pain is still 10/10, did not help at all.  Given  procardia for comfort.  D/T NR NST, a BPP was done.  Baby B 8/10, Baby A 6/10 (no breathing).  Plan: Discussed with Dr. Macon Large.  Recommended prolonged EFM in MAU. Does not plan to deliver.   Care turned over to oncoming CNM.   [name],[name] 3/31/20173:52 AM

## 2016-03-21 ENCOUNTER — Encounter (HOSPITAL_COMMUNITY)
Admission: AD | Disposition: A | Discharge status: Home / Self Care | Payer: Self-pay | Source: Ambulatory Visit | Attending: Obstetrics & Gynecology

## 2016-03-21 ENCOUNTER — Inpatient Hospital Stay (HOSPITAL_COMMUNITY): Payer: Medicaid Other

## 2016-03-21 ENCOUNTER — Inpatient Hospital Stay (HOSPITAL_COMMUNITY): Payer: Medicaid Other | Admitting: Anesthesiology

## 2016-03-21 ENCOUNTER — Encounter (HOSPITAL_COMMUNITY): Payer: Self-pay | Admitting: Anesthesiology

## 2016-03-21 DIAGNOSIS — Z98891 History of uterine scar from previous surgery: Secondary | ICD-10-CM

## 2016-03-21 DIAGNOSIS — Z3A35 35 weeks gestation of pregnancy: Secondary | ICD-10-CM

## 2016-03-21 DIAGNOSIS — O34219 Maternal care for unspecified type scar from previous cesarean delivery: Secondary | ICD-10-CM

## 2016-03-21 DIAGNOSIS — O30043 Twin pregnancy, dichorionic/diamniotic, third trimester: Secondary | ICD-10-CM

## 2016-03-21 DIAGNOSIS — O321XX2 Maternal care for breech presentation, fetus 2: Secondary | ICD-10-CM

## 2016-03-21 LAB — CBC
HCT: 31.9 % — ABNORMAL LOW (ref 36.0–46.0)
HEMOGLOBIN: 10.4 g/dL — AB (ref 12.0–15.0)
MCH: 26.5 pg (ref 26.0–34.0)
MCHC: 32.6 g/dL (ref 30.0–36.0)
MCV: 81.4 fL (ref 78.0–100.0)
Platelets: 307 10*3/uL (ref 150–400)
RBC: 3.92 MIL/uL (ref 3.87–5.11)
RDW: 16.1 % — ABNORMAL HIGH (ref 11.5–15.5)
WBC: 23.5 10*3/uL — ABNORMAL HIGH (ref 4.0–10.5)

## 2016-03-21 LAB — RPR: RPR: NONREACTIVE

## 2016-03-21 SURGERY — Surgical Case
Anesthesia: Spinal | Site: Abdomen

## 2016-03-21 MED ORDER — WITCH HAZEL-GLYCERIN EX PADS: 1.0000 "application " | MEDICATED_PAD | CUTANEOUS | Status: DC | PRN

## 2016-03-21 MED ORDER — OXYCODONE-ACETAMINOPHEN 5-325 MG PO TABS
1.0000 | ORAL_TABLET | Freq: Once | ORAL | Status: AC
Start: 2016-03-21 — End: 2016-03-21
  Administered 2016-03-21: 1 via ORAL
  Filled 2016-03-21: qty 1

## 2016-03-21 MED ORDER — DIBUCAINE 1 % RE OINT: 1.0000 "application " | TOPICAL_OINTMENT | RECTAL | Status: DC | PRN

## 2016-03-21 MED ORDER — SIMETHICONE 80 MG PO CHEW: 80.0000 mg | CHEWABLE_TABLET | ORAL | Status: DC | PRN

## 2016-03-21 MED ORDER — DIPHENHYDRAMINE HCL 50 MG/ML IJ SOLN: 12.5000 mg | INTRAMUSCULAR | Status: DC | PRN

## 2016-03-21 MED ORDER — PHENYLEPHRINE 8 MG IN D5W 100 ML (0.08MG/ML) PREMIX OPTIME
INJECTION | INTRAVENOUS | Status: AC
  Filled 2016-03-21: qty 100

## 2016-03-21 MED ORDER — OXYTOCIN 10 UNIT/ML IJ SOLN: 2.5000 [IU]/h | INTRAVENOUS | Status: AC

## 2016-03-21 MED ORDER — METHYLERGONOVINE MALEATE 0.2 MG PO TABS: 0.2000 mg | ORAL_TABLET | ORAL | Status: DC | PRN

## 2016-03-21 MED ORDER — NALBUPHINE HCL 10 MG/ML IJ SOLN: 5.0000 mg | Freq: Once | INTRAMUSCULAR | Status: DC | PRN

## 2016-03-21 MED ORDER — FENTANYL CITRATE (PF) 100 MCG/2ML IJ SOLN
INTRAMUSCULAR | Status: DC | PRN
  Administered 2016-03-21: 20 ug via INTRATHECAL

## 2016-03-21 MED ORDER — NALOXONE HCL 0.4 MG/ML IJ SOLN: 0.4000 mg | INTRAMUSCULAR | Status: DC | PRN

## 2016-03-21 MED ORDER — LACTATED RINGERS IV SOLN
INTRAVENOUS | Status: DC | PRN
Start: 2016-03-21 — End: 2016-03-21
  Administered 2016-03-21: 12:00:00 via INTRAVENOUS

## 2016-03-21 MED ORDER — BUPIVACAINE IN DEXTROSE 0.75-8.25 % IT SOLN
INTRATHECAL | Status: DC | PRN
  Administered 2016-03-21: 1.2 mL via INTRATHECAL

## 2016-03-21 MED ORDER — LACTATED RINGERS IV SOLN
INTRAVENOUS | Status: DC
  Administered 2016-03-21: 999 mL via INTRAVENOUS
  Administered 2016-03-22: 500 mL via INTRAVENOUS
  Administered 2016-03-22: 13:00:00 via INTRAVENOUS

## 2016-03-21 MED ORDER — PHENYLEPHRINE 8 MG IN D5W 100 ML (0.08MG/ML) PREMIX OPTIME
INJECTION | INTRAVENOUS | Status: DC | PRN
  Administered 2016-03-21: 60 ug/min via INTRAVENOUS

## 2016-03-21 MED ORDER — NALOXONE HCL 2 MG/2ML IJ SOSY
1.0000 ug/kg/h | PREFILLED_SYRINGE | INTRAVENOUS | Status: DC | PRN
  Filled 2016-03-21: qty 2

## 2016-03-21 MED ORDER — ZOLPIDEM TARTRATE 5 MG PO TABS: 5.0000 mg | ORAL_TABLET | Freq: Every evening | ORAL | Status: DC | PRN

## 2016-03-21 MED ORDER — METHYLERGONOVINE MALEATE 0.2 MG/ML IJ SOLN
INTRAMUSCULAR | Status: DC | PRN
  Administered 2016-03-21: 0.2 mg via INTRAMUSCULAR

## 2016-03-21 MED ORDER — SODIUM CHLORIDE 0.9% FLUSH: 3.0000 mL | INTRAVENOUS | Status: DC | PRN

## 2016-03-21 MED ORDER — SCOPOLAMINE 1 MG/3DAYS TD PT72
MEDICATED_PATCH | TRANSDERMAL | Status: DC | PRN
  Administered 2016-03-21: 1 via TRANSDERMAL

## 2016-03-21 MED ORDER — KETOROLAC TROMETHAMINE 30 MG/ML IJ SOLN: 30.0000 mg | Freq: Four times a day (QID) | INTRAMUSCULAR | Status: AC | PRN

## 2016-03-21 MED ORDER — IBUPROFEN 600 MG PO TABS
600.0000 mg | ORAL_TABLET | Freq: Four times a day (QID) | ORAL | Status: DC
  Administered 2016-03-21 – 2016-03-24 (×12): 600 mg via ORAL
  Filled 2016-03-21 (×13): qty 1

## 2016-03-21 MED ORDER — DEXTROSE 5 % IV SOLN
3.0000 g | INTRAVENOUS | Status: DC | PRN
  Administered 2016-03-21: 3 g via INTRAVENOUS

## 2016-03-21 MED ORDER — LANOLIN HYDROUS EX OINT: 1.0000 "application " | TOPICAL_OINTMENT | CUTANEOUS | Status: DC | PRN

## 2016-03-21 MED ORDER — ONDANSETRON HCL 4 MG/2ML IJ SOLN
INTRAMUSCULAR | Status: DC | PRN
  Administered 2016-03-21: 4 mg via INTRAVENOUS

## 2016-03-21 MED ORDER — METHYLERGONOVINE MALEATE 0.2 MG/ML IJ SOLN
INTRAMUSCULAR | Status: AC
  Filled 2016-03-21: qty 1

## 2016-03-21 MED ORDER — DIPHENHYDRAMINE HCL 25 MG PO CAPS
25.0000 mg | ORAL_CAPSULE | Freq: Four times a day (QID) | ORAL | Status: DC | PRN
  Filled 2016-03-21: qty 1

## 2016-03-21 MED ORDER — METHYLERGONOVINE MALEATE 0.2 MG/ML IJ SOLN: 0.2000 mg | INTRAMUSCULAR | Status: DC | PRN

## 2016-03-21 MED ORDER — PRENATAL MULTIVITAMIN CH
1.0000 | ORAL_TABLET | Freq: Every day | ORAL | Status: DC
  Administered 2016-03-22 – 2016-03-23 (×2): 1 via ORAL
  Filled 2016-03-21 (×2): qty 1

## 2016-03-21 MED ORDER — KETOROLAC TROMETHAMINE 30 MG/ML IJ SOLN
INTRAMUSCULAR | Status: AC
  Filled 2016-03-21: qty 1

## 2016-03-21 MED ORDER — KETOROLAC TROMETHAMINE 30 MG/ML IJ SOLN
30.0000 mg | Freq: Four times a day (QID) | INTRAMUSCULAR | Status: AC | PRN
  Administered 2016-03-21: 30 mg via INTRAVENOUS

## 2016-03-21 MED ORDER — LACTATED RINGERS IV BOLUS (SEPSIS)
500.0000 mL | INTRAVENOUS | Status: DC | PRN
  Administered 2016-03-21: 500 mL via INTRAVENOUS

## 2016-03-21 MED ORDER — CITRIC ACID-SODIUM CITRATE 334-500 MG/5ML PO SOLN
ORAL | Status: AC
  Administered 2016-03-21: 30 mL
  Filled 2016-03-21: qty 15

## 2016-03-21 MED ORDER — TETANUS-DIPHTH-ACELL PERTUSSIS 5-2.5-18.5 LF-MCG/0.5 IM SUSP
0.5000 mL | Freq: Once | INTRAMUSCULAR | Status: AC
  Administered 2016-03-22: 0.5 mL via INTRAMUSCULAR
  Filled 2016-03-21: qty 0.5

## 2016-03-21 MED ORDER — MENTHOL 3 MG MT LOZG: 1.0000 | LOZENGE | OROMUCOSAL | Status: DC | PRN

## 2016-03-21 MED ORDER — FENTANYL CITRATE (PF) 100 MCG/2ML IJ SOLN: 25.0000 ug | INTRAMUSCULAR | Status: DC | PRN

## 2016-03-21 MED ORDER — DEXTROSE 5 % IV SOLN
INTRAVENOUS | Status: AC
  Filled 2016-03-21: qty 3000

## 2016-03-21 MED ORDER — OXYTOCIN 10 UNIT/ML IJ SOLN
INTRAMUSCULAR | Status: AC
  Filled 2016-03-21: qty 4

## 2016-03-21 MED ORDER — OXYCODONE-ACETAMINOPHEN 5-325 MG PO TABS
1.0000 | ORAL_TABLET | ORAL | Status: DC | PRN
  Administered 2016-03-22 (×4): 1 via ORAL
  Administered 2016-03-23 – 2016-03-24 (×6): 2 via ORAL
  Administered 2016-03-24: 1 via ORAL
  Filled 2016-03-21: qty 2
  Filled 2016-03-21: qty 1
  Filled 2016-03-21 (×4): qty 2
  Filled 2016-03-21 (×2): qty 1
  Filled 2016-03-21: qty 2
  Filled 2016-03-21 (×2): qty 1

## 2016-03-21 MED ORDER — OXYTOCIN 10 UNIT/ML IJ SOLN
40.0000 [IU] | INTRAVENOUS | Status: DC | PRN
  Administered 2016-03-21: 40 [IU] via INTRAVENOUS

## 2016-03-21 MED ORDER — MORPHINE SULFATE (PF) 0.5 MG/ML IJ SOLN
INTRAMUSCULAR | Status: DC | PRN
  Administered 2016-03-21: .2 mg via INTRATHECAL

## 2016-03-21 MED ORDER — FENTANYL CITRATE (PF) 100 MCG/2ML IJ SOLN
INTRAMUSCULAR | Status: AC
  Filled 2016-03-21: qty 2

## 2016-03-21 MED ORDER — ACETAMINOPHEN 325 MG PO TABS
650.0000 mg | ORAL_TABLET | ORAL | Status: DC | PRN
  Administered 2016-03-21: 650 mg via ORAL
  Filled 2016-03-21: qty 2

## 2016-03-21 MED ORDER — LACTATED RINGERS IV SOLN
INTRAVENOUS | Status: DC
  Administered 2016-03-21 (×2): via INTRAVENOUS

## 2016-03-21 MED ORDER — NALBUPHINE HCL 10 MG/ML IJ SOLN
10.0000 mg | Freq: Once | INTRAMUSCULAR | Status: AC
  Administered 2016-03-21: 10 mg via INTRAMUSCULAR
  Filled 2016-03-21: qty 1

## 2016-03-21 MED ORDER — NALBUPHINE HCL 10 MG/ML IJ SOLN: 5.0000 mg | INTRAMUSCULAR | Status: DC | PRN

## 2016-03-21 MED ORDER — ONDANSETRON HCL 4 MG/2ML IJ SOLN
4.0000 mg | Freq: Three times a day (TID) | INTRAMUSCULAR | Status: DC | PRN
Start: 2016-03-21 — End: 2016-03-24
  Administered 2016-03-21: 4 mg via INTRAVENOUS
  Filled 2016-03-21: qty 2

## 2016-03-21 MED ORDER — ZOLPIDEM TARTRATE 5 MG PO TABS
5.0000 mg | ORAL_TABLET | Freq: Once | ORAL | Status: AC
  Administered 2016-03-21: 5 mg via ORAL
  Filled 2016-03-21: qty 1

## 2016-03-21 MED ORDER — SCOPOLAMINE 1 MG/3DAYS TD PT72
MEDICATED_PATCH | TRANSDERMAL | Status: AC
  Filled 2016-03-21: qty 1

## 2016-03-21 MED ORDER — MEPERIDINE HCL 25 MG/ML IJ SOLN
6.2500 mg | INTRAMUSCULAR | Status: DC | PRN
Start: 2016-03-21 — End: 2016-03-21

## 2016-03-21 MED ORDER — SENNOSIDES-DOCUSATE SODIUM 8.6-50 MG PO TABS
2.0000 | ORAL_TABLET | ORAL | Status: DC
  Administered 2016-03-22 – 2016-03-23 (×3): 2 via ORAL
  Filled 2016-03-21 (×3): qty 2

## 2016-03-21 MED ORDER — SIMETHICONE 80 MG PO CHEW
80.0000 mg | CHEWABLE_TABLET | ORAL | Status: DC
  Administered 2016-03-22 – 2016-03-23 (×4): 80 mg via ORAL
  Filled 2016-03-21 (×3): qty 1

## 2016-03-21 MED ORDER — SIMETHICONE 80 MG PO CHEW
80.0000 mg | CHEWABLE_TABLET | Freq: Three times a day (TID) | ORAL | Status: DC
  Administered 2016-03-21 – 2016-03-24 (×7): 80 mg via ORAL
  Filled 2016-03-21 (×8): qty 1

## 2016-03-21 MED ORDER — DEXAMETHASONE SODIUM PHOSPHATE 4 MG/ML IJ SOLN
INTRAMUSCULAR | Status: DC | PRN
  Administered 2016-03-21: 4 mg via INTRAVENOUS

## 2016-03-21 MED ORDER — IBUPROFEN 600 MG PO TABS
600.0000 mg | ORAL_TABLET | Freq: Four times a day (QID) | ORAL | Status: DC | PRN
  Filled 2016-03-21: qty 1

## 2016-03-21 MED ORDER — DIPHENHYDRAMINE HCL 25 MG PO CAPS
25.0000 mg | ORAL_CAPSULE | ORAL | Status: DC | PRN
  Administered 2016-03-22 (×2): 25 mg via ORAL
  Filled 2016-03-21 (×2): qty 1

## 2016-03-21 MED ORDER — DEXAMETHASONE SODIUM PHOSPHATE 4 MG/ML IJ SOLN
INTRAMUSCULAR | Status: AC
  Filled 2016-03-21: qty 1

## 2016-03-21 MED ORDER — ONDANSETRON HCL 4 MG/2ML IJ SOLN
INTRAMUSCULAR | Status: AC
  Filled 2016-03-21: qty 2

## 2016-03-21 MED ORDER — SCOPOLAMINE 1 MG/3DAYS TD PT72: 1.0000 | MEDICATED_PATCH | Freq: Once | TRANSDERMAL | Status: DC

## 2016-03-21 MED ORDER — MORPHINE SULFATE (PF) 0.5 MG/ML IJ SOLN
INTRAMUSCULAR | Status: AC
  Filled 2016-03-21: qty 10

## 2016-03-21 SURGICAL SUPPLY — 40 items
CHLORAPREP W/TINT 26ML (MISCELLANEOUS) ×6 IMPLANT
CLAMP CORD UMBIL (MISCELLANEOUS) IMPLANT
CLOTH BEACON ORANGE TIMEOUT ST (SAFETY) ×3 IMPLANT
DRSG EMULSION OIL 3X8 NADH (GAUZE/BANDAGES/DRESSINGS) ×2 IMPLANT
DRSG OPSITE POSTOP 4X10 (GAUZE/BANDAGES/DRESSINGS) ×3 IMPLANT
ELECT REM PT RETURN 9FT ADLT (ELECTROSURGICAL) ×3
ELECTRODE REM PT RTRN 9FT ADLT (ELECTROSURGICAL) ×1 IMPLANT
EXTRACTOR VACUUM BELL STYLE (SUCTIONS) IMPLANT
GAUZE SPONGE 4X4 3PLY NS LF (GAUZE/BANDAGES/DRESSINGS) ×2 IMPLANT
GLOVE BIOGEL PI IND STRL 7.0 (GLOVE) ×2 IMPLANT
GLOVE BIOGEL PI IND STRL 8 (GLOVE) ×1 IMPLANT
GLOVE BIOGEL PI INDICATOR 7.0 (GLOVE) ×4
GLOVE BIOGEL PI INDICATOR 8 (GLOVE) ×2
GLOVE ECLIPSE 8.0 STRL XLNG CF (GLOVE) ×3 IMPLANT
GOWN STRL REUS W/TWL LRG LVL3 (GOWN DISPOSABLE) ×6 IMPLANT
KIT ABG SYR 3ML LUER SLIP (SYRINGE) ×3 IMPLANT
LIQUID BAND (GAUZE/BANDAGES/DRESSINGS) ×3 IMPLANT
NDL HYPO 18GX1.5 BLUNT FILL (NEEDLE) ×1 IMPLANT
NDL HYPO 25X5/8 SAFETYGLIDE (NEEDLE) ×1 IMPLANT
NEEDLE HYPO 18GX1.5 BLUNT FILL (NEEDLE) ×3 IMPLANT
NEEDLE HYPO 22GX1.5 SAFETY (NEEDLE) ×3 IMPLANT
NEEDLE HYPO 25X5/8 SAFETYGLIDE (NEEDLE) ×3 IMPLANT
NS IRRIG 1000ML POUR BTL (IV SOLUTION) ×3 IMPLANT
PACK C SECTION WH (CUSTOM PROCEDURE TRAY) ×3 IMPLANT
PAD ABD 8X7 1/2 STERILE (GAUZE/BANDAGES/DRESSINGS) ×2 IMPLANT
PAD OB MATERNITY 4.3X12.25 (PERSONAL CARE ITEMS) ×3 IMPLANT
PENCIL SMOKE EVAC W/HOLSTER (ELECTROSURGICAL) ×3 IMPLANT
RTRCTR C-SECT PINK 25CM LRG (MISCELLANEOUS) IMPLANT
SUT CHROMIC 0 CT 1 (SUTURE) ×3 IMPLANT
SUT MNCRL 0 VIOLET CTX 36 (SUTURE) ×2 IMPLANT
SUT MONOCRYL 0 CTX 36 (SUTURE) ×4
SUT PLAIN 2 0 (SUTURE)
SUT PLAIN 2 0 XLH (SUTURE) IMPLANT
SUT PLAIN ABS 2-0 CT1 27XMFL (SUTURE) IMPLANT
SUT VIC AB 0 CTX 36 (SUTURE) ×3
SUT VIC AB 0 CTX36XBRD ANBCTRL (SUTURE) ×1 IMPLANT
SUT VIC AB 4-0 KS 27 (SUTURE) IMPLANT
SYR 20CC LL (SYRINGE) ×6 IMPLANT
TOWEL OR 17X24 6PK STRL BLUE (TOWEL DISPOSABLE) ×3 IMPLANT
TRAY FOLEY CATH SILVER 14FR (SET/KITS/TRAYS/PACK) ×3 IMPLANT

## 2016-03-21 NOTE — Progress Notes (Addendum)
Dr Jolayne Pantherconstant on phone - twin b having lates -  Pt contracting - new order for iv and bolus - will continue to monitor stated she has been viewing strip

## 2016-03-21 NOTE — Anesthesia Procedure Notes (Signed)
Spinal Patient location during procedure: OR Start time: 03/21/2016 11:02 AM Staffing Anesthesiologist: Mal AmabileFOSTER, Reyna Lorenzi Performed by: anesthesiologist  Preanesthetic Checklist Completed: patient identified, site marked, surgical consent, pre-op evaluation, timeout performed, IV checked, risks and benefits discussed and monitors and equipment checked Spinal Block Patient position: sitting Prep: site prepped and draped and DuraPrep Patient monitoring: heart rate, cardiac monitor, continuous pulse ox and blood pressure Approach: midline Location: L3-4 Injection technique: single-shot Needle Needle type: Pencan  Needle gauge: 24 G Needle length: 9 cm Assessment Sensory level: T4 Additional Notes Patient tolerated procedure well. Attempt x 4. Difficult due to poor positioning and MO. Adequate sensory level.

## 2016-03-21 NOTE — Progress Notes (Signed)
Called Philipp DeputyKim Shaw cnm to request stronger pain medicine (percocet or oxy IR) to be available for patient if she needs it. New orders.  Theda SersJan Ciarrah Rae, RN

## 2016-03-21 NOTE — Addendum Note (Signed)
Addendum  created 03/21/16 1531 by Renford DillsJanet L Belia Febo, CRNA   Modules edited: Clinical Notes   Clinical Notes:  File: [id number]

## 2016-03-21 NOTE — Progress Notes (Signed)
Bedside US in progress

## 2016-03-21 NOTE — Lactation Note (Signed)
This note was copied from a baby's chart. Lactation Consultation Note  Patient Name: Anne ChancellorBoyA [name] WUJWJ'XToday's Date: 03/21/2016 Reason for consult: Initial assessment;Multiple gestation;Late preterm infant;Infant < 6lbs   Spoke with mother . Mom reports twin B was transferred to NICU for low bleed glucose. Mom reports she has decided to switch to bottle feeding. Offered services if mom wants to BF or pump.    Maternal Data Formula Feeding for Exclusion: Yes  Feeding Feeding Type: Bottle Fed - Formula  LATCH Score/Interventions                      Lactation Tools Discussed/Used     Consult Status Consult Status: Complete    Ed BlalockSharon S Mel Tadros 03/21/2016, 10:27 PM

## 2016-03-21 NOTE — Op Note (Signed)
Preoperative diagnosis:  1.  Intrauterine pregnancy at 557w5d  weeks gestation                                         2.  DiDi Twins                                         3.  Non reassuring fetal status of both twins                                         4.  Previous Caesarean section x2                                         5. Twin B breech   Postoperative diagnosis:  Same as above   Procedure: Repeat Caesarean section  Surgeon:  Lazaro ArmsLuther H Eure MD  Assistant:    Anesthesia: Spinal  Findings:  .    Over a low transverse incision was delivered Twin A a viable female at 1129 with Apgars of 8 and 9.  Twin B was delivered at 1130 with Apgars of 8 and 9 weighing 4 lb 12 oz. Placentas were fused and I did not investigate the membranes further.  Uterus, tubes and ovaries were all normal.  There were no other significant findings  Description of operation:  Patient was taken to the operating room and placed in the sitting position where she underwent a spinal anesthetic. She was then placed in the supine position with tilt to the left side. When adequate anesthetic level was obtained she was prepped and draped in usual sterile fashion and a Foley catheter was placed. A Pfannenstiel skin incision was made and carried down sharply to the rectus fascia which was scored in the midline extended laterally. The fascia was taken off the muscles both superiorly and without difficulty. The muscles were divided.  The peritoneal cavity was entered.  Bladder blade was placed, no bladder flap was created.  A low transverse hysterotomy incision was made and delivered a viable female  infant at 1129 with Apgars of 8 and 9 weighing lbs  oz.  Cord pH was obtained and was pending.  Twin B was a viable female at 1130 Apgars 8 and 9. The uterus was exteriorized. It was closed in 2 layers, the first being a running interlocking layer and the second being an imbricating layer using 0 monocryl on a CTX needle. There was good  resulting hemostasis. The uterus tubes and ovaries were all normal. Peritoneal cavity was irrigated vigorously. The muscles and peritoneum were reapproximated loosely. The fascia was closed using 0 Vicryl in running fashion. Subcutaneous tissue was made hemostatic and irrigated. The skin was closed usingskin staples. Blood loss for the procedure was 750 cc. The patient received a gram of Ancef prophylactically. The patient was taken to the recovery room in good stable condition with all counts being correct x3.  EBL 750 cc  Lazaro ArmsEURE,LUTHER H, MD Attending Physician for the Center for [facility] 03/21/2016 12:11 PM

## 2016-03-21 NOTE — Progress Notes (Signed)
Patient ID: Laurine BlazerChiquita J Friedly, female   DOB: 01/08/1986, 30 y.o.   MRN: [id number] 6725w5d didi twins with non reassuring fetal status: Twin A 4/8 BPP Twin B:6/8 Twin B was having recurrent decelerations as well  Will proceed with delivery by Caesarean section, pt declines BTL  Pt agrees with management plan  Rockne CoonsLuther H Eure Jr MD Attending Physician for the Center [facility] 03/21/2016 10:34 AM

## 2016-03-21 NOTE — Transfer of Care (Signed)
Immediate Anesthesia Transfer of Care Note  Patient: Anne Richardson  Procedure(s) Performed: Procedure(s): CESAREAN SECTION (N/A)  Patient Location: PACU  Anesthesia Type:Spinal  Level of Consciousness: awake, alert  and oriented  Airway & Oxygen Therapy: Patient Spontanous Breathing and Patient connected to nasal cannula oxygen  Post-op Assessment: Report given to RN and Post -op Vital signs reviewed and stable  Post vital signs: Reviewed and stable  Last Vitals:  Filed Vitals:   03/21/16 0819 03/21/16 0930  BP: 114/59   Pulse: 105   Temp: 37.2 C   Resp: 18 20    Complications: No apparent anesthesia complications

## 2016-03-21 NOTE — Anesthesia Preprocedure Evaluation (Signed)
Anesthesia Evaluation  Patient identified by MRN, date of birth, ID band Patient awake    Reviewed: Allergy & Precautions, NPO status , Patient's Chart, lab work & pertinent test results  Airway Mallampati: III  TM Distance: >3 FB Neck ROM: Full    Dental no notable dental hx. (+) Teeth Intact   Pulmonary neg pulmonary ROS,    Pulmonary exam normal breath sounds clear to auscultation       Cardiovascular hypertension, Normal cardiovascular exam Rhythm:Regular Rate:Normal     Neuro/Psych negative neurological ROS  negative psych ROS   GI/Hepatic Neg liver ROS, GERD  Medicated and Controlled,  Endo/Other  Morbid obesity  Renal/GU negative Renal ROS  negative genitourinary   Musculoskeletal   Abdominal (+) + obese,   Peds  Hematology  (+) anemia ,   Anesthesia Other Findings   Reproductive/Obstetrics (+) Pregnancy Twin gestation Previous C/Section x 2                             Anesthesia Physical Anesthesia Plan  ASA: III and emergent  Anesthesia Plan: Spinal   Post-op Pain Management:    Induction:   Airway Management Planned: Natural Airway  Additional Equipment:   Intra-op Plan:   Post-operative Plan:   Informed Consent: I have reviewed the patients History and Physical, chart, labs and discussed the procedure including the risks, benefits and alternatives for the proposed anesthesia with the patient or authorized representative who has indicated his/her understanding and acceptance.     Plan Discussed with: Anesthesiologist, CRNA and Surgeon  Anesthesia Plan Comments:         Anesthesia Quick Evaluation

## 2016-03-21 NOTE — Anesthesia Postprocedure Evaluation (Signed)
Anesthesia Post Note  Patient: Anne Richardson  Procedure(s) Performed: Procedure(s) (LRB): CESAREAN SECTION (N/A)  Patient location during evaluation: Mother Baby Anesthesia Type: Spinal Level of consciousness: awake Pain management: pain level controlled Vital Signs Assessment: post-procedure vital signs reviewed and stable Respiratory status: spontaneous breathing Cardiovascular status: stable Postop Assessment: no headache, no backache, spinal receding, patient able to bend at knees, no signs of nausea or vomiting and adequate PO intake Anesthetic complications: no    Last Vitals:  Filed Vitals:   03/21/16 1400 03/21/16 1500  BP: 113/51 136/64  Pulse: 96 84  Temp: 37 C 36.9 C  Resp: 20 20    Last Pain:  Filed Vitals:   03/21/16 1518  PainSc: 3                  Dolores Mcgovern

## 2016-03-21 NOTE — Anesthesia Postprocedure Evaluation (Signed)
Anesthesia Post Note  Patient: Anne Richardson  Procedure(s) Performed: Procedure(s) (LRB): CESAREAN SECTION (N/A)  Patient location during evaluation: PACU Anesthesia Type: Spinal Level of consciousness: awake and alert and oriented Pain management: pain level controlled Vital Signs Assessment: post-procedure vital signs reviewed and stable Respiratory status: spontaneous breathing, nonlabored ventilation and respiratory function stable Cardiovascular status: blood pressure returned to baseline and stable Postop Assessment: no signs of nausea or vomiting, no headache, no backache, spinal receding and patient able to bend at knees Anesthetic complications: no    Last Vitals:  Filed Vitals:   03/21/16 1245 03/21/16 1300  BP: 119/72 113/62  Pulse: 102 86  Temp:    Resp: 16 20    Last Pain:  Filed Vitals:   03/21/16 1313  PainSc: 0-No pain                 Brad Lieurance A.

## 2016-03-22 LAB — CBC
HEMATOCRIT: 29.1 % — AB (ref 36.0–46.0)
HEMOGLOBIN: 9.5 g/dL — AB (ref 12.0–15.0)
MCH: 26.2 pg (ref 26.0–34.0)
MCHC: 32.6 g/dL (ref 30.0–36.0)
MCV: 80.2 fL (ref 78.0–100.0)
Platelets: 276 10*3/uL (ref 150–400)
RBC: 3.63 MIL/uL — ABNORMAL LOW (ref 3.87–5.11)
RDW: 16.1 % — AB (ref 11.5–15.5)
WBC: 22.6 10*3/uL — AB (ref 4.0–10.5)

## 2016-03-22 MED ORDER — FUROSEMIDE 10 MG/ML IJ SOLN
20.0000 mg | Freq: Once | INTRAMUSCULAR | Status: AC
  Administered 2016-03-22: 20 mg via INTRAVENOUS
  Filled 2016-03-22: qty 2

## 2016-03-22 MED ORDER — LACTATED RINGERS IV BOLUS (SEPSIS)
500.0000 mL | Freq: Once | INTRAVENOUS | Status: AC
  Administered 2016-03-22: 500 mL via INTRAVENOUS

## 2016-03-22 MED ORDER — METRONIDAZOLE 500 MG PO TABS
500.0000 mg | ORAL_TABLET | Freq: Two times a day (BID) | ORAL | Status: DC
  Administered 2016-03-22 – 2016-03-24 (×5): 500 mg via ORAL
  Filled 2016-03-22 (×7): qty 1

## 2016-03-22 NOTE — Progress Notes (Signed)
CSW acknowledges NICU admission.    Patient screened out for psychosocial assessment since none of the following apply:  Psychosocial stressors documented in mother or baby's chart  Gestation less than 32 weeks  Code at delivery   Infant with anomalies  Please contact the Clinical Social Worker if specific needs arise, or by [name]'s request.       

## 2016-03-22 NOTE — Progress Notes (Signed)
Post OP Day 1 Subjective:  Anne Richardson is a 30 y.o. [id number] 5814w5d s/p RLTCS.  No acute events overnight.  Pt denies problems with ambulating, voiding or po intake.  She denies nausea or vomiting.  Pain is moderately controlled.  She has not had flatus. She has not had bowel movement.  Lochia Moderate.  Plan for birth control is unsure.  Method of Feeding: breast/bottle.  One infant in NICU for low BG.  She denies LE, orthopnea, SOB with exertion.  Objective: Blood pressure 119/65, pulse 83, temperature 98.3 F (36.8 C), temperature source Oral, resp. rate 18, height 5\' 4"  (1.626 m), weight 282 lb (127.914 kg), last menstrual period 04/08/2015, SpO2 99 %, unknown if currently breastfeeding.  Physical Exam:  General: alert, cooperative and no distress Lochia:normal flow Chest: CTAB, normal WOB on room air. Heart: RRR no m/r/g Abdomen: +BS, soft, nontender, incision covered by rubber bandage.  No surrounding erythema. Uterine Fundus: firm, at level of umbilicus DVT Evaluation: No evidence of DVT seen on physical exam. Extremities: no edema GU: foley in place draining dark yellow urine.  Recent Labs  03/21/16 0820 03/22/16 0552  HGB 10.4* 9.5*  HCT 31.9* 29.1*    Assessment/Plan:  ASSESSMENT: Anne Richardson is a 30 y.o. [id number] 214w5d s/p RLTCS <1 point drop in hgb.  Recheck in am. About 3L up.  UOP less than expected.  Will repeat 500cc bolus LR and Lasix 20mg  x1 IV per Dr Despina HiddenEure Continue to monitor UOP Continues to have leukocytosis.  Abdominal TTP only near incision.  Afebrile overnight.  Continue to monitor closely.  Repeat CBC in am as above. Home Flagyl for BV resumed. Has 5 days for completion.   LOS: 2 days   Delynn FlavinAshly Gottschalk, DO PGY-2, [facility] 03/22/2016, 7:20 AM

## 2016-03-23 ENCOUNTER — Encounter (HOSPITAL_COMMUNITY): Payer: Self-pay | Admitting: Obstetrics & Gynecology

## 2016-03-23 LAB — CBC
HEMATOCRIT: 28.4 % — AB (ref 36.0–46.0)
HEMOGLOBIN: 9.2 g/dL — AB (ref 12.0–15.0)
MCH: 26.1 pg (ref 26.0–34.0)
MCHC: 32.4 g/dL (ref 30.0–36.0)
MCV: 80.5 fL (ref 78.0–100.0)
Platelets: 294 10*3/uL (ref 150–400)
RBC: 3.53 MIL/uL — AB (ref 3.87–5.11)
RDW: 16.3 % — ABNORMAL HIGH (ref 11.5–15.5)
WBC: 18.7 10*3/uL — ABNORMAL HIGH (ref 4.0–10.5)

## 2016-03-23 NOTE — Progress Notes (Signed)
Subjective: Postpartum Day 2: Cesarean Delivery Patient reports incisional pain, tolerating PO, + flatus and no problems voiding.    Objective: Vital signs in last 24 hours: Temp:  [98 F (36.7 C)-98.3 F (36.8 C)] 98 F (36.7 C) (04/03 16100611) Pulse Rate:  [81-106] 106 (04/03 0611) Resp:  [18] 18 (04/03 0611) BP: (114-131)/(51-81) 122/59 mmHg (04/03 0611) SpO2:  [97 %] 97 % (04/02 1048)  Physical Exam:  General: alert, cooperative, appears stated age and no distress Lochia: appropriate Uterine Fundus: firm Incision: healing well, no significant drainage, no dehiscence, no significant erythema DVT Evaluation: No evidence of DVT seen on physical exam. Negative Homan's sign. No cords or calf tenderness.   Recent Labs  03/22/16 0552 03/23/16 0538  HGB 9.5* 9.2*  HCT 29.1* 28.4*    Assessment/Plan: Status post Cesarean section. Doing well postoperatively.  Continue current care.  Wyvonnia DuskyLAWSON, Lanna Labella DARLENE 03/23/2016, 7:42 AM

## 2016-03-24 MED ORDER — OXYCODONE-ACETAMINOPHEN 5-325 MG PO TABS: 1.0000 | ORAL_TABLET | ORAL | Status: DC | PRN

## 2016-03-24 MED ORDER — IBUPROFEN 600 MG PO TABS: 600.0000 mg | ORAL_TABLET | Freq: Four times a day (QID) | ORAL | Status: DC | PRN

## 2016-03-24 NOTE — Discharge Instructions (Signed)
Your bandage should start falling off in the next 2-3 days.  If it has not, plan to remove it.  You may shower but avoid baths for the next week.  Postpartum Care After Cesarean Delivery After you deliver your newborn (postpartum period), the usual stay in the hospital is 24-72 hours. If there were problems with your labor or delivery, or if you have other medical problems, you might be in the hospital longer.  While you are in the hospital, you will receive help and instructions on how to care for yourself and your newborn during the postpartum period.  While you are in the hospital:  It is normal for you to have pain or discomfort from the incision in your abdomen. Be sure to tell your nurses when you are having pain, where the pain is located, and what makes the pain worse.  If you are breastfeeding, you may feel uncomfortable contractions of your uterus for a couple of weeks. This is normal. The contractions help your uterus get back to normal size.  It is normal to have some bleeding after delivery.  For the first 1-3 days after delivery, the flow is red and the amount may be similar to a period.  It is common for the flow to start and stop.  In the first few days, you may pass some small clots. Let your nurses know if you begin to pass large clots or your flow increases.  Do not  flush blood clots down the toilet before having the nurse look at them.  During the next 3-10 days after delivery, your flow should become more watery and pink or brown-tinged in color.  Ten to fourteen days after delivery, your flow should be a small amount of yellowish-white discharge.  The amount of your flow will decrease over the first few weeks after delivery. Your flow may stop in 6-8 weeks. Most women have had their flow stop by 12 weeks after delivery.  You should change your sanitary pads frequently.  Wash your hands thoroughly with soap and water for at least 20 seconds after changing pads,  using the toilet, or before holding or feeding your newborn.  Your intravenous (IV) tubing will be removed when you are drinking enough fluids.  The urine drainage tube (urinary catheter) that was inserted before delivery may be removed within 6-8 hours after delivery or when feeling returns to your legs. You should feel like you need to empty your bladder within the first 6-8 hours after the catheter has been removed.  In case you become weak, lightheaded, or faint, call your nurse before you get out of bed for the first time and before you take a shower for the first time.  Within the first few days after delivery, your breasts may begin to feel tender and full. This is called engorgement. Breast tenderness usually goes away within 48-72 hours after engorgement occurs. You may also notice milk leaking from your breasts. If you are not breastfeeding, do not stimulate your breasts. Breast stimulation can make your breasts produce more milk.  Spending as much time as possible with your newborn is very important. During this time, you and your newborn can feel close and get to know each other. Having your newborn stay in your room (rooming in) will help to strengthen the bond with your newborn. It will give you time to get to know your newborn and become comfortable caring for your newborn.  Your hormones change after delivery. Sometimes the hormone  changes can temporarily cause you to feel sad or tearful. These feelings should not last more than a few days. If these feelings last longer than that, you should talk to your caregiver.  If desired, talk to your caregiver about methods of family planning or contraception.  Talk to your caregiver about immunizations. Your caregiver may want you to have the following immunizations before leaving the hospital:  Tetanus, diphtheria, and pertussis (Tdap) or tetanus and diphtheria (Td) immunization. It is very important that you and your family (including  grandparents) or others caring for your newborn are up-to-date with the Tdap or Td immunizations. The Tdap or Td immunization can help protect your newborn from getting ill.  Rubella immunization.  Varicella (chickenpox) immunization.  Influenza immunization. You should receive this annual immunization if you did not receive the immunization during your pregnancy.   This information is not intended to replace advice given to you by your health care provider. Make sure you discuss any questions you have with your health care provider.   Document Released: 08/31/2012 Document Reviewed: 08/31/2012 Elsevier Interactive Patient Education [employer]2016 Elsevier Inc.

## 2016-03-24 NOTE — Progress Notes (Signed)
This RN changed honeycomb dressing over incision before discharge.  Staples were noted to be underneath the dressing; reviewed discharge paperwork and orders and did not find an order to remove staples.  Discharge instructions stated to see MD for 6 week check up.  Called MD Dr. Nancy MarusMayo and spoke to her about staples.  Instructed to inform patient to call clinic and make an appointment to have staples removed 7 days after delivery.  Since that would be Saturday, she was instructed to have them removed on Friday.  Dr. Nancy MarusMayo OK with plan.  Instructed patient to remove dressing if it starts to come off and water gets underneath.  Instructions for when to make her appointment were written on her discharge paper.    Vivi MartensAshley Hawthorne Day RN

## 2016-03-24 NOTE — Discharge Summary (Signed)
OB Discharge Summary     Patient Name: Anne Richardson DOB: 08/15/1986 MRN: [id number]  Date of admission: 03/20/2016 Delivering MD:    Claudius SisStimpson, [name] [place] [[id number]][[id number]]  Alyson InglesURE, LUTHER H   Fly, BoyB [place] [[id number]][[id number]]  Duane LopeEURE, LUTHER H    Date of discharge: 03/24/2016  Admitting diagnosis: 35 WEEKS CTX Intrauterine pregnancy: 4272w5d     Secondary diagnosis:  Active Problems:   Fetal abnormality in antepartum pregnancy   Previous cesarean section  Additional problems: none     Discharge diagnosis: Preterm Pregnancy Delivered                                                                                                Post partum procedures:none  Augmentation: none  Complications: None  Hospital course:  Onset of Labor With Unplanned C/S  30 y.o. yo [id number] at 4172w5d was admitted in Latent Labor on 03/20/2016. Patient had a labor course significant for non-reassuring fetal status Twin A 4/5 BPP and Twin B 6/8.  Twin B also had decels.  Proceeded with c-section. Membrane Rupture Time/Date:    Lenward ChancellorStimpson, [name] [place] [[id number]][[id number]]  11:29 AM   Derrick RavelStimpson, BoyB [place] [[id number]][[id number]]  11:30 AM ,   Claudius SisStimpson, [name] [place] [[id number]][[id number]]  03/21/2016   Derrick RavelStimpson, BoyB [place] [[id number]][[id number]]  03/21/2016   The patient went for cesarean section due to Non-Reassuring FHR, and delivered two Viable infants,    Lenward ChancellorStimpson, [name] Kyrra [[id number]][[id number]]  03/21/2016   Derrick RavelStimpson, BoyB [place] [[id number]][[id number]]  03/21/2016  Details of operation can be found in separate operative note. Patient had an uncomplicated postpartum course.  She is ambulating,tolerating a regular diet, passing flatus, and urinating well.  Patient is discharged home in stable condition 03/24/2016.  Physical exam  Filed Vitals:   03/22/16 1902 03/23/16 0611 03/23/16 1830 03/24/16 0510  BP: 131/81 122/59 134/69 135/71  Pulse: 98 106 129 106  Temp: 98.1 F (36.7 C) 98 F (36.7 C) 98 F (36.7 C) 98.4 F (36.9 C)  TempSrc:  Oral Oral Oral Oral  Resp: 18 18 18 18   Height:      Weight:      SpO2:    98%   General: alert, cooperative and no distress Lochia: appropriate Uterine Fundus: firm Incision: Healing well with no significant drainage, No significant erythema, Dressing is clean, dry, and intact DVT Evaluation: No evidence of DVT seen on physical exam. Negative Homan's sign. No cords or calf tenderness. No significant calf/ankle edema. Labs: Lab Results  Component Value Date   WBC 18.7* 03/23/2016   HGB 9.2* 03/23/2016   HCT 28.4* 03/23/2016   MCV 80.5 03/23/2016   PLT 294 03/23/2016   CMP Latest Ref Rng 11/24/2015  Glucose 65 - 99 mg/dL 81  BUN 6 - 20 mg/dL 8  Creatinine 0.630.44 - 0.161.00 mg/dL 0.100.65  Sodium 932135 - 355145 mmol/L 134(L)  Potassium 3.5 - 5.1 mmol/L 3.4(L)  Chloride 101 - 111 mmol/L 104  CO2 22 - 32 mmol/L 25  Calcium 8.9 - 10.3 mg/dL 9.2  Total Protein 6.5 - 8.1 g/dL 6.4(L)  Total Bilirubin 0.3 - 1.2  mg/dL 1.6(X)  Alkaline Phos 38 - 126 U/L 57  AST 15 - 41 U/L 13(L)  ALT 14 - 54 U/L 10(L)    Discharge instruction: per After Visit Summary and "Baby and Me Booklet".  After visit meds:    Medication List    STOP taking these medications        fluconazole 150 MG tablet  Commonly known as:  DIFLUCAN      TAKE these medications        cyclobenzaprine 5 MG tablet  Commonly known as:  FLEXERIL  Take 2 tablets (10 mg total) by mouth 3 (three) times daily as needed for muscle spasms.     ibuprofen 600 MG tablet  Commonly known as:  ADVIL,MOTRIN  Take 1 tablet (600 mg total) by mouth every 6 (six) hours as needed for mild pain.     metroNIDAZOLE 500 MG tablet  Commonly known as:  FLAGYL  Take 1 tablet (500 mg total) by mouth 2 (two) times daily.     oxyCODONE-acetaminophen 5-325 MG tablet  Commonly known as:  PERCOCET/ROXICET  Take 1-2 tablets by mouth every 4 (four) hours as needed for moderate pain or severe pain.     polyethylene glycol packet  Commonly known as:   MIRALAX  Take 17 g by mouth daily.     PRENATAL VITAMINS PLUS 27-1 MG Tabs  Take 1 tablet by mouth daily. Generic is OK - Prenatal vitamin with 1 mg Folic acid        Diet: routine diet  Activity: Advance as tolerated. Pelvic rest for 6 weeks.   Outpatient follow up:6 weeks Follow up Appt:No future appointments. Follow up Visit:No Follow-up on file.  Postpartum contraception: Nexplanon  Newborn Data:   Keirra, Zeimet [[id number]]  Live born female  Birth Weight: 5 lb 15.2 oz (2700 g) APGAR: 8, 9   Vicke, Plotner [[id number]]  Live born female  Birth Weight: 4 lb 14.7 oz (2230 g) APGAR: 8, 9  Baby Feeding: Bottle and Breast Disposition:One child will go home with mother. the other remains in NICU   03/24/2016 Delynn Flavin, DO

## 2016-03-25 ENCOUNTER — Ambulatory Visit (HOSPITAL_COMMUNITY): Payer: Self-pay

## 2016-03-27 ENCOUNTER — Ambulatory Visit: Payer: Self-pay | Admitting: *Deleted

## 2016-03-27 ENCOUNTER — Other Ambulatory Visit: Payer: Self-pay

## 2016-03-27 VITALS — BP 129/73 | HR 84 | Temp 98.3°F | Ht 65.0 in | Wt 282.1 lb

## 2016-03-27 DIAGNOSIS — Z4802 Encounter for removal of sutures: Secondary | ICD-10-CM

## 2016-03-27 NOTE — Progress Notes (Signed)
Here for staple removal.  C/o a lot of pain, almost out of percocet.C/o constipation- usually 1 bm per day, only 1 bm since had c/s 03/21/16.  Encouraged patient to ambulate 4-5 times a day at least, force fluids, fruits, vegetables, prunes or prune juice. Has miralax- encouraged her to use miralax bid prn.  Incision clean, intact with wetness noted- patient states she didn't dry off after shower. Instructed patient to shower daily and to dry would with clean towel. Also advised her since she had a pannus to dry area with clean tissue and clean hands everytime she goes to bathroom if area is sweaty. Removed every other staple starting on right side until middle of incision.- then noted scant to moderate serous/bloody drainage. Stopped and had Dr. Adrian BlackwaterStinson to come in and examine incision. Orders to take out every other staple, place steristrips and have patient come back 03/30/16 for removal of the rest of the staples. Also dicussed with patient to use ibuprofen, miralax bid until regular bowel movements.  Patient voices understanding.

## 2016-03-30 ENCOUNTER — Ambulatory Visit: Payer: Self-pay

## 2016-03-30 VITALS — Wt 282.0 lb

## 2016-03-30 DIAGNOSIS — Z4802 Encounter for removal of sutures: Secondary | ICD-10-CM

## 2016-03-30 MED ORDER — SULFAMETHOXAZOLE-TRIMETHOPRIM 800-160 MG PO TABS: 1.0000 | ORAL_TABLET | Freq: Two times a day (BID) | ORAL | Status: DC

## 2016-03-30 NOTE — Progress Notes (Signed)
Pt presents to clinic today for wound check. Staples have been removed. Dr.Anyanmu has prescribed patient some Bactrim to help with infection. Pt verbalized understanding and will pick up medication today.

## 2016-03-31 ENCOUNTER — Other Ambulatory Visit: Payer: Self-pay

## 2016-04-03 ENCOUNTER — Other Ambulatory Visit: Payer: Self-pay

## 2016-04-06 ENCOUNTER — Ambulatory Visit: Payer: Self-pay

## 2016-04-07 ENCOUNTER — Ambulatory Visit: Payer: Self-pay

## 2016-04-07 VITALS — BP 124/72 | HR 91

## 2016-04-07 DIAGNOSIS — Z5189 Encounter for other specified aftercare: Secondary | ICD-10-CM

## 2016-04-07 NOTE — Progress Notes (Signed)
Pt presents today for a wound check. She was started on Bactrim DS a week ago for infection to her c-section site. Per Lawson FiscalLori site looks to be healing to well and patient will continue to medication as instructed. Pt will follow up in six weeks for her postpartum check up.

## 2016-04-23 ENCOUNTER — Ambulatory Visit: Payer: Self-pay | Admitting: Obstetrics & Gynecology

## 2016-04-24 ENCOUNTER — Ambulatory Visit: Payer: Self-pay | Admitting: Family Medicine

## 2016-05-11 ENCOUNTER — Telehealth: Payer: Self-pay | Admitting: Obstetrics & Gynecology

## 2016-05-11 NOTE — Telephone Encounter (Signed)
Want to know if she can get a Note to return back to work on This Wednesday her postpartum appt is on Friday

## 2016-05-13 NOTE — Telephone Encounter (Signed)
Called patient, no answer- unable to leave message due to voicemail box full. Per chart review, patient's appt on Friday was cancelled and moved to a later date. Patient needs to be informed.

## 2016-05-14 NOTE — Telephone Encounter (Signed)
Called patient, no answer- left message stating we are trying to reach you to return your phone call, please call us back at the clinics if you still need assistance 

## 2016-05-15 ENCOUNTER — Encounter: Payer: Self-pay | Admitting: Obstetrics and Gynecology

## 2016-05-15 ENCOUNTER — Ambulatory Visit: Payer: Self-pay | Admitting: Obstetrics & Gynecology

## 2016-06-04 ENCOUNTER — Ambulatory Visit (INDEPENDENT_AMBULATORY_CARE_PROVIDER_SITE_OTHER): Payer: Medicaid Other | Admitting: Medical

## 2016-06-04 ENCOUNTER — Ambulatory Visit (INDEPENDENT_AMBULATORY_CARE_PROVIDER_SITE_OTHER): Payer: Self-pay | Admitting: Clinical

## 2016-06-04 ENCOUNTER — Encounter: Payer: Self-pay | Admitting: Medical

## 2016-06-04 DIAGNOSIS — F4323 Adjustment disorder with mixed anxiety and depressed mood: Secondary | ICD-10-CM

## 2016-06-04 DIAGNOSIS — Z98891 History of uterine scar from previous surgery: Secondary | ICD-10-CM | POA: Insufficient documentation

## 2016-06-04 LAB — POCT PREGNANCY, URINE: PREG TEST UR: NEGATIVE

## 2016-06-04 NOTE — Progress Notes (Signed)
  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 [facility]  Pt would benefit from psychoeducation and brief therapeutic interventions regarding coping with symptoms of anxiety and depression.  Pt may benefit from community resources and social support.  Stage of Change: contemplative  PLAN: 1. F/U with behavioral health clinician in two weeks 2. Psychiatric Medications: none  3. Behavioral recommendations:   -Go to family cookout tomorrow, 06-05-16 -Daily relaxation breathing exercises, as practiced in office visit, at least 2-5 minutes/day, as needed -Continue to keep magazines at home to read, as distraction technique -Go [place] to obtain reduced-fare ID card (call first to confirm hours, bring Medicaid card and state ID) -Consider Feelings [facility] Birth support group, [facility] T[facility] 10[facility] -Consider crisis line numbers, as needed  5. From scale of 1-10, how likely are you to follow plan:8  SUBJECTIVE: Pt. referred by Vonzella NippleJulie Wenzel, PA-C for possible homicidal ideation  Pt. reports the following symptoms/concerns: Pt states that she is not homicidal today, and that she has not felt homicidal previously, and no plan. Pt admits that she has been in relationship with twins' father for about 2 years, and that she is merely frustrated by his lack of practical support of the twins. When he is difficult, she copes by distracting herself from the stress by reading magazines, and that if she tells him to leave her house, he will go home. Pt does not admit to any violence in the relationship. Pt was prescribed medication for depression after birth of 32yo, but says she did not pick up, and did not take. Pt primary concern is transportation and is interested in learning new ways to cope with stress; has good extended family support. Duration of problem: Over 2 months Severity:  mild-moderate   OBJECTIVE: Orientation & Cognition: Oriented x3. Thought processes normal and appropriate to situation. Mood: appropriate Affect: appropriate Appearance: appropriate Risk of harm to self or others: no known risk of harm to self or others (see above) Substance use: none Assessments administered: GAD7: 6/ [place]: 9  Diagnosis: Adjustment disorder with mixed anxiety and depressed mood CPT Code: F32.23  -------------------------------------------- Other(s) present in the room: none  Time spent with patient in exam room: 30 minutes 4:00-4:30pm  GAD 7 : Generalized Anxiety Score 06/04/2016  Nervous, Anxious, on Edge 1  Control/stop worrying 1  Worry too much - different things 2  Trouble relaxing 1  Restless 0  Easily annoyed or irritable 1  Afraid - awful might happen 0  Total GAD 7 Score 6  Anxiety Difficulty Not difficult at all

## 2016-06-04 NOTE — Progress Notes (Signed)
Patient ID: Keniyah J Holroyd, female   DOB: 04/29/1986, 30 y.o.   MRN: [id number] Subjective:     Ranell J Choquette is a 30 y.o. female who presents for a postpartum visit. She is several weeks postpartum following a low cervical transverse Cesarean section. I have fully reviewed the prenatal and intrapartum course. The delivery was at 35 gestational weeks. Outcome: repeat cesarean section, low transverse incision. Anesthesia: spinal. Postpartum course has been uncompicated. Babies' course has been complicated by prematurity d/t twin pregnancy/NICU stay for one baby. Baby is feeding by bottle - Similac Neosure. Bleeding no bleeding. Bowel function is normal. Bladder function is abnormal: urgency/urge incontinence occasionally. Patient is sexually active. Contraception method is none. Postpartum depression screening: positive.  The following portions of the patient's history were reviewed and updated as appropriate: allergies, current medications, past family history, past medical history, past social history, past surgical history and problem list.  Review of Systems Pertinent items are noted in HPI.   Objective:    BP 128/90 mmHg  Pulse 89  Ht 5' 4" (1.626 m)  Wt 238 lb 11.2 oz (108.274 kg)  BMI 40.95 kg/m2  LMP 04/30/2016  Breastfeeding? No  General:  alert and cooperative   Breasts:  not performed  Lungs:  Normal effort  Heart:  normal rate  Abdomen: soft, non-tender. incision well-healed   Vulva:  not evaluated  Vagina: not evaluated  Cervix:  not evaluated  Corpus: not examined  Adnexa:  not evaluated  Rectal Exam: Not performed.        Assessment:     Abnromal postpartum exam, complicated by positive PP depression screen, patient met with Behavioral Health Specialist today.  Pap smear not done at today's visit.  Last pap 11/2015 - normal  Plan:    1. Contraception: abstinence and condoms, until > 2 weeks from unprotected intercourse, then desires Nexplanon or IUD  insertion 2. Follow up in: 1 -2 weeks for birth control initiation or sooner as needed.     N , PA-C  06/04/2016 3:30 PM  

## 2016-06-04 NOTE — Patient Instructions (Addendum)
Etonogestrel implant What is this medicine? ETONOGESTREL (et oh noe JES trel) is a contraceptive (birth control) device. It is used to prevent pregnancy. It can be used for up to 3 years. This medicine may be used for other purposes; ask your health care provider or pharmacist if you have questions. What should I tell my health care provider before I take this medicine? They need to know if you have any of these conditions: -abnormal vaginal bleeding -blood vessel disease or blood clots -cancer of the breast, cervix, or liver -depression -diabetes -gallbladder disease -headaches -heart disease or recent heart attack -high blood pressure -high cholesterol -kidney disease -liver disease -renal disease -seizures -tobacco smoker -an unusual or allergic reaction to etonogestrel, other hormones, anesthetics or antiseptics, medicines, foods, dyes, or preservatives -pregnant or trying to get pregnant -breast-feeding How should I use this medicine? This device is inserted just under the skin on the inner side of your upper arm by a health care professional. Talk to your pediatrician regarding the use of this medicine in children. Special care may be needed. Overdosage: If you think you have taken too much of this medicine contact a poison control center or emergency room at once. NOTE: This medicine is only for you. Do not share this medicine with others. What if I miss a dose? This does not apply. What may interact with this medicine? Do not take this medicine with any of the following medications: -amprenavir -bosentan -fosamprenavir This medicine may also interact with the following medications: -barbiturate medicines for inducing sleep or treating seizures -certain medicines for fungal infections like ketoconazole and itraconazole -griseofulvin -medicines to treat seizures like carbamazepine, felbamate, oxcarbazepine, phenytoin,  topiramate -modafinil -phenylbutazone -rifampin -some medicines to treat HIV infection like atazanavir, indinavir, lopinavir, nelfinavir, tipranavir, ritonavir -St. John's wort This list may not describe all possible interactions. Give your health care provider a list of all the medicines, herbs, non-prescription drugs, or dietary supplements you use. Also tell them if you smoke, drink alcohol, or use illegal drugs. Some items may interact with your medicine. What should I watch for while using this medicine? This product does not protect you against HIV infection (AIDS) or other sexually transmitted diseases. You should be able to feel the implant by pressing your fingertips over the skin where it was inserted. Contact your doctor if you cannot feel the implant, and use a non-hormonal birth control method (such as condoms) until your doctor confirms that the implant is in place. If you feel that the implant may have broken or become bent while in your arm, contact your healthcare provider. What side effects may I notice from receiving this medicine? Side effects that you should report to your doctor or health care professional as soon as possible: -allergic reactions like skin rash, itching or hives, swelling of the face, lips, or tongue -breast lumps -changes in emotions or moods -depressed mood -heavy or prolonged menstrual bleeding -pain, irritation, swelling, or bruising at the insertion site -scar at site of insertion -signs of infection at the insertion site such as fever, and skin redness, pain or discharge -signs of pregnancy -signs and symptoms of a blood clot such as breathing problems; changes in vision; chest pain; severe, sudden headache; pain, swelling, warmth in the leg; trouble speaking; sudden numbness or weakness of the face, arm or leg -signs and symptoms of liver injury like dark yellow or brown urine; general ill feeling or flu-like symptoms; light-colored stools; loss of  appetite; nausea; right upper belly   pain; unusually weak or tired; yellowing of the eyes or skin -unusual vaginal bleeding, discharge -signs and symptoms of a stroke like changes in vision; confusion; trouble speaking or understanding; severe headaches; sudden numbness or weakness of the face, arm or leg; trouble walking; dizziness; loss of balance or coordination Side effects that usually do not require medical attention (Report these to your doctor or health care professional if they continue or are bothersome.): -acne -back pain -breast pain -changes in weight -dizziness -general ill feeling or flu-like symptoms -headache -irregular menstrual bleeding -nausea -sore throat -vaginal irritation or inflammation This list may not describe all possible side effects. Call your doctor for medical advice about side effects. You may report side effects to FDA at 1-800-FDA-1088. Where should I keep my medicine? This drug is given in a hospital or clinic and will not be stored at home. NOTE: This sheet is a summary. It may not cover all possible information. If you have questions about this medicine, talk to your doctor, pharmacist, or health care provider.    2016, Elsevier/Gold Standard. (2014-09-21 14:07:06) Contraception Choices Birth control (contraception) is the use of any methods or devices to stop pregnancy from happening. Below are some methods to help avoid pregnancy. HORMONAL BIRTH CONTROL  A small tube put under the skin of the upper arm (implant). The tube can stay in place for 3 years. The implant must be taken out after 3 years.  Shots given every 3 months.  Pills taken every day.  Patches that are changed once a week.  A ring put into the vagina (vaginal ring). The ring is left in place for 3 weeks and removed for 1 week. Then, a new ring is put in the vagina.  Emergency birth control pills taken after unprotected sex (intercourse). BARRIER BIRTH CONTROL   A thin  covering worn on the penis (female condom) during sex.  A soft, loose covering put into the vagina (female condom) before sex.  A rubber bowl that sits over the cervix (diaphragm). The bowl must be made for you. The bowl is put into the vagina before sex. The bowl is left in place for 6 to 8 hours after sex.  A small, soft cup that fits over the cervix (cervical cap). The cup must be made for you. The cup can be left in place for 48 hours after sex.  A sponge that is put into the vagina before sex.  A chemical that kills or stops sperm from getting into the cervix and uterus (spermicide). The chemical may be a cream, jelly, foam, or pill. INTRAUTERINE (IUD) BIRTH CONTROL   IUD birth control is a small, T-shaped piece of plastic. The plastic is put inside the uterus. There are 2 types of IUD:  Copper IUD. The IUD is covered in copper wire. The copper makes a fluid that kills sperm. It can stay in place for 10 years.  Hormone IUD. The hormone stops pregnancy from happening. It can stay in place for 5 years. PERMANENT METHODS  When the woman has her fallopian tubes sealed, tied, or blocked during surgery. This stops the egg from traveling to the uterus.  The doctor places a small coil or insert into each fallopian tube. This causes scar tissue to form and blocks the fallopian tubes.  When the female has the tubes that carry sperm tied off (vasectomy). NATURAL FAMILY PLANNING BIRTH CONTROL   Natural family planning means not having sex or using barrier birth control on the days  the woman could become pregnant.  Use a calendar to keep track of the length of each period and know the days she can get pregnant.  Avoid sex during ovulation.  Use a thermometer to measure body temperature. Also watch for symptoms of ovulation.  Time sex to be after the woman has ovulated. Use condoms to help protect yourself against sexually transmitted infections (STIs). Do this no matter what type of birth  control you use. Talk to your doctor about which type of birth control is best for you.   This information is not intended to replace advice given to you by your health care provider. Make sure you discuss any questions you have with your health care provider.   Document Released: 10/04/2009 Document Revised: 12/12/2013 Document Reviewed: 06/28/2013 Elsevier Interactive Patient Education 2016 [employer]. Intrauterine Device Information An intrauterine device (IUD) is inserted into your uterus to prevent pregnancy. There are two types of IUDs available:   Copper IUD--This type of IUD is wrapped in copper wire and is placed inside the uterus. Copper makes the uterus and fallopian tubes produce a fluid that kills sperm. The copper IUD can stay in place for 10 years.  Hormone IUD--This type of IUD contains the hormone progestin (synthetic progesterone). The hormone thickens the cervical mucus and prevents sperm from entering the uterus. It also thins the uterine lining to prevent implantation of a fertilized egg. The hormone can weaken or kill the sperm that get into the uterus. One type of hormone IUD can stay in place for 5 years, and another type can stay in place for 3 years. Your health care provider will make sure you are a good candidate for a contraceptive IUD. Discuss with your health care provider the possible side effects.  ADVANTAGES OF AN INTRAUTERINE DEVICE  IUDs are highly effective, reversible, long acting, and low maintenance.   There are no estrogen-related side effects.   An IUD can be used when breastfeeding.   IUDs are not associated with weight gain.   The copper IUD works immediately after insertion.   The hormone IUD works right away if inserted within 7 days of your period starting. You will need to use a backup method of birth control for 7 days if the hormone IUD is inserted at any other time in your cycle.  The copper IUD does not interfere with your female  hormones.   The hormone IUD can make heavy menstrual periods lighter and decrease cramping.   The hormone IUD can be used for 3 or 5 years.   The copper IUD can be used for 10 years. DISADVANTAGES OF AN INTRAUTERINE DEVICE  The hormone IUD can be associated with irregular bleeding patterns.   The copper IUD can make your menstrual flow heavier and more painful.   You may experience cramping and vaginal bleeding after insertion.    This information is not intended to replace advice given to you by your health care provider. Make sure you discuss any questions you have with your health care provider.   Document Released: 11/10/2004 Document Revised: 08/09/2013 Document Reviewed: 05/28/2013 Elsevier Interactive Patient Education [employer]

## 2016-06-25 ENCOUNTER — Ambulatory Visit: Payer: Medicaid Other | Admitting: Family Medicine

## 2016-10-30 ENCOUNTER — Emergency Department (HOSPITAL_COMMUNITY)
Admission: EM | Admit: 2016-10-30 | Discharge: 2016-10-30 | Disposition: A | Discharge status: Home / Self Care | Payer: Medicaid Other | Attending: Emergency Medicine | Admitting: Emergency Medicine

## 2016-10-30 ENCOUNTER — Encounter (HOSPITAL_COMMUNITY): Payer: Self-pay | Admitting: Emergency Medicine

## 2016-10-30 DIAGNOSIS — Z79899 Other long term (current) drug therapy: Secondary | ICD-10-CM | POA: Insufficient documentation

## 2016-10-30 DIAGNOSIS — K047 Periapical abscess without sinus: Secondary | ICD-10-CM

## 2016-10-30 DIAGNOSIS — Z791 Long term (current) use of non-steroidal anti-inflammatories (NSAID): Secondary | ICD-10-CM | POA: Insufficient documentation

## 2016-10-30 DIAGNOSIS — Z792 Long term (current) use of antibiotics: Secondary | ICD-10-CM | POA: Insufficient documentation

## 2016-10-30 DIAGNOSIS — K029 Dental caries, unspecified: Secondary | ICD-10-CM | POA: Insufficient documentation

## 2016-10-30 MED ORDER — NAPROXEN 500 MG PO TABS: 500.0000 mg | ORAL_TABLET | Freq: Two times a day (BID) | ORAL | 0 refills | Status: DC | PRN

## 2016-10-30 MED ORDER — DOXYCYCLINE HYCLATE 100 MG PO CAPS: 100.0000 mg | ORAL_CAPSULE | Freq: Two times a day (BID) | ORAL | 0 refills | Status: DC

## 2016-10-30 NOTE — ED Triage Notes (Signed)
Per pt, states upper right back molar pain on and off for months-increased pain last 2 days

## 2016-10-30 NOTE — ED Provider Notes (Signed)
[facility]-EMERGENCY DEPT Provider Note   CSN: [id number] Arrival date & time: 10/30/16  [id number]     History   Chief Complaint Chief Complaint  Patient presents with  . Dental Pain    HPI Anne Richardson is a 30 y.o. female who presents to the ED with complaints of gradually worsening right upper dental pain that has been intermittent for several months but worse over the last 3 days. Patient states that she has a hole in her tooth, unsure if the filling came out or if it's just hole in the enamel. She describes the pain as 10/10 constant sharp and throbbing right upper molar pain radiating into the right side of her face and temple, worse with cold air, anything touching that area, or chewing on that side, and moderately improved with ibuprofen. Associated symptoms include gum swelling. She does not currently have a dentist. She is a nonsmoker.  She denies any fevers, chills, drooling, trismus, gum drainage, neck or face swelling, rhinorrhea, ear pain or drainage, chest pain, shortness breath, abdominal pain, nausea, vomiting, diarrhea, constipation, dysuria, hematuria, numbness, tingling, or focal weakness. Doesn't currently have insurance; needs rx's off the $4 list if possible.    The history is provided by the patient and medical records. No language interpreter was used.  Dental Pain   This is a recurrent problem. The current episode started more than 1 week ago. The problem occurs constantly. The problem has been gradually worsening. The pain is at a severity of 10/10. The pain is severe. Treatments tried: ibuprofen. The treatment provided moderate relief.    Past Medical History:  Diagnosis Date  . Pregnancy induced hypertension     Patient Active Problem List   Diagnosis Date Noted  . Previous cesarean section 03/21/2016    Past Surgical History:  Procedure Laterality Date  . BIOPSY BREAST    . BREAST LUMPECTOMY     left  . CESAREAN SECTION    . CESAREAN SECTION   06/28/2012   Procedure: CESAREAN SECTION;  Surgeon: Lesly DukesKelly H Leggett, MD;  Location: [facility];  Service: Gynecology;  Laterality: N/A;  . CESAREAN SECTION N/A 03/21/2016   Procedure: CESAREAN SECTION;  Surgeon: Lazaro ArmsLuther H Eure, MD;  Location: [facility];  Service: Obstetrics;  Laterality: N/A;  . WISDOM TOOTH EXTRACTION     age 30    OB History    Gravida Para Term Preterm AB Living   5 3 2 1 2 4    SAB TAB Ectopic Multiple Live Births   2 0   1 4       Home Medications    Prior to Admission medications   Medication Sig Start Date End Date Taking? Authorizing Provider  cyclobenzaprine (FLEXERIL) 5 MG tablet Take 2 tablets (10 mg total) by mouth 3 (three) times daily as needed for muscle spasms. Patient not taking: Reported on 06/04/2016 02/20/16   Dorathy KinsmanVirginia Smith, CNM  ibuprofen (ADVIL,MOTRIN) 600 MG tablet Take 1 tablet (600 mg total) by mouth every 6 (six) hours as needed for mild pain. Patient not taking: Reported on 06/04/2016 03/24/16   Raliegh IpAshly M Gottschalk, DO  metroNIDAZOLE (FLAGYL) 500 MG tablet Take 500 mg by mouth 2 (two) times daily. Reported on 06/04/2016    Historical Provider, MD  oxyCODONE-acetaminophen (PERCOCET/ROXICET) 5-325 MG tablet Take 1-2 tablets by mouth every 4 (four) hours as needed for moderate pain or severe pain. Patient not taking: Reported on 06/04/2016 03/24/16   Raliegh IpAshly M Gottschalk, DO  polyethylene glycol [facility](MIRALAX)  packet Take 17 g by mouth daily. Patient not taking: Reported on 06/04/2016 03/19/16   Michael J Estonia, MD  Prenatal Vit-Fe Fumarate-FA (PRENATAL VITAMINS PLUS) 27-1 MG TABS Take 1 tablet by mouth daily. Generic is OK - Prenatal vitamin with 1 mg Folic acid Patient not taking: Reported on 03/30/2016 02/19/16   Federico Flake, MD  sulfamethoxazole-trimethoprim (BACTRIM DS,SEPTRA DS) 800-160 MG tablet Take 1 tablet by mouth 2 (two) times daily. Patient not taking: Reported on 06/04/2016 03/30/16   Tereso Newcomer, MD    Family History Family History  Problem  Relation Age of Onset  . Cancer Mother 30    breast  . Alcohol abuse Mother   . Depression Mother   . Hypertension Mother   . Depression Sister   . Hypertension Sister   . Asthma Brother   . Depression Brother   . Hypertension Brother   . Alcohol abuse Maternal Aunt   . Hypertension Maternal Aunt   . Cancer Maternal Aunt 65    breast ca  . Alcohol abuse Maternal Uncle   . Hypertension Maternal Uncle   . Alzheimer's disease Maternal Grandmother   . Heart disease Maternal Grandmother   . Alcohol abuse Maternal Grandmother   . Hypertension Maternal Grandmother   . Heart disease Maternal Grandfather   . Alcohol abuse Maternal Grandfather   . Hypertension Maternal Grandfather   . Cancer Maternal Grandfather     prostate and bone  . Depression Paternal Grandfather     Social History Social History  Substance Use Topics  . Smoking status: Never Smoker  . Smokeless tobacco: Never Used  . Alcohol use No     Allergies   Penicillins   Review of Systems Review of Systems  Constitutional: Negative for chills and fever.  HENT: Positive for dental problem. Negative for drooling, ear discharge, ear pain, facial swelling, rhinorrhea, sore throat and trouble swallowing.   Respiratory: Negative for shortness of breath.   Cardiovascular: Negative for chest pain.  Gastrointestinal: Negative for abdominal pain, constipation, diarrhea, nausea and vomiting.  Genitourinary: Negative for dysuria and hematuria.  Musculoskeletal: Negative for arthralgias, myalgias and neck pain.  Skin: Negative for color change.  Allergic/Immunologic: Negative for immunocompromised state.  Neurological: Negative for weakness and numbness.  Psychiatric/Behavioral: Negative for confusion.   10 Systems reviewed and are negative for acute change except as noted in the HPI.   Physical Exam Updated Vital Signs BP 128/77 (BP Location: Left Arm)   Pulse 70   Temp 98 F (36.7 C) (Oral)   Resp 18   SpO2  100%   Physical Exam  Constitutional: She is oriented to person, place, and time. Vital signs are normal. She appears well-developed and well-nourished.  Non-toxic appearance. No distress.  Afebrile, nontoxic, NAD  HENT:  Head: Normocephalic and atraumatic.  Nose: Nose normal.  Mouth/Throat: Uvula is midline, oropharynx is clear and moist and mucous membranes are normal. No trismus in the jaw. Dental caries present. No dental abscesses or uvula swelling. Tonsils are 0 on the right. Tonsils are 0 on the left. No tonsillar exudate.    R upper molar #2 with filling intact, posterior aspect of tooth decayed and missing a fragment of tooth, moderately TTP, with no surrounding gingival erythema or swelling, no abscess, no evidence of ludwig's. Nose clear. Oropharynx clear and moist, without uvular swelling or deviation, no trismus or drooling, no tonsillar swelling or erythema, no exudates.    Eyes: Conjunctivae and EOM are normal. Right  eye exhibits no discharge. Left eye exhibits no discharge.  Neck: Normal range of motion. Neck supple.  Cardiovascular: Normal rate and intact distal pulses.   Pulmonary/Chest: Effort normal. No respiratory distress.  Abdominal: Normal appearance. She exhibits no distension.  Musculoskeletal: Normal range of motion.  Lymphadenopathy:       Head (right side): No submandibular and no tonsillar adenopathy present.       Head (left side): No submandibular and no tonsillar adenopathy present.    She has no cervical adenopathy.  No head/neck LAD  Neurological: She is alert and oriented to person, place, and time. She has normal strength. No sensory deficit.  Skin: Skin is warm, dry and intact. No rash noted.  Psychiatric: She has a normal mood and affect. Her behavior is normal.  Nursing note and vitals reviewed.    ED Treatments / Results  Labs (all labs ordered are listed, but only abnormal results are displayed) Labs Reviewed - No data to display  EKG  EKG  Interpretation None       Radiology No results found.  Procedures Procedures (including critical care time)  Medications Ordered in ED Medications - No data to display   Initial Impression / Assessment and Plan / ED Course  I have reviewed the triage vital signs and the nursing notes.  Pertinent labs & imaging results that were available during my care of the patient were reviewed by me and considered in my medical decision making (see chart for details).  Clinical Course     30 y.o. female here with Dental pain associated with dental decay and possible dental infection although no dental abscess, with patient afebrile, non toxic appearing and swallowing secretions well, no evidence of ludwig's. I gave patient referral to dentist and stressed the importance of dental follow up for ultimate management of dental pain.  I have also discussed reasons to return immediately to the ER.  Patient expresses understanding and agrees with plan.  I will also give doxycycline and discussed naprosyn/tylenol/heat compress use.  Final Clinical Impressions(s) / ED Diagnoses   Final diagnoses:  Pain due to dental caries  Dental decay  Dental infection    New Prescriptions New Prescriptions   DOXYCYCLINE (VIBRAMYCIN) 100 MG CAPSULE    Take 1 capsule (100 mg total) by mouth 2 (two) times daily. One po bid x 7 days   NAPROXEN (NAPROSYN) 500 MG TABLET    Take 1 tablet (500 mg total) by mouth 2 (two) times daily as needed for mild pain, moderate pain or headache (TAKE WITH MEALS.).     Inna Tisdell Camprubi-Soms, PA-C 10/30/16 0945    Lorre NickAnthony Allen, MD 11/01/16 [phone]

## 2016-10-30 NOTE — Discharge Instructions (Signed)
Apply warm compresses to jaw throughout the day. Take antibiotic until finished. Use tylenol and  naprosyn as directed as needed for pain. Perform salt water swishes to help with pain/swelling. Use over the counter oragel as needed for additional relief. Followup with a dentist is very important for ongoing evaluation and management of recurrent dental pain, call the dentist listed above in the next 24-48 hours to schedule ongoing dental care, or use the list below to find a dentist. Return to emergency department for emergent changing or worsening symptoms.    Emergency Department Resource Guide 1) Find a Doctor and Pay Out of Pocket Although you won't have to find out who is covered by your insurance plan, it is a good idea to ask around and get recommendations. You will then need to call the office and see if the doctor you have chosen will accept you as a new patient and what types of options they offer for patients who are self-pay. Some doctors offer discounts or will set up payment plans for their patients who do not have insurance, but you will need to ask so you aren't surprised when you get to your appointment.  2) Contact Your Local Health Department Not all health departments have doctors that can see patients for sick visits, but many do, so it is worth a call to see if yours does. If you don't know where your local health department is, you can check in your phone book. The CDC also has a tool to help you locate your state's health department, and many state websites also have listings of all of their local health departments.  3) Find a Walk-in Clinic If your illness is not likely to be very severe or complicated, you may want to try a walk in clinic. These are popping up all over the country in pharmacies, drugstores, and shopping centers. They're usually staffed by nurse practitioners or physician assistants that have been trained to treat common illnesses and complaints. They're usually  fairly quick and inexpensive. However, if you have serious medical issues or chronic medical problems, these are probably not your best option.  No Primary Care Doctor: Call Health Connect at  [phone] - they can help you locate a primary care doctor that  accepts your insurance, provides certain services, etc. Physician Referral Service- [phone]  Chronic Pain Problems: Organization         Address  Phone   Notes  [facility]  [phone] Patients need to be referred by their primary care doctor.   Medication Assistance: Organization         Address  Phone   Notes  [facility] [address] [place], [place] [address] [phone] --Must be a resident of [place] -- Must have NO insurance coverage whatsoever (no Medicaid/ Medicare, etc.) -- The pt. MUST have a primary care doctor that directs their care regularly and follows them in the community   [facility]  [phone]   [employer]  [phone]     Dental Care: Organization         Address  Phone  Notes  [facility] [address], [place] [phone] Accepts children up to age 32 who are enrolled in [place] or [place] Health Choice; pregnant women with a Medicaid card; and children who have applied for Medicaid or  Health Choice, but were declined, whose parents can pay  a reduced fee at time of service.  [facility] [place]  [address], [place] [phone] Accepts children up to age 32 who are enrolled in [place] or [place] Health Choice; pregnant women with a Medicaid card; and children who have applied for Medicaid or [place] Health Choice, but were declined, whose parents can pay a reduced fee at time of service.  [facility]  [address], [place] [phone] Patients are seen by appointment only.  Walk-ins are not accepted. [facility] will see patients 32 years of age and older. Monday - Tuesday (8am-5pm) Most Wednesdays (8:30-5pm) $30 per visit, cash only  [facility]  [address], [facility] [phone] Patients are seen by appointment only. Walk-ins are not accepted. [facility] will see patients 32 years of age and older. One Wednesday Evening (Monthly: Volunteer Based).  $30 per visit, cash only  [facility]  [phone] for adults; Children under age 804, call Graduate Pediatric Dentistry at [phone]. Children aged 764-14, please call [phone] to request a pediatric application.  Dental services are provided in all areas of dental care including fillings, crowns and bridges, complete and partial dentures, implants, gum treatment, root canals, and extractions. Preventive care is also provided. Treatment is provided to both adults and children. Patients are selected via a lottery and there is often a waiting list.   [facility] [address], [place]  [phone] [email or web address]   [facility] [address], [place], [place] [phone], Ext. [phone] Second and Fourth Thursday of each month, opens at 6:30 AM; Clinic ends at 9 AM.  Patients are seen on a first-come first-served basis, and a limited number are seen during each clinic.   [facility]  [address], [place], [place] [phone]   Eligibility Requirements You must have lived in [place], or [place] counties for at least the last three months.   You cannot be eligible for state or federal sponsored [place]healthcare insurance, including [employer], [place]icaid, or [employer]icare.   You generally cannot be eligible for healthcare insurance through your employer.    How to apply: Eligibility screenings are held every Tuesday and Wednesday afternoon from 1:00 pm until 4:00 pm. You do not need an  appointment for the interview!  [facility] [address], [place], [place] [phone]   [facility]  [phone]   [facility]  [phone]   [facility]  [phone]

## 2016-12-15 ENCOUNTER — Encounter (HOSPITAL_COMMUNITY): Payer: Self-pay | Admitting: *Deleted

## 2016-12-15 ENCOUNTER — Inpatient Hospital Stay (HOSPITAL_COMMUNITY)
Admission: AD | Admit: 2016-12-15 | Discharge: 2016-12-16 | Disposition: A | Discharge status: Home / Self Care | Payer: Medicaid Other | Source: Ambulatory Visit | Attending: Family Medicine | Admitting: Family Medicine

## 2016-12-15 ENCOUNTER — Inpatient Hospital Stay (HOSPITAL_COMMUNITY): Payer: Self-pay

## 2016-12-15 DIAGNOSIS — O210 Mild hyperemesis gravidarum: Secondary | ICD-10-CM | POA: Insufficient documentation

## 2016-12-15 DIAGNOSIS — O219 Vomiting of pregnancy, unspecified: Secondary | ICD-10-CM

## 2016-12-15 DIAGNOSIS — O26899 Other specified pregnancy related conditions, unspecified trimester: Secondary | ICD-10-CM

## 2016-12-15 DIAGNOSIS — Z3A01 Less than 8 weeks gestation of pregnancy: Secondary | ICD-10-CM

## 2016-12-15 DIAGNOSIS — Z880 Allergy status to penicillin: Secondary | ICD-10-CM | POA: Insufficient documentation

## 2016-12-15 DIAGNOSIS — Z3491 Encounter for supervision of normal pregnancy, unspecified, first trimester: Secondary | ICD-10-CM

## 2016-12-15 DIAGNOSIS — R109 Unspecified abdominal pain: Secondary | ICD-10-CM | POA: Insufficient documentation

## 2016-12-15 DIAGNOSIS — O26891 Other specified pregnancy related conditions, first trimester: Secondary | ICD-10-CM | POA: Insufficient documentation

## 2016-12-15 LAB — URINALYSIS, ROUTINE W REFLEX MICROSCOPIC
Bilirubin Urine: NEGATIVE
Glucose, UA: NEGATIVE mg/dL
Hgb urine dipstick: NEGATIVE
Ketones, ur: NEGATIVE mg/dL
LEUKOCYTES UA: NEGATIVE
NITRITE: NEGATIVE
PROTEIN: NEGATIVE mg/dL
Specific Gravity, Urine: 1.011 (ref 1.005–1.030)
pH: 6 (ref 5.0–8.0)

## 2016-12-15 LAB — COMPREHENSIVE METABOLIC PANEL
ALBUMIN: 3.3 g/dL — AB (ref 3.5–5.0)
ALK PHOS: 49 U/L (ref 38–126)
ALT: 7 U/L — ABNORMAL LOW (ref 14–54)
ANION GAP: 5 (ref 5–15)
AST: 14 U/L — AB (ref 15–41)
BILIRUBIN TOTAL: 0.2 mg/dL — AB (ref 0.3–1.2)
BUN: 8 mg/dL (ref 6–20)
CALCIUM: 8.9 mg/dL (ref 8.9–10.3)
CO2: 24 mmol/L (ref 22–32)
Chloride: 105 mmol/L (ref 101–111)
Creatinine, Ser: 0.54 mg/dL (ref 0.44–1.00)
GFR calc Af Amer: >60 mL/min (ref >60)
GFR calc non Af Amer: >60 mL/min (ref >60)
GLUCOSE: 88 mg/dL (ref 65–99)
POTASSIUM: 3.6 mmol/L (ref 3.5–5.1)
SODIUM: 134 mmol/L — AB (ref 135–145)
TOTAL PROTEIN: 6.8 g/dL (ref 6.5–8.1)

## 2016-12-15 LAB — WET PREP, GENITAL
CLUE CELLS WET PREP: NONE SEEN
SPERM: NONE SEEN
TRICH WET PREP: NONE SEEN
Yeast Wet Prep HPF POC: NONE SEEN

## 2016-12-15 LAB — CBC
HEMATOCRIT: 35.3 % — AB (ref 36.0–46.0)
HEMOGLOBIN: 12.2 g/dL (ref 12.0–15.0)
MCH: 29.4 pg (ref 26.0–34.0)
MCHC: 34.6 g/dL (ref 30.0–36.0)
MCV: 85.1 fL (ref 78.0–100.0)
Platelets: 330 10*3/uL (ref 150–400)
RBC: 4.15 MIL/uL (ref 3.87–5.11)
RDW: 14.7 % (ref 11.5–15.5)
WBC: 9.3 10*3/uL (ref 4.0–10.5)

## 2016-12-15 LAB — HCG, QUANTITATIVE, PREGNANCY: hCG, Beta Chain, Quant, S: 102901 m[IU]/mL — ABNORMAL HIGH (ref <5)

## 2016-12-15 LAB — POCT PREGNANCY, URINE: PREG TEST UR: POSITIVE — AB

## 2016-12-15 MED ORDER — LACTATED RINGERS IV BOLUS (SEPSIS)
1000.0000 mL | Freq: Once | INTRAVENOUS | Status: AC
  Administered 2016-12-15: 1000 mL via INTRAVENOUS

## 2016-12-15 MED ORDER — PROMETHAZINE HCL 25 MG/ML IJ SOLN
12.5000 mg | Freq: Once | INTRAMUSCULAR | Status: AC
  Administered 2016-12-15: 12.5 mg via INTRAVENOUS
  Filled 2016-12-15: qty 1

## 2016-12-15 MED ORDER — M.V.I. ADULT IV INJ
Freq: Once | INTRAVENOUS | Status: AC
  Administered 2016-12-15: 22:00:00 via INTRAVENOUS
  Filled 2016-12-15: qty 10

## 2016-12-15 MED ORDER — FAMOTIDINE IN NACL 20-0.9 MG/50ML-% IV SOLN
20.0000 mg | Freq: Once | INTRAVENOUS | Status: AC
  Administered 2016-12-15: 20 mg via INTRAVENOUS
  Filled 2016-12-15: qty 50

## 2016-12-15 MED ORDER — PROMETHAZINE HCL 25 MG PO TABS: 12.5000 mg | ORAL_TABLET | Freq: Four times a day (QID) | ORAL | 0 refills | Status: DC | PRN

## 2016-12-15 NOTE — MAU Provider Note (Signed)
History     CSN: [id number]  Arrival date and time: 12/15/16 1553   First Provider Initiated Contact with Patient 12/15/16 2013      Chief Complaint  Patient presents with  . Emesis  . Abdominal Pain   [id number] @[redacted]w[redacted]d  by LMP here with N/V x1.5 weeks. She is able to tolerate ice and grape soda only. She last tolerated solid food this am. She denies fevers but felt hot and cold today. She denies sick contacts. She also endorses bilateral abdominal pain x3 days. She describes as sharp and intermittent. She used Aleve with no relief. She denies vaginal bleeding but reports malodorous clear/white vaginal discharge. She reports itching.     OB History    Gravida Para Term Preterm AB Living   6 3 2 1 2 4    SAB TAB Ectopic Multiple Live Births   2 0   1 4      Past Medical History:  Diagnosis Date  . Pregnancy induced hypertension     Past Surgical History:  Procedure Laterality Date  . BIOPSY BREAST    . BREAST LUMPECTOMY     left  . CESAREAN SECTION    . CESAREAN SECTION  06/28/2012   Procedure: CESAREAN SECTION;  Surgeon: Lesly Dukes, MD;  Location: [facility];  Service: Gynecology;  Laterality: N/A;  . CESAREAN SECTION N/A 03/21/2016   Procedure: CESAREAN SECTION;  Surgeon: Lazaro Arms, MD;  Location: [facility];  Service: Obstetrics;  Laterality: N/A;  . WISDOM TOOTH EXTRACTION     age 53    Family History  Problem Relation Age of Onset  . Cancer Mother 84    breast  . Alcohol abuse Mother   . Depression Mother   . Hypertension Mother   . Depression Sister   . Hypertension Sister   . Asthma Brother   . Depression Brother   . Hypertension Brother   . Alcohol abuse Maternal Aunt   . Hypertension Maternal Aunt   . Cancer Maternal Aunt 65    breast ca  . Alcohol abuse Maternal Uncle   . Hypertension Maternal Uncle   . Alzheimer's disease Maternal Grandmother   . Heart disease Maternal Grandmother   . Alcohol abuse Maternal Grandmother   . Hypertension Maternal  Grandmother   . Heart disease Maternal Grandfather   . Alcohol abuse Maternal Grandfather   . Hypertension Maternal Grandfather   . Cancer Maternal Grandfather     prostate and bone  . Depression Paternal Grandfather     Social History  Substance Use Topics  . Smoking status: Never Smoker  . Smokeless tobacco: Never Used  . Alcohol use No    Allergies:  Allergies  Allergen Reactions  . Penicillins Itching and Swelling    Has patient had a PCN reaction causing immediate rash, facial/tongue/throat swelling, SOB or lightheadedness with hypotension: No Has patient had a PCN reaction causing severe rash involving mucus membranes or skin necrosis: No Has patient had a PCN reaction that required hospitalization No Has patient had a PCN reaction occurring within the last 10 years: No If all of the above answers are "NO", then may proceed with Cephalosporin use.     Prescriptions Prior to Admission  Medication Sig Dispense Refill Last Dose  . cyclobenzaprine (FLEXERIL) 5 MG tablet Take 2 tablets (10 mg total) by mouth 3 (three) times daily as needed for muscle spasms. (Patient not taking: Reported on 06/04/2016) 30 tablet 0 Not Taking  . doxycycline (VIBRAMYCIN)  100 MG capsule Take 1 capsule (100 mg total) by mouth 2 (two) times daily. One po bid x 7 days (Patient not taking: Reported on 12/15/2016) 14 capsule 0 Not Taking at Unknown time  . ibuprofen (ADVIL,MOTRIN) 600 MG tablet Take 1 tablet (600 mg total) by mouth every 6 (six) hours as needed for mild pain. (Patient not taking: Reported on 06/04/2016) 30 tablet 0 Not Taking  . naproxen (NAPROSYN) 500 MG tablet Take 1 tablet (500 mg total) by mouth 2 (two) times daily as needed for mild pain, moderate pain or headache (TAKE WITH MEALS.). (Patient not taking: Reported on 12/15/2016) 20 tablet 0 Not Taking at Unknown time  . oxyCODONE-acetaminophen (PERCOCET/ROXICET) 5-325 MG tablet Take 1-2 tablets by mouth every 4 (four) hours as needed for  moderate pain or severe pain. (Patient not taking: Reported on 06/04/2016) 30 tablet 0 Not Taking  . polyethylene glycol (MIRALAX) packet Take 17 g by mouth daily. (Patient not taking: Reported on 06/04/2016) 14 each 0 Not Taking  . Prenatal Vit-Fe Fumarate-FA (PRENATAL VITAMINS PLUS) 27-1 MG TABS Take 1 tablet by mouth daily. Generic is OK - Prenatal vitamin with 1 mg Folic acid (Patient not taking: Reported on 03/30/2016) 30 tablet 11 Not Taking  . sulfamethoxazole-trimethoprim (BACTRIM DS,SEPTRA DS) 800-160 MG tablet Take 1 tablet by mouth 2 (two) times daily. (Patient not taking: Reported on 06/04/2016) 20 tablet 0 Not Taking    Review of Systems  Constitutional: Negative.   Gastrointestinal: Positive for abdominal pain, nausea and vomiting. Negative for constipation and diarrhea.  Genitourinary: Negative.    Physical Exam   Blood pressure 120/71, pulse 78, temperature 98.1 F (36.7 C), temperature source Oral, resp. rate 18, height 5\' 6"  (1.676 m), weight 106.6 kg (235 lb), last menstrual period 09/23/2016, not currently breastfeeding.  Physical Exam  Constitutional: She is oriented to person, place, and time. She appears well-developed and well-nourished. She has a sickly appearance ( vomiting into emesis bag ). No distress (vomiting into emesis bag).  HENT:  Head: Normocephalic and atraumatic.  Neck: Normal range of motion. Neck supple.  Cardiovascular: Normal rate.   Respiratory: Effort normal.  GI: Soft. She exhibits no distension and no mass. There is no tenderness. There is no rebound and no guarding.  Genitourinary:  Genitourinary Comments: External: no lesions or erythema Vagina: rugated, parous, small thin white discharge Uterus: non enlarged, anteverted, non tender, no CMT Adnexae: no masses, no tenderness left, no tenderness right   Musculoskeletal: Normal range of motion.  Neurological: She is alert and oriented to person, place, and time.  Skin: Skin is warm and dry.   Psychiatric: She has a normal mood and affect.   Results for orders placed or performed during the hospital encounter of 12/15/16 (from the past 24 hour(s))  Urinalysis, Routine w reflex microscopic     Status: Abnormal   Collection Time: 12/15/16  3:53 PM  Result Value Ref Range   Color, Urine STRAW (A) YELLOW   APPearance CLEAR CLEAR   Specific Gravity, Urine 1.011 1.005 - 1.030   pH 6.0 5.0 - 8.0   Glucose, UA NEGATIVE NEGATIVE mg/dL   Hgb urine dipstick NEGATIVE NEGATIVE   Bilirubin Urine NEGATIVE NEGATIVE   Ketones, ur NEGATIVE NEGATIVE mg/dL   Protein, ur NEGATIVE NEGATIVE mg/dL   Nitrite NEGATIVE NEGATIVE   Leukocytes, UA NEGATIVE NEGATIVE  Pregnancy, urine POC     Status: Abnormal   Collection Time: 12/15/16  4:28 PM  Result Value Ref Range  Preg Test, Ur POSITIVE (A) NEGATIVE  CBC     Status: Abnormal   Collection Time: 12/15/16  8:21 PM  Result Value Ref Range   WBC 9.3 4.0 - 10.5 K/uL   RBC 4.15 3.87 - 5.11 MIL/uL   Hemoglobin 12.2 12.0 - 15.0 g/dL   HCT 16.135.3 (L) 09.636.0 - 04.546.0 %   MCV 85.1 78.0 - 100.0 fL   MCH 29.4 26.0 - 34.0 pg   MCHC 34.6 30.0 - 36.0 g/dL   RDW 40.914.7 81.111.5 - 91.415.5 %   Platelets 330 150 - 400 K/uL  Comprehensive metabolic panel     Status: Abnormal   Collection Time: 12/15/16  8:21 PM  Result Value Ref Range   Sodium 134 (L) 135 - 145 mmol/L   Potassium 3.6 3.5 - 5.1 mmol/L   Chloride 105 101 - 111 mmol/L   CO2 24 22 - 32 mmol/L   Glucose, Bld 88 65 - 99 mg/dL   BUN 8 6 - 20 mg/dL   Creatinine, Ser 7.820.54 0.44 - 1.00 mg/dL   Calcium 8.9 8.9 - 95.610.3 mg/dL   Total Protein 6.8 6.5 - 8.1 g/dL   Albumin 3.3 (L) 3.5 - 5.0 g/dL   AST 14 (L) 15 - 41 U/L   ALT 7 (L) 14 - 54 U/L   Alkaline Phosphatase 49 38 - 126 U/L   Total Bilirubin 0.2 (L) 0.3 - 1.2 mg/dL   GFR calc non Af Amer >60 >60 mL/min   GFR calc Af Amer >60 >60 mL/min   Anion gap 5 5 - 15  hCG, quantitative, pregnancy     Status: Abnormal   Collection Time: 12/15/16  8:21 PM  Result  Value Ref Range   hCG, Beta Chain, Quant, S 102,901 (H) <5 mIU/mL  Wet prep, genital     Status: Abnormal   Collection Time: 12/15/16  8:21 PM  Result Value Ref Range   Yeast Wet Prep HPF POC NONE SEEN NONE SEEN   Trich, Wet Prep NONE SEEN NONE SEEN   Clue Cells Wet Prep HPF POC NONE SEEN NONE SEEN   WBC, Wet Prep HPF POC MODERATE (A) NONE SEEN   Sperm NONE SEEN    [place] Ob Comp Less 14 Wks  Result Date: 12/15/2016 CLINICAL DATA:  Pregnant patient in first-trimester pregnancy with right lower abdominal pain for 2 days. EXAM: OBSTETRIC <14 WK US AND TRANSVAGINAL OB US TECHNIQUE: Both transabdominal and transvaginal ultrasound examinations were performed for complete evaluation of the gestation as well as the maternal uterus, adnexal regions, and pelvic cul-de-sac. Transvaginal technique was performed to assess early pregnancy. COMPARISON:  None this pregnancy. FINDINGS: Intrauterine gestational sac: None Yolk sac:  Visualized. Embryo:  Visualized. Cardiac Activity: Visualized. Heart Rate: 155  bpm CRL:  12.2  mm   7 w   3 d                  US EDC: 07/31/2017 Subchorionic hemorrhage:  None visualized. Maternal uterus/adnexae: The right ovary is normal and contains a corpus luteal cyst measuring 3 cm. The left ovary is not visualized. No adnexal mass. Small volume simple free fluid in the pelvis. IMPRESSION: 1. Single live intrauterine pregnancy estimated gestational age [redacted] weeks 3 days for estimated date of delivery 07/31/2017. No subchorionic hemorrhage. 2. Corpus luteal cyst in the right ovary. Electronically Signed   By: Rubye OaksMelanie  Ehinger M.D.   On: 12/15/2016 23:38   [place] Ob Transvaginal  Result Date: 12/15/2016 CLINICAL  DATA:  Pregnant patient in first-trimester pregnancy with right lower abdominal pain for 2 days. EXAM: OBSTETRIC <14 WK US AND TRANSVAGINAL OB US TECHNIQUE: Both transabdominal and tKorearansvaginal ultrasound examinations were performed for complete evaluation of the gestation as well as  the maternal uterus, adnexal regions, and pelvic cul-de-sac. Transvaginal technique was performed to assess early pregnancy. COMPARISON:  None this pregnancy. FINDINGS: Intrauterine gestational sac: None Yolk sac:  Visualized. Embryo:  Visualized. Cardiac Activity: Visualized. Heart Rate: 155  bpm CRL:  12.2  mm   7 w   3 d                  US EDC: 07/31/2017 Subchorionic hemorrhage:  None visualized. Maternal uterus/adnexae: The right ovary is normal and contains a corpus luteal cyst measuring 3 cm. The left ovary is not visualized. No adnexal mass. Small volume simple free fluid in the pelvis. IMPRESSION: 1. Single live intrauterine pregnancy estimated gestational age [redacted] weeks 3 days for estimated date of delivery 07/31/2017. No subchorionic hemorrhage. 2. Corpus luteal cyst in the right ovary. Electronically Signed   By: Rubye OaksMelanie  Ehinger M.D.   On: 12/15/2016 23:38   MAU Course  Procedures LR 1 L bolus MTV 1 L Pepcid 20 mg IV Phenergan 25 mg IV  MDM Labs and US ordered and reviewed. Single living 2144w3d IUP on US. No evidence of acute abdominal or pelvic process. Pain likely physiologic to early pregnancy. No further episodes of emesis. Tolerating po fluids. Stable for discharge home.  Assessment and Plan   1. Nausea/vomiting in pregnancy   2. Abdominal pain in pregnancy   3. Normal intrauterine pregnancy on prenatal ultrasound in first trimester   4.      IUP at 5244w3d  [facility]-2 weeks to start care SAB precautions Start PNV  Allergies as of 12/16/2016      Reactions   Penicillins Itching, Swelling   Has patient had a PCN reaction causing immediate rash, facial/tongue/throat swelling, SOB or lightheadedness with hypotension: No Has patient had a PCN reaction causing severe rash involving mucus membranes or skin necrosis: No Has patient had a PCN reaction that required hospitalization No Has patient had a PCN reaction occurring within the  last 10 years: No If all of the above answers are "NO", then may proceed with Cephalosporin use.      Medication List    STOP taking these medications   cyclobenzaprine 5 MG tablet Commonly known as:  FLEXERIL   doxycycline 100 MG capsule Commonly known as:  VIBRAMYCIN   ibuprofen 600 MG tablet Commonly known as:  ADVIL,MOTRIN   naproxen 500 MG tablet Commonly known as:  NAPROSYN   oxyCODONE-acetaminophen 5-325 MG tablet Commonly known as:  PERCOCET/ROXICET   polyethylene glycol packet Commonly known as:  MIRALAX   sulfamethoxazole-trimethoprim 800-160 MG tablet Commonly known as:  BACTRIM DS,SEPTRA DS     TAKE these medications   PRENATAL VITAMINS PLUS 27-1 MG Tabs Take 1 tablet by mouth daily. Generic is OK - Prenatal vitamin with 1 mg Folic acid   promethazine 25 MG tablet Commonly known as:  PHENERGAN Take 0.5 tablets (12.5 mg total) by mouth every 6 (six) hours as needed for nausea or vomiting.      Donette LarryMelanie Aharon Carriere, CNM 12/15/2016, 8:13 PM

## 2016-12-15 NOTE — MAU Note (Signed)
Pt C/O nausea & vomiting for the last 1-2 weeks, is very tired.  Also lower abd pain for the last 2 days, denies diarrhea.  Denies bleeding.

## 2016-12-15 NOTE — Discharge Instructions (Signed)

## 2016-12-16 DIAGNOSIS — O219 Vomiting of pregnancy, unspecified: Secondary | ICD-10-CM | POA: Diagnosis not present

## 2016-12-16 DIAGNOSIS — Z3A01 Less than 8 weeks gestation of pregnancy: Secondary | ICD-10-CM

## 2016-12-16 DIAGNOSIS — R109 Unspecified abdominal pain: Secondary | ICD-10-CM

## 2016-12-16 DIAGNOSIS — O26891 Other specified pregnancy related conditions, first trimester: Secondary | ICD-10-CM

## 2016-12-16 DIAGNOSIS — Z3491 Encounter for supervision of normal pregnancy, unspecified, first trimester: Secondary | ICD-10-CM | POA: Diagnosis not present

## 2016-12-16 LAB — GC/CHLAMYDIA PROBE AMP (~~LOC~~) NOT AT ARMC
Chlamydia: NEGATIVE
NEISSERIA GONORRHEA: NEGATIVE

## 2017-04-01 ENCOUNTER — Encounter (HOSPITAL_COMMUNITY): Payer: Self-pay | Admitting: *Deleted

## 2017-04-01 ENCOUNTER — Inpatient Hospital Stay (HOSPITAL_COMMUNITY)
Admission: AD | Admit: 2017-04-01 | Discharge: 2017-04-01 | Disposition: A | Discharge status: Home / Self Care | Payer: Self-pay | Source: Ambulatory Visit | Attending: Obstetrics and Gynecology | Admitting: Obstetrics and Gynecology

## 2017-04-01 DIAGNOSIS — R109 Unspecified abdominal pain: Secondary | ICD-10-CM

## 2017-04-01 DIAGNOSIS — Z9889 Other specified postprocedural states: Secondary | ICD-10-CM | POA: Insufficient documentation

## 2017-04-01 DIAGNOSIS — O26892 Other specified pregnancy related conditions, second trimester: Secondary | ICD-10-CM | POA: Insufficient documentation

## 2017-04-01 DIAGNOSIS — M545 Low back pain: Secondary | ICD-10-CM | POA: Insufficient documentation

## 2017-04-01 DIAGNOSIS — Z3A22 22 weeks gestation of pregnancy: Secondary | ICD-10-CM

## 2017-04-01 DIAGNOSIS — S3991XA Unspecified injury of abdomen, initial encounter: Secondary | ICD-10-CM

## 2017-04-01 DIAGNOSIS — Y939 Activity, unspecified: Secondary | ICD-10-CM | POA: Insufficient documentation

## 2017-04-01 DIAGNOSIS — O9A212 Injury, poisoning and certain other consequences of external causes complicating pregnancy, second trimester: Secondary | ICD-10-CM

## 2017-04-01 DIAGNOSIS — W109XXA Fall (on) (from) unspecified stairs and steps, initial encounter: Secondary | ICD-10-CM

## 2017-04-01 DIAGNOSIS — W19XXXA Unspecified fall, initial encounter: Secondary | ICD-10-CM

## 2017-04-01 DIAGNOSIS — O368120 Decreased fetal movements, second trimester, not applicable or unspecified: Secondary | ICD-10-CM

## 2017-04-01 DIAGNOSIS — W108XXA Fall (on) (from) other stairs and steps, initial encounter: Secondary | ICD-10-CM | POA: Insufficient documentation

## 2017-04-01 DIAGNOSIS — M7918 Myalgia, other site: Secondary | ICD-10-CM

## 2017-04-01 DIAGNOSIS — M549 Dorsalgia, unspecified: Secondary | ICD-10-CM

## 2017-04-01 DIAGNOSIS — Z880 Allergy status to penicillin: Secondary | ICD-10-CM | POA: Insufficient documentation

## 2017-04-01 LAB — URINALYSIS, ROUTINE W REFLEX MICROSCOPIC
BILIRUBIN URINE: NEGATIVE
Glucose, UA: NEGATIVE mg/dL
HGB URINE DIPSTICK: NEGATIVE
KETONES UR: NEGATIVE mg/dL
Leukocytes, UA: NEGATIVE
Nitrite: NEGATIVE
Protein, ur: NEGATIVE mg/dL
SPECIFIC GRAVITY, URINE: 1.02 (ref 1.005–1.030)
pH: 7 (ref 5.0–8.0)

## 2017-04-01 MED ORDER — CYCLOBENZAPRINE HCL 5 MG PO TABS
5.0000 mg | ORAL_TABLET | Freq: Three times a day (TID) | ORAL | Status: DC | PRN
  Administered 2017-04-01: 5 mg via ORAL
  Filled 2017-04-01: qty 1

## 2017-04-01 MED ORDER — CYCLOBENZAPRINE HCL 10 MG PO TABS: 10.0000 mg | ORAL_TABLET | Freq: Three times a day (TID) | ORAL | 0 refills | Status: DC | PRN

## 2017-04-01 MED ORDER — ACETAMINOPHEN 500 MG PO TABS
1000.0000 mg | ORAL_TABLET | Freq: Four times a day (QID) | ORAL | Status: DC | PRN
  Administered 2017-04-01: 1000 mg via ORAL
  Filled 2017-04-01: qty 2

## 2017-04-01 NOTE — MAU Provider Note (Signed)
History     CSN: [id number]  Arrival date and time: 04/01/17 1011   First Provider Initiated Contact with Patient 04/01/17 1113      Chief Complaint  Patient presents with  . Fall  . Decreased Fetal Movement   [id number] .5 weeks here with left flank pain after a fall last night. She fell coming down the stairs around 2100. She landed on her left flank. She reports pain in this area, left shoulder, and left side of lower back. She denies VB or LOF. Decreased FM over the last week. No abdominal pain. No LOC. No head trauma. She has not started [facility] yet.   OB History    Gravida Para Term Preterm AB Living   SAB TAB Ectopic Multiple Live Births   2 0   1 4      Past Medical History:  Diagnosis Date  . Pregnancy induced hypertension     Past Surgical History:  Procedure Laterality Date  . BIOPSY BREAST    . BREAST LUMPECTOMY     left  . CESAREAN SECTION    . CESAREAN SECTION  06/28/2012   Procedure: CESAREAN SECTION;  Surgeon: Lesly Dukes, MD;  Location: [facility];  Service: Gynecology;  Laterality: N/A;  . CESAREAN SECTION N/A 03/21/2016   Procedure: CESAREAN SECTION;  Surgeon: Lazaro Arms, MD;  Location: [facility];  Service: Obstetrics;  Laterality: N/A;  . WISDOM TOOTH EXTRACTION     age 88    Family History  Problem Relation Age of Onset  . Cancer Mother 15    breast  . Alcohol abuse Mother   . Depression Mother   . Hypertension Mother   . Depression Sister   . Hypertension Sister   . Asthma Brother   . Depression Brother   . Hypertension Brother   . Alcohol abuse Maternal Aunt   . Hypertension Maternal Aunt   . Cancer Maternal Aunt 65    breast ca  . Alcohol abuse Maternal Uncle   . Hypertension Maternal Uncle   . Alzheimer's disease Maternal Grandmother   . Heart disease Maternal Grandmother   . Alcohol abuse Maternal Grandmother   . Hypertension Maternal Grandmother   . Heart disease Maternal Grandfather   . Alcohol abuse Maternal  Grandfather   . Hypertension Maternal Grandfather   . Cancer Maternal Grandfather     prostate and bone  . Depression Paternal Grandfather     Social History  Substance Use Topics  . Smoking status: Never Smoker  . Smokeless tobacco: Never Used  . Alcohol use No    Allergies:  Allergies  Allergen Reactions  . Penicillins Itching and Swelling    Has patient had a PCN reaction causing immediate rash, facial/tongue/throat swelling, SOB or lightheadedness with hypotension: No Has patient had a PCN reaction causing severe rash involving mucus membranes or skin necrosis: No Has patient had a PCN reaction that required hospitalization No Has patient had a PCN reaction occurring within the last 10 years: No If all of the above answers are "NO", then may proceed with Cephalosporin use.     Prescriptions Prior to Admission  Medication Sig Dispense Refill Last Dose  . Prenatal Vit-Fe Fumarate-FA (PRENATAL VITAMINS PLUS) 27-1 MG TABS Take 1 tablet by mouth daily. Generic is OK - Prenatal vitamin with 1 mg Folic acid (Patient not taking: Reported on 03/30/2016) 30 tablet 11 Not Taking  . promethazine (PHENERGAN) 25 MG tablet  Take 0.5 tablets (12.5 mg total) by mouth every 6 (six) hours as needed for nausea or vomiting. 30 tablet 0     Review of Systems  Gastrointestinal: Negative for abdominal pain.  Genitourinary: Negative for vaginal bleeding.  Musculoskeletal: Positive for back pain.   Physical Exam   Blood pressure (!) 124/59, pulse 76, temperature 98 F (36.7 C), temperature source Oral, resp. rate 18, last menstrual period 09/23/2016, SpO2 100 %, not currently breastfeeding.  Physical Exam  Constitutional: She is oriented to person, place, and time. She appears well-developed and well-nourished. No distress.  HENT:  Head: Normocephalic and atraumatic.  Neck: Normal range of motion.  Cardiovascular: Normal rate.   Respiratory: Effort normal.  GI: Soft. She exhibits no  distension and no mass. There is tenderness (LLQ). There is no rebound and no guarding.  Musculoskeletal: Normal range of motion. She exhibits tenderness (left flank).       Left shoulder: She exhibits pain. She exhibits normal range of motion, no tenderness and no deformity.       Thoracic back: She exhibits no tenderness, no swelling and no deformity.       Lumbar back: She exhibits tenderness (left of spine). She exhibits no bony tenderness, no edema and no deformity.  Neurological: She is alert and oriented to person, place, and time.  Skin: Skin is warm and dry.  No bruising noted to back or flank  Psychiatric: She has a normal mood and affect.   FHT: 154 Bedside US: active fetus, sub nml AFV, +FHR  Results for orders placed or performed during the hospital encounter of 04/01/17 (from the past 24 hour(s))  Urinalysis, Routine w reflex microscopic     Status: Abnormal   Collection Time: 04/01/17 10:39 AM  Result Value Ref Range   Color, Urine YELLOW YELLOW   APPearance HAZY (A) CLEAR   Specific Gravity, Urine 1.020 1.005 - 1.030   pH 7.0 5.0 - 8.0   Glucose, UA NEGATIVE NEGATIVE mg/dL   Hgb urine dipstick NEGATIVE NEGATIVE   Bilirubin Urine NEGATIVE NEGATIVE   Ketones, ur NEGATIVE NEGATIVE mg/dL   Protein, ur NEGATIVE NEGATIVE mg/dL   Nitrite NEGATIVE NEGATIVE   Leukocytes, UA NEGATIVE NEGATIVE   MAU Course  Procedures Tylenol Flexeril Heat pad  MDM Labs ordered and reviewed. Fetal status reassuring. Pain improved after meds and heat. Stable for discharge home.  Assessment and Plan   1. [redacted] weeks gestation of pregnancy   2. Fall, initial encounter   3. Musculoskeletal back pain   4. Pain in abdominal muscle of left flank    Discharge home Follow up with [facility]-schedule appt to start care Abruption precautions Start PNV 1 po daily-OTC gummies if desires  Allergies as of 04/01/2017      Reactions   Penicillins Itching, Swelling   Has patient had a PCN reaction  causing immediate rash, facial/tongue/throat swelling, SOB or lightheadedness with hypotension: No Has patient had a PCN reaction causing severe rash involving mucus membranes or skin necrosis: No Has patient had a PCN reaction that required hospitalization No Has patient had a PCN reaction occurring within the last 10 years: No If all of the above answers are "NO", then may proceed with Cephalosporin use.      Medication List    STOP taking these medications   promethazine 25 MG tablet Commonly known as:  PHENERGAN     TAKE these medications   cyclobenzaprine 10 MG tablet Commonly known as:  FLEXERIL Take 1  tablet (10 mg total) by mouth 3 (three) times daily as needed for muscle spasms.   PRENATAL VITAMINS PLUS 27-1 MG Tabs Take 1 tablet by mouth daily. Generic is OK - Prenatal vitamin with 1 mg Folic acid      Donette Larry, CNM 04/01/2017, 11:51 AM

## 2017-04-01 NOTE — Discharge Instructions (Signed)
Back Pain, Adult  Back pain is very common. The pain often gets better over time. The cause of back pain is usually not dangerous. Most people can learn to manage their back pain on their own.  Follow these instructions at home:  Watch your back pain for any changes. The following actions may help to lessen any pain you are feeling:  · Stay active. Start with short walks on flat ground if you can. Try to walk farther each day.  · Exercise regularly as told by your doctor. Exercise helps your back heal faster. It also helps avoid future injury by keeping your muscles strong and flexible.  · Do not sit, drive, or stand in one place for more than 30 minutes.  · Do not stay in bed. Resting more than 1-2 days can slow down your recovery.  · Be careful when you bend or lift an object. Use good form when lifting:  ? Bend at your knees.  ? Keep the object close to your body.  ? Do not twist.  · Sleep on a firm mattress. Lie on your side, and bend your knees. If you lie on your back, put a pillow under your knees.  · Take medicines only as told by your doctor.  · Put ice on the injured area.  ? Put ice in a plastic bag.  ? Place a towel between your skin and the bag.  ? Leave the ice on for 20 minutes, 2-3 times a day for the first 2-3 days. After that, you can switch between ice and heat packs.  · Avoid feeling anxious or stressed. Find good ways to deal with stress, such as exercise.  · Maintain a healthy weight. Extra weight puts stress on your back.    Contact a doctor if:  · You have pain that does not go away with rest or medicine.  · You have worsening pain that goes down into your legs or buttocks.  · You have pain that does not get better in one week.  · You have pain at night.  · You lose weight.  · You have a fever or chills.  Get help right away if:  · You cannot control when you poop (bowel movement) or pee (urinate).  · Your arms or legs feel weak.  · Your arms or legs lose feeling (numbness).  · You feel sick  to your stomach (nauseous) or throw up (vomit).  · You have belly (abdominal) pain.  · You feel like you may pass out (faint).  This information is not intended to replace advice given to you by your health care provider. Make sure you discuss any questions you have with your health care provider.  Document Released: 05/25/2008 Document Revised: 05/14/2016 Document Reviewed: 04/10/2014  Elsevier Interactive Patient Education © 2017 [employer]

## 2017-04-01 NOTE — MAU Note (Signed)
c/o falling last night around 2100; pt was going down the stairs to catch a phone call and she slipped in her sock feet; fell on her L flank; c/o lower back pain and groin pain;

## 2017-04-01 NOTE — MAU Note (Signed)
c/o decreased fetal movement for past week;

## 2017-04-01 NOTE — MAU Note (Addendum)
Pt fell down some stairs last night and slid down several stairs on her left side. 7/10. Having some abdominal cramping on left side and some pain down her left side/back. Pt also complains of feeling dizzy and nauseous the past month.

## 2017-05-04 ENCOUNTER — Other Ambulatory Visit (HOSPITAL_COMMUNITY): Payer: Self-pay | Admitting: Certified Nurse Midwife

## 2017-05-04 ENCOUNTER — Ambulatory Visit (HOSPITAL_COMMUNITY)
Admission: RE | Admit: 2017-05-04 | Discharge: 2017-05-04 | Disposition: A | Discharge status: Home / Self Care | Payer: Self-pay | Source: Ambulatory Visit | Attending: Certified Nurse Midwife | Admitting: Certified Nurse Midwife

## 2017-05-04 DIAGNOSIS — O0932 Supervision of pregnancy with insufficient antenatal care, second trimester: Secondary | ICD-10-CM

## 2017-05-04 DIAGNOSIS — O99212 Obesity complicating pregnancy, second trimester: Secondary | ICD-10-CM

## 2017-05-04 DIAGNOSIS — O34219 Maternal care for unspecified type scar from previous cesarean delivery: Secondary | ICD-10-CM | POA: Insufficient documentation

## 2017-05-04 DIAGNOSIS — O99213 Obesity complicating pregnancy, third trimester: Secondary | ICD-10-CM | POA: Insufficient documentation

## 2017-05-04 DIAGNOSIS — O09299 Supervision of pregnancy with other poor reproductive or obstetric history, unspecified trimester: Secondary | ICD-10-CM | POA: Insufficient documentation

## 2017-05-04 DIAGNOSIS — Z3A22 22 weeks gestation of pregnancy: Secondary | ICD-10-CM

## 2017-05-04 DIAGNOSIS — Z363 Encounter for antenatal screening for malformations: Secondary | ICD-10-CM | POA: Insufficient documentation

## 2017-05-04 DIAGNOSIS — Z3A27 27 weeks gestation of pregnancy: Secondary | ICD-10-CM | POA: Insufficient documentation

## 2017-05-04 DIAGNOSIS — O0933 Supervision of pregnancy with insufficient antenatal care, third trimester: Secondary | ICD-10-CM | POA: Insufficient documentation

## 2017-07-18 ENCOUNTER — Inpatient Hospital Stay (HOSPITAL_COMMUNITY)
Admission: AD | Admit: 2017-07-18 | Discharge: 2017-07-18 | Disposition: A | Discharge status: Home / Self Care | Payer: Self-pay | Source: Ambulatory Visit | Attending: Obstetrics and Gynecology | Admitting: Obstetrics and Gynecology

## 2017-07-18 ENCOUNTER — Encounter (HOSPITAL_COMMUNITY): Payer: Self-pay

## 2017-07-18 DIAGNOSIS — O26893 Other specified pregnancy related conditions, third trimester: Secondary | ICD-10-CM | POA: Insufficient documentation

## 2017-07-18 DIAGNOSIS — Z3A38 38 weeks gestation of pregnancy: Secondary | ICD-10-CM | POA: Insufficient documentation

## 2017-07-18 DIAGNOSIS — Z880 Allergy status to penicillin: Secondary | ICD-10-CM | POA: Insufficient documentation

## 2017-07-18 DIAGNOSIS — Z79899 Other long term (current) drug therapy: Secondary | ICD-10-CM | POA: Insufficient documentation

## 2017-07-18 DIAGNOSIS — N898 Other specified noninflammatory disorders of vagina: Secondary | ICD-10-CM | POA: Insufficient documentation

## 2017-07-18 DIAGNOSIS — O26899 Other specified pregnancy related conditions, unspecified trimester: Secondary | ICD-10-CM

## 2017-07-18 DIAGNOSIS — Z98891 History of uterine scar from previous surgery: Secondary | ICD-10-CM

## 2017-07-18 DIAGNOSIS — M549 Dorsalgia, unspecified: Secondary | ICD-10-CM

## 2017-07-18 DIAGNOSIS — M545 Low back pain: Secondary | ICD-10-CM | POA: Insufficient documentation

## 2017-07-18 DIAGNOSIS — R109 Unspecified abdominal pain: Secondary | ICD-10-CM | POA: Insufficient documentation

## 2017-07-18 DIAGNOSIS — O99891 Other specified diseases and conditions complicating pregnancy: Secondary | ICD-10-CM

## 2017-07-18 DIAGNOSIS — O9989 Other specified diseases and conditions complicating pregnancy, childbirth and the puerperium: Secondary | ICD-10-CM

## 2017-07-18 DIAGNOSIS — O0933 Supervision of pregnancy with insufficient antenatal care, third trimester: Secondary | ICD-10-CM | POA: Insufficient documentation

## 2017-07-18 LAB — URINALYSIS, ROUTINE W REFLEX MICROSCOPIC
BILIRUBIN URINE: NEGATIVE
Glucose, UA: NEGATIVE mg/dL
Hgb urine dipstick: NEGATIVE
KETONES UR: 80 mg/dL — AB
LEUKOCYTES UA: NEGATIVE
Nitrite: NEGATIVE
PROTEIN: 100 mg/dL — AB
Specific Gravity, Urine: 1.027 (ref 1.005–1.030)
pH: 5 (ref 5.0–8.0)

## 2017-07-18 LAB — WET PREP, GENITAL
Clue Cells Wet Prep HPF POC: NONE SEEN
Sperm: NONE SEEN
TRICH WET PREP: NONE SEEN
YEAST WET PREP: NONE SEEN

## 2017-07-18 MED ORDER — CYCLOBENZAPRINE HCL 10 MG PO TABS
10.0000 mg | ORAL_TABLET | Freq: Three times a day (TID) | ORAL | Status: DC | PRN
  Administered 2017-07-18: 10 mg via ORAL
  Filled 2017-07-18: qty 1

## 2017-07-18 MED ORDER — CYCLOBENZAPRINE HCL 10 MG PO TABS: 10.0000 mg | ORAL_TABLET | Freq: Three times a day (TID) | ORAL | 0 refills | Status: DC | PRN

## 2017-07-18 NOTE — MAU Provider Note (Signed)
History     CSN: [id number]  Arrival date and time: 07/18/17 4098   First Provider Initiated Contact with Patient 07/18/17 2003      Chief Complaint  Patient presents with  . Back Pain  . Abdominal Pain  . Vaginal Discharge   [phone] @38 .1 weeks here with lower back pain and pelvic pressure. Sx started about 2-3 days ago. Back pain is constant in worse on the left. Denies dysuria and hematuria but reports urgency x2 days. No fevers. Reports good FM. No VB, LOF, or ctx. Reports clear malodorous vaginal discharge. She has not received [facility] d/t childcare issues. Previous CS x3 and hx of GDM and GHTN in previous pregnancy.     OB History    Gravida Para Term Preterm AB Living   6 3 2 1 2 4    SAB TAB Ectopic Multiple Live Births   2 0   1 4      Past Medical History:  Diagnosis Date  . Pregnancy induced hypertension     Past Surgical History:  Procedure Laterality Date  . BIOPSY BREAST    . BREAST LUMPECTOMY     left  . CESAREAN SECTION    . CESAREAN SECTION  06/28/2012   Procedure: CESAREAN SECTION;  Surgeon: Lesly Dukes, MD;  Location: [facility];  Service: Gynecology;  Laterality: N/A;  . CESAREAN SECTION N/A 03/21/2016   Procedure: CESAREAN SECTION;  Surgeon: Lazaro Arms, MD;  Location: [facility];  Service: Obstetrics;  Laterality: N/A;  . WISDOM TOOTH EXTRACTION     age 38    Family History  Problem Relation Age of Onset  . Cancer Mother 74       breast  . Alcohol abuse Mother   . Depression Mother   . Hypertension Mother   . Depression Sister   . Hypertension Sister   . Asthma Brother   . Depression Brother   . Hypertension Brother   . Alcohol abuse Maternal Aunt   . Hypertension Maternal Aunt   . Cancer Maternal Aunt 65       breast ca  . Alcohol abuse Maternal Uncle   . Hypertension Maternal Uncle   . Alzheimer's disease Maternal Grandmother   . Heart disease Maternal Grandmother   . Alcohol abuse Maternal Grandmother   . Hypertension Maternal  Grandmother   . Heart disease Maternal Grandfather   . Alcohol abuse Maternal Grandfather   . Hypertension Maternal Grandfather   . Cancer Maternal Grandfather        prostate and bone  . Depression Paternal Grandfather     Social History  Substance Use Topics  . Smoking status: Never Smoker  . Smokeless tobacco: Never Used  . Alcohol use No    Allergies:  Allergies  Allergen Reactions  . Penicillins Itching and Swelling    Has patient had a PCN reaction causing immediate rash, facial/tongue/throat swelling, SOB or lightheadedness with hypotension: No Has patient had a PCN reaction causing severe rash involving mucus membranes or skin necrosis: No Has patient had a PCN reaction that required hospitalization No Has patient had a PCN reaction occurring within the last 10 years: No If all of the above answers are "NO", then may proceed with Cephalosporin use.     Prescriptions Prior to Admission  Medication Sig Dispense Refill Last Dose  . Prenatal Vit-Fe Fumarate-FA (PRENATAL VITAMINS PLUS) 27-1 MG TABS Take 1 tablet by mouth daily. Generic is OK - Prenatal vitamin with 1 mg Folic  acid 30 tablet 11 Past Week at Unknown time  . cyclobenzaprine (FLEXERIL) 10 MG tablet Take 1 tablet (10 mg total) by mouth 3 (three) times daily as needed for muscle spasms. 20 tablet 0 More than a month at Unknown time    Review of Systems  Constitutional: Negative for fever.  Gastrointestinal: Positive for nausea. Negative for constipation and diarrhea.  Genitourinary: Positive for pelvic pain, urgency and vaginal discharge. Negative for dysuria and vaginal bleeding.   Physical Exam   Blood pressure 131/60, pulse 85, temperature 97.9 F (36.6 C), temperature source Oral, resp. rate 19, height 5\' 6"  (1.676 m), weight 258 lb (117 kg), last menstrual period 09/23/2016, SpO2 100 %, not currently breastfeeding.  Physical Exam  Constitutional: She is oriented to person, place, and time. She appears  well-developed and well-nourished. No distress.  HENT:  Head: Normocephalic and atraumatic.  Neck: Normal range of motion.  Cardiovascular: Normal rate.   Respiratory: Effort normal. No respiratory distress.  GI: Soft. She exhibits no distension. There is no tenderness.  gravid  Genitourinary:  Genitourinary Comments: SVE closed/thick  Musculoskeletal: Normal range of motion.  Neurological: She is alert and oriented to person, place, and time.  Skin: Skin is warm and dry.  Psychiatric: She has a normal mood and affect.  EFM: 145 bpm, mod variability, + accels, no decels Toco: none  Results for orders placed or performed during the hospital encounter of 07/18/17 (from the past 24 hour(s))  Urinalysis, Routine w reflex microscopic     Status: Abnormal   Collection Time: 07/18/17  7:33 PM  Result Value Ref Range   Color, Urine YELLOW YELLOW   APPearance HAZY (A) CLEAR   Specific Gravity, Urine 1.027 1.005 - 1.030   pH 5.0 5.0 - 8.0   Glucose, UA NEGATIVE NEGATIVE mg/dL   Hgb urine dipstick NEGATIVE NEGATIVE   Bilirubin Urine NEGATIVE NEGATIVE   Ketones, ur 80 (A) NEGATIVE mg/dL   Protein, ur 191100 (A) NEGATIVE mg/dL   Nitrite NEGATIVE NEGATIVE   Leukocytes, UA NEGATIVE NEGATIVE   RBC / HPF 0-5 0 - 5 RBC/hpf   WBC, UA 0-5 0 - 5 WBC/hpf   Bacteria, UA RARE (A) NONE SEEN   Squamous Epithelial / LPF 0-5 (A) NONE SEEN   Mucous PRESENT   Wet prep, genital     Status: Abnormal   Collection Time: 07/18/17  8:13 PM  Result Value Ref Range   Yeast Wet Prep HPF POC NONE SEEN NONE SEEN   Trich, Wet Prep NONE SEEN NONE SEEN   Clue Cells Wet Prep HPF POC NONE SEEN NONE SEEN   WBC, Wet Prep HPF POC MODERATE (A) NONE SEEN   Sperm NONE SEEN    MAU Course  Procedures Flexeril  Heating pad  MDM Labs ordered and reviewed. Message sent to admin pool to scheduled PN appt.  Transfer of care given to M.Lorenda PeckWilliams, CNM Bhambri, Melanie, CNM  07/18/2017 8:58 PM    Assessment and Plan  A:    [place] IUP at 2444w1d        Back pain       No prenatal care        Prior C/S   P:   Feels better after medication        Discharge home        Rx Flexeril for back pain        Encouraged to come this week for prenatal visit to schedule C/S  Encouraged to return here or to other Urgent Care/ED if she develops worsening of symptoms, increase in pain, fever, or other concerning symptoms.

## 2017-07-18 NOTE — Discharge Instructions (Signed)
Abdominal Pain During Pregnancy Abdominal pain is common in pregnancy. Most of the time, it does not cause harm. There are many causes of abdominal pain. Some causes are more serious than others and sometimes the cause is not known. Abdominal pain can be a sign that something is very wrong with the pregnancy or the pain may have nothing to do with the pregnancy. Always tell your health care provider if you have any abdominal pain. Follow these instructions at home:  Do not have sex or put anything in your vagina until your symptoms go away completely.  Watch your abdominal pain for any changes.  Get plenty of rest until your pain improves.  Drink enough fluid to keep your urine clear or pale yellow.  Take over-the-counter or prescription medicines only as told by your health care provider.  Keep all follow-up visits as told by your health care provider. This is important. Contact a health care provider if:  You have a fever.  Your pain gets worse or you have cramping.  Your pain continues after resting. Get help right away if:  You are bleeding, leaking fluid, or passing tissue from the vagina.  You have vomiting or diarrhea that does not go away.  You have painful or bloody urination.  You notice a decrease in your baby's movements.  You feel very weak or faint.  You have shortness of breath.  You develop a severe headache with abdominal pain.  You have abnormal vaginal discharge with abdominal pain. This information is not intended to replace advice given to you by your health care provider. Make sure you discuss any questions you have with your health care provider. Document Released: 12/07/2005 Document Revised: 09/17/2016 Document Reviewed: 07/06/2013 Elsevier Interactive Patient Education  [employer].  Third Trimester of Pregnancy The third trimester is from week 28 through week 40 (months 7 through 9). The third trimester is a time when the unborn baby  (fetus) is growing rapidly. At the end of the ninth month, the fetus is about 20 inches in length and weighs 6-10 pounds. Body changes during your third trimester Your body will continue to go through many changes during pregnancy. The changes vary from woman to woman. During the third trimester:  Your weight will continue to increase. You can expect to gain 25-35 pounds (11-16 kg) by the end of the pregnancy.  You may begin to get stretch marks on your hips, abdomen, and breasts.  You may urinate more often because the fetus is moving lower into your pelvis and pressing on your bladder.  You may develop or continue to have heartburn. This is caused by increased hormones that slow down muscles in the digestive tract.  You may develop or continue to have constipation because increased hormones slow digestion and cause the muscles that push waste through your intestines to relax.  You may develop hemorrhoids. These are swollen veins (varicose veins) in the rectum that can itch or be painful.  You may develop swollen, bulging veins (varicose veins) in your legs.  You may have increased body aches in the pelvis, back, or thighs. This is due to weight gain and increased hormones that are relaxing your joints.  You may have changes in your hair. These can include thickening of your hair, rapid growth, and changes in texture. Some women also have hair loss during or after pregnancy, or hair that feels dry or thin. Your hair will most likely return to normal after your baby is born.  Your breasts will continue to grow and they will continue to become tender. A yellow fluid (colostrum) may leak from your breasts. This is the first milk you are producing for your baby.  Your belly button may stick out.  You may notice more swelling in your hands, face, or ankles.  You may have increased tingling or numbness in your hands, arms, and legs. The skin on your belly may also feel numb.  You may feel  short of breath because of your expanding uterus.  You may have more problems sleeping. This can be caused by the size of your belly, increased need to urinate, and an increase in your body's metabolism.  You may notice the fetus "dropping," or moving lower in your abdomen (lightening).  You may have increased vaginal discharge.  You may notice your joints feel loose and you may have pain around your pelvic bone.  What to expect at prenatal visits You will have prenatal exams every 2 weeks until week 36. Then you will have weekly prenatal exams. During a routine prenatal visit:  You will be weighed to make sure you and the baby are growing normally.  Your blood pressure will be taken.  Your abdomen will be measured to track your baby's growth.  The fetal heartbeat will be listened to.  Any test results from the previous visit will be discussed.  You may have a cervical check near your due date to see if your cervix has softened or thinned (effaced).  You will be tested for Group B streptococcus. This happens between 35 and 37 weeks.  Your health care provider may ask you:  What your birth plan is.  How you are feeling.  If you are feeling the baby move.  If you have had any abnormal symptoms, such as leaking fluid, bleeding, severe headaches, or abdominal cramping.  If you are using any tobacco products, including cigarettes, chewing tobacco, and electronic cigarettes.  If you have any questions.  Other tests or screenings that may be performed during your third trimester include:  Blood tests that check for low iron levels (anemia).  Fetal testing to check the health, activity level, and growth of the fetus. Testing is done if you have certain medical conditions or if there are problems during the pregnancy.  Nonstress test (NST). This test checks the health of your baby to make sure there are no signs of problems, such as the baby not getting enough oxygen. During  this test, a belt is placed around your belly. The baby is made to move, and its heart rate is monitored during movement.  What is false labor? False labor is a condition in which you feel small, irregular tightenings of the muscles in the womb (contractions) that usually go away with rest, changing position, or drinking water. These are called Braxton Hicks contractions. Contractions may last for hours, days, or even weeks before true labor sets in. If contractions come at regular intervals, become more frequent, increase in intensity, or become painful, you should see your health care provider. What are the signs of labor?  Abdominal cramps.  Regular contractions that start at 10 minutes apart and become stronger and more frequent with time.  Contractions that start on the top of the uterus and spread down to the lower abdomen and back.  Increased pelvic pressure and dull back pain.  A watery or bloody mucus discharge that comes from the vagina.  Leaking of amniotic fluid. This is also known as  your "water breaking." It could be a slow trickle or a gush. Let your health care provider know if it has a color or strange odor. If you have any of these signs, call your health care provider right away, even if it is before your due date. Follow these instructions at home: Medicines  Follow your health care provider's instructions regarding medicine use. Specific medicines may be either safe or unsafe to take during pregnancy.  Take a prenatal vitamin that contains at least 600 micrograms (mcg) of folic acid.  If you develop constipation, try taking a stool softener if your health care provider approves. Eating and drinking  Eat a balanced diet that includes fresh fruits and vegetables, whole grains, good sources of protein such as meat, eggs, or tofu, and low-fat dairy. Your health care provider will help you determine the amount of weight gain that is right for you.  Avoid raw meat and  uncooked cheese. These carry germs that can cause birth defects in the baby.  If you have low calcium intake from food, talk to your health care provider about whether you should take a daily calcium supplement.  Eat four or five small meals rather than three large meals a day.  Limit foods that are high in fat and processed sugars, such as fried and sweet foods.  To prevent constipation: ? Drink enough fluid to keep your urine clear or pale yellow. ? Eat foods that are high in fiber, such as fresh fruits and vegetables, whole grains, and beans. Activity  Exercise only as directed by your health care provider. Most women can continue their usual exercise routine during pregnancy. Try to exercise for 30 minutes at least 5 days a week. Stop exercising if you experience uterine contractions.  Avoid heavy lifting.  Do not exercise in extreme heat or humidity, or at high altitudes.  Wear low-heel, comfortable shoes.  Practice good posture.  You may continue to have sex unless your health care provider tells you otherwise. Relieving pain and discomfort  Take frequent breaks and rest with your legs elevated if you have leg cramps or low back pain.  Take warm sitz baths to soothe any pain or discomfort caused by hemorrhoids. Use hemorrhoid cream if your health care provider approves.  Wear a good support bra to prevent discomfort from breast tenderness.  If you develop varicose veins: ? Wear support pantyhose or compression stockings as told by your healthcare provider. ? Elevate your feet for 15 minutes, 3-4 times a day. Prenatal care  Write down your questions. Take them to your prenatal visits.  Keep all your prenatal visits as told by your health care provider. This is important. Safety  Wear your seat belt at all times when driving.  Make a list of emergency phone numbers, including numbers for family, friends, the hospital, and police and fire departments. General  instructions  Avoid cat litter boxes and soil used by cats. These carry germs that can cause birth defects in the baby. If you have a cat, ask someone to clean the litter box for you.  Do not travel far distances unless it is absolutely necessary and only with the approval of your health care provider.  Do not use hot tubs, steam rooms, or saunas.  Do not drink alcohol.  Do not use any products that contain nicotine or tobacco, such as cigarettes and e-cigarettes. If you need help quitting, ask your health care provider.  Do not use any medicinal herbs or unprescribed  drugs. These chemicals affect the formation and growth of the baby.  Do not douche or use tampons or scented sanitary pads.  Do not cross your legs for long periods of time.  To prepare for the arrival of your baby: ? Take prenatal classes to understand, practice, and ask questions about labor and delivery. ? Make a trial run to the hospital. ? Visit the hospital and tour the maternity area. ? Arrange for maternity or paternity leave through employers. ? Arrange for family and friends to take care of pets while you are in the hospital. ? Purchase a rear-facing car seat and make sure you know how to install it in your car. ? Pack your hospital bag. ? Prepare the babys nursery. Make sure to remove all pillows and stuffed animals from the baby's crib to prevent suffocation.  Visit your dentist if you have not gone during your pregnancy. Use a soft toothbrush to brush your teeth and be gentle when you floss. Contact a health care provider if:  You are unsure if you are in labor or if your water has broken.  You become dizzy.  You have mild pelvic cramps, pelvic pressure, or nagging pain in your abdominal area.  You have lower back pain.  You have persistent nausea, vomiting, or diarrhea.  You have an unusual or bad smelling vaginal discharge.  You have pain when you urinate. Get help right away if:  Your water  breaks before 37 weeks.  You have regular contractions less than 5 minutes apart before 37 weeks.  You have a fever.  You are leaking fluid from your vagina.  You have spotting or bleeding from your vagina.  You have severe abdominal pain or cramping.  You have rapid weight loss or weight gain.  You have shortness of breath with chest pain.  You notice sudden or extreme swelling of your face, hands, ankles, feet, or legs.  Your baby makes fewer than 10 movements in 2 hours.  You have severe headaches that do not go away when you take medicine.  You have vision changes. Summary  The third trimester is from week 28 through week 40, months 7 through 9. The third trimester is a time when the unborn baby (fetus) is growing rapidly.  During the third trimester, your discomfort may increase as you and your baby continue to gain weight. You may have abdominal, leg, and back pain, sleeping problems, and an increased need to urinate.  During the third trimester your breasts will keep growing and they will continue to become tender. A yellow fluid (colostrum) may leak from your breasts. This is the first milk you are producing for your baby.  False labor is a condition in which you feel small, irregular tightenings of the muscles in the womb (contractions) that eventually go away. These are called Braxton Hicks contractions. Contractions may last for hours, days, or even weeks before true labor sets in.  Signs of labor can include: abdominal cramps; regular contractions that start at 10 minutes apart and become stronger and more frequent with time; watery or bloody mucus discharge that comes from the vagina; increased pelvic pressure and dull back pain; and leaking of amniotic fluid. This information is not intended to replace advice given to you by your health care provider. Make sure you discuss any questions you have with your health care provider. Document Released: 12/01/2001 Document  Revised: 05/14/2016 Document Reviewed: 02/07/2013 Elsevier Interactive Patient Education  2017 [employer]

## 2017-07-18 NOTE — MAU Note (Signed)
Pt reports lower abdominal and back pain that started yesterday. States the pain is constant. Pt denies vaginal bleeding or LOF. Pt does report a thick discharge with and odor. Reports good fetal movement. Pt has not received PNC.

## 2017-07-19 LAB — GC/CHLAMYDIA PROBE AMP (~~LOC~~) NOT AT ARMC
CHLAMYDIA, DNA PROBE: NEGATIVE
NEISSERIA GONORRHEA: NEGATIVE

## 2017-07-20 ENCOUNTER — Ambulatory Visit (INDEPENDENT_AMBULATORY_CARE_PROVIDER_SITE_OTHER): Payer: Self-pay | Admitting: Obstetrics and Gynecology

## 2017-07-20 VITALS — BP 120/66 | HR 79 | Wt 259.7 lb

## 2017-07-20 DIAGNOSIS — O0933 Supervision of pregnancy with insufficient antenatal care, third trimester: Secondary | ICD-10-CM

## 2017-07-20 DIAGNOSIS — Z3483 Encounter for supervision of other normal pregnancy, third trimester: Secondary | ICD-10-CM

## 2017-07-20 DIAGNOSIS — O09293 Supervision of pregnancy with other poor reproductive or obstetric history, third trimester: Secondary | ICD-10-CM

## 2017-07-20 DIAGNOSIS — Z3480 Encounter for supervision of other normal pregnancy, unspecified trimester: Secondary | ICD-10-CM | POA: Insufficient documentation

## 2017-07-20 DIAGNOSIS — O09299 Supervision of pregnancy with other poor reproductive or obstetric history, unspecified trimester: Secondary | ICD-10-CM

## 2017-07-20 DIAGNOSIS — Z8632 Personal history of gestational diabetes: Secondary | ICD-10-CM

## 2017-07-20 DIAGNOSIS — Z98891 History of uterine scar from previous surgery: Secondary | ICD-10-CM

## 2017-07-20 DIAGNOSIS — O34219 Maternal care for unspecified type scar from previous cesarean delivery: Secondary | ICD-10-CM

## 2017-07-20 HISTORY — DX: Supervision of pregnancy with insufficient antenatal care, third trimester: O09.33

## 2017-07-20 NOTE — Progress Notes (Signed)
Subjective:    Laurine BlazerChiquita J Traughber is a 31 y.o. [id number] being seen today for her obstetrical visit.  Patient reports no complaints. Fetal movement: normal. She is here for her first visit. Patient with history of gestational diabetes and previous c-section x 3. She plans Nexplanon for contraception  Objective:    BP 120/66   Pulse 79   Wt 259 lb 11.2 oz (117.8 kg)   LMP 09/23/2016   BMI 41.92 kg/m   Physical Exam  Exam  FHT: Fetal Heart Rate (bpm): 138  Uterine Size: Fundal Height: 39 cm  Presentation:       Assessment:    Pregnancy:  [id number]    Plan:    Patient Active Problem List   Diagnosis Date Noted  . Insufficient prenatal care in third trimester 07/20/2017  . Supervision of other normal pregnancy, antepartum 07/20/2017  . Previous cesarean section 03/21/2016    .  patient at [id number] here for initial ob. Patient reports inability to obtain child care to make it to her appointments - Ob panel and A1C obtained - anatomy ultrasound reviewed - patient will be scheduled for repeat c-section at 39 weeks - RTC in 4 weeks for postpartum visit and Nexplanon insertion

## 2017-07-20 NOTE — Patient Instructions (Signed)
 Third Trimester of Pregnancy The third trimester is from week 28 through week 40 (months 7 through 9). The third trimester is a time when the unborn baby (fetus) is growing rapidly. At the end of the ninth month, the fetus is about 20 inches in length and weighs 6-10 pounds. Body changes during your third trimester Your body will continue to go through many changes during pregnancy. The changes vary from woman to woman. During the third trimester:  Your weight will continue to increase. You can expect to gain 25-35 pounds (11-16 kg) by the end of the pregnancy.  You may begin to get stretch marks on your hips, abdomen, and breasts.  You may urinate more often because the fetus is moving lower into your pelvis and pressing on your bladder.  You may develop or continue to have heartburn. This is caused by increased hormones that slow down muscles in the digestive tract.  You may develop or continue to have constipation because increased hormones slow digestion and cause the muscles that push waste through your intestines to relax.  You may develop hemorrhoids. These are swollen veins (varicose veins) in the rectum that can itch or be painful.  You may develop swollen, bulging veins (varicose veins) in your legs.  You may have increased body aches in the pelvis, back, or thighs. This is due to weight gain and increased hormones that are relaxing your joints.  You may have changes in your hair. These can include thickening of your hair, rapid growth, and changes in texture. Some women also have hair loss during or after pregnancy, or hair that feels dry or thin. Your hair will most likely return to normal after your baby is born.  Your breasts will continue to grow and they will continue to become tender. A yellow fluid (colostrum) may leak from your breasts. This is the first milk you are producing for your baby.  Your belly button may stick out.  You may notice more swelling in your  hands, face, or ankles.  You may have increased tingling or numbness in your hands, arms, and legs. The skin on your belly may also feel numb.  You may feel short of breath because of your expanding uterus.  You may have more problems sleeping. This can be caused by the size of your belly, increased need to urinate, and an increase in your body's metabolism.  You may notice the fetus "dropping," or moving lower in your abdomen (lightening).  You may have increased vaginal discharge.  You may notice your joints feel loose and you may have pain around your pelvic bone.  What to expect at prenatal visits You will have prenatal exams every 2 weeks until week 36. Then you will have weekly prenatal exams. During a routine prenatal visit:  You will be weighed to make sure you and the baby are growing normally.  Your blood pressure will be taken.  Your abdomen will be measured to track your baby's growth.  The fetal heartbeat will be listened to.  Any test results from the previous visit will be discussed.  You may have a cervical check near your due date to see if your cervix has softened or thinned (effaced).  You will be tested for Group B streptococcus. This happens between 35 and 37 weeks.  Your health care provider may ask you:  What your birth plan is.  How you are feeling.  If you are feeling the baby move.  If you have   had any abnormal symptoms, such as leaking fluid, bleeding, severe headaches, or abdominal cramping.  If you are using any tobacco products, including cigarettes, chewing tobacco, and electronic cigarettes.  If you have any questions.  Other tests or screenings that may be performed during your third trimester include:  Blood tests that check for low iron levels (anemia).  Fetal testing to check the health, activity level, and growth of the fetus. Testing is done if you have certain medical conditions or if there are problems during the  pregnancy.  Nonstress test (NST). This test checks the health of your baby to make sure there are no signs of problems, such as the baby not getting enough oxygen. During this test, a belt is placed around your belly. The baby is made to move, and its heart rate is monitored during movement.  What is false labor? False labor is a condition in which you feel small, irregular tightenings of the muscles in the womb (contractions) that usually go away with rest, changing position, or drinking water. These are called Braxton Hicks contractions. Contractions may last for hours, days, or even weeks before true labor sets in. If contractions come at regular intervals, become more frequent, increase in intensity, or become painful, you should see your health care provider. What are the signs of labor?  Abdominal cramps.  Regular contractions that start at 10 minutes apart and become stronger and more frequent with time.  Contractions that start on the top of the uterus and spread down to the lower abdomen and back.  Increased pelvic pressure and dull back pain.  A watery or bloody mucus discharge that comes from the vagina.  Leaking of amniotic fluid. This is also known as your "water breaking." It could be a slow trickle or a gush. Let your health care provider know if it has a color or strange odor. If you have any of these signs, call your health care provider right away, even if it is before your due date. Follow these instructions at home: Medicines  Follow your health care provider's instructions regarding medicine use. Specific medicines may be either safe or unsafe to take during pregnancy.  Take a prenatal vitamin that contains at least 600 micrograms (mcg) of folic acid.  If you develop constipation, try taking a stool softener if your health care provider approves. Eating and drinking  Eat a balanced diet that includes fresh fruits and vegetables, whole grains, good sources of protein  such as meat, eggs, or tofu, and low-fat dairy. Your health care provider will help you determine the amount of weight gain that is right for you.  Avoid raw meat and uncooked cheese. These carry germs that can cause birth defects in the baby.  If you have low calcium intake from food, talk to your health care provider about whether you should take a daily calcium supplement.  Eat four or five small meals rather than three large meals a day.  Limit foods that are high in fat and processed sugars, such as fried and sweet foods.  To prevent constipation: ? Drink enough fluid to keep your urine clear or pale yellow. ? Eat foods that are high in fiber, such as fresh fruits and vegetables, whole grains, and beans. Activity  Exercise only as directed by your health care provider. Most women can continue their usual exercise routine during pregnancy. Try to exercise for 30 minutes at least 5 days a week. Stop exercising if you experience uterine contractions.  Avoid   heavy lifting.  Do not exercise in extreme heat or humidity, or at high altitudes.  Wear low-heel, comfortable shoes.  Practice good posture.  You may continue to have sex unless your health care provider tells you otherwise. Relieving pain and discomfort  Take frequent breaks and rest with your legs elevated if you have leg cramps or low back pain.  Take warm sitz baths to soothe any pain or discomfort caused by hemorrhoids. Use hemorrhoid cream if your health care provider approves.  Wear a good support bra to prevent discomfort from breast tenderness.  If you develop varicose veins: ? Wear support pantyhose or compression stockings as told by your healthcare provider. ? Elevate your feet for 15 minutes, 3-4 times a day. Prenatal care  Write down your questions. Take them to your prenatal visits.  Keep all your prenatal visits as told by your health care provider. This is important. Safety  Wear your seat belt at  all times when driving.  Make a list of emergency phone numbers, including numbers for family, friends, the hospital, and police and fire departments. General instructions  Avoid cat litter boxes and soil used by cats. These carry germs that can cause birth defects in the baby. If you have a cat, ask someone to clean the litter box for you.  Do not travel far distances unless it is absolutely necessary and only with the approval of your health care provider.  Do not use hot tubs, steam rooms, or saunas.  Do not drink alcohol.  Do not use any products that contain nicotine or tobacco, such as cigarettes and e-cigarettes. If you need help quitting, ask your health care provider.  Do not use any medicinal herbs or unprescribed drugs. These chemicals affect the formation and growth of the baby.  Do not douche or use tampons or scented sanitary pads.  Do not cross your legs for long periods of time.  To prepare for the arrival of your baby: ? Take prenatal classes to understand, practice, and ask questions about labor and delivery. ? Make a trial run to the hospital. ? Visit the hospital and tour the maternity area. ? Arrange for maternity or paternity leave through employers. ? Arrange for family and friends to take care of pets while you are in the hospital. ? Purchase a rear-facing car seat and make sure you know how to install it in your car. ? Pack your hospital bag. ? Prepare the baby's nursery. Make sure to remove all pillows and stuffed animals from the baby's crib to prevent suffocation.  Visit your dentist if you have not gone during your pregnancy. Use a soft toothbrush to brush your teeth and be gentle when you floss. Contact a health care provider if:  You are unsure if you are in labor or if your water has broken.  You become dizzy.  You have mild pelvic cramps, pelvic pressure, or nagging pain in your abdominal area.  You have lower back pain.  You have persistent  nausea, vomiting, or diarrhea.  You have an unusual or bad smelling vaginal discharge.  You have pain when you urinate. Get help right away if:  Your water breaks before 37 weeks.  You have regular contractions less than 5 minutes apart before 37 weeks.  You have a fever.  You are leaking fluid from your vagina.  You have spotting or bleeding from your vagina.  You have severe abdominal pain or cramping.  You have rapid weight loss or weight   gain.  You have shortness of breath with chest pain.  You notice sudden or extreme swelling of your face, hands, ankles, feet, or legs.  Your baby makes fewer than 10 movements in 2 hours.  You have severe headaches that do not go away when you take medicine.  You have vision changes. Summary  The third trimester is from week 28 through week 40, months 7 through 9. The third trimester is a time when the unborn baby (fetus) is growing rapidly.  During the third trimester, your discomfort may increase as you and your baby continue to gain weight. You may have abdominal, leg, and back pain, sleeping problems, and an increased need to urinate.  During the third trimester your breasts will keep growing and they will continue to become tender. A yellow fluid (colostrum) may leak from your breasts. This is the first milk you are producing for your baby.  False labor is a condition in which you feel small, irregular tightenings of the muscles in the womb (contractions) that eventually go away. These are called Braxton Hicks contractions. Contractions may last for hours, days, or even weeks before true labor sets in.  Signs of labor can include: abdominal cramps; regular contractions that start at 10 minutes apart and become stronger and more frequent with time; watery or bloody mucus discharge that comes from the vagina; increased pelvic pressure and dull back pain; and leaking of amniotic fluid. This information is not intended to replace advice  given to you by your health care provider. Make sure you discuss any questions you have with your health care provider. Document Released: 12/01/2001 Document Revised: 05/14/2016 Document Reviewed: 02/07/2013 [employer] Interactive Patient Education  2017 [employer]  Contraception Choices Contraception (birth control) is the use of any methods or devices to prevent pregnancy. Below are some methods to help avoid pregnancy. Hormonal methods  Contraceptive implant. This is a thin, plastic tube containing progesterone hormone. It does not contain estrogen hormone. Your health care provider inserts the tube in the inner part of the upper arm. The tube can remain in place for up to 3 years. After 3 years, the implant must be removed. The implant prevents the ovaries from releasing an egg (ovulation), thickens the cervical mucus to prevent sperm from entering the uterus, and thins the lining of the inside of the uterus.  Progesterone-only injections. These injections are given every 3 months by your health care provider to prevent pregnancy. This synthetic progesterone hormone stops the ovaries from releasing eggs. It also thickens cervical mucus and changes the uterine lining. This makes it harder for sperm to survive in the uterus.  Birth control pills. These pills contain estrogen and progesterone hormone. They work by preventing the ovaries from releasing eggs (ovulation). They also cause the cervical mucus to thicken, preventing the sperm from entering the uterus. Birth control pills are prescribed by a health care provider.Birth control pills can also be used to treat heavy periods.  Minipill. This type of birth control pill contains only the progesterone hormone. They are taken every day of each month and must be prescribed by your health care provider.  Birth control patch. The patch contains hormones similar to those in birth control pills. It must be changed once a week and is prescribed by a  health care provider.  Vaginal ring. The ring contains hormones similar to those in birth control pills. It is left in the vagina for 3 weeks, removed for 1 week, and then a new   one is put back in place. The patient must be comfortable inserting and removing the ring from the vagina.A health care provider's prescription is necessary.  Emergency contraception. Emergency contraceptives prevent pregnancy after unprotected sexual intercourse. This pill can be taken right after sex or up to 5 days after unprotected sex. It is most effective the sooner you take the pills after having sexual intercourse. Most emergency contraceptive pills are available without a prescription. Check with your pharmacist. Do not use emergency contraception as your only form of birth control. Barrier methods  Female condom. This is a thin sheath (latex or rubber) that is worn over the penis during sexual intercourse. It can be used with spermicide to increase effectiveness.  Female condom. This is a soft, loose-fitting sheath that is put into the vagina before sexual intercourse.  Diaphragm. This is a soft, latex, dome-shaped barrier that must be fitted by a health care provider. It is inserted into the vagina, along with a spermicidal jelly. It is inserted before intercourse. The diaphragm should be left in the vagina for 6 to 8 hours after intercourse.  Cervical cap. This is a round, soft, latex or plastic cup that fits over the cervix and must be fitted by a health care provider. The cap can be left in place for up to 48 hours after intercourse.  Sponge. This is a soft, circular piece of polyurethane foam. The sponge has spermicide in it. It is inserted into the vagina after wetting it and before sexual intercourse.  Spermicides. These are chemicals that kill or block sperm from entering the cervix and uterus. They come in the form of creams, jellies, suppositories, foam, or tablets. They do not require a prescription. They  are inserted into the vagina with an applicator before having sexual intercourse. The process must be repeated every time you have sexual intercourse. Intrauterine contraception  Intrauterine device (IUD). This is a T-shaped device that is put in a woman's uterus during a menstrual period to prevent pregnancy. There are 2 types: ? Copper IUD. This type of IUD is wrapped in copper wire and is placed inside the uterus. Copper makes the uterus and fallopian tubes produce a fluid that kills sperm. It can stay in place for 10 years. ? Hormone IUD. This type of IUD contains the hormone progestin (synthetic progesterone). The hormone thickens the cervical mucus and prevents sperm from entering the uterus, and it also thins the uterine lining to prevent implantation of a fertilized egg. The hormone can weaken or kill the sperm that get into the uterus. It can stay in place for 3-5 years, depending on which type of IUD is used. Permanent methods of contraception  Female tubal ligation. This is when the woman's fallopian tubes are surgically sealed, tied, or blocked to prevent the egg from traveling to the uterus.  Hysteroscopic sterilization. This involves placing a small coil or insert into each fallopian tube. Your doctor uses a technique called hysteroscopy to do the procedure. The device causes scar tissue to form. This results in permanent blockage of the fallopian tubes, so the sperm cannot fertilize the egg. It takes about 3 months after the procedure for the tubes to become blocked. You must use another form of birth control for these 3 months.  Female sterilization. This is when the female has the tubes that carry sperm tied off (vasectomy).This blocks sperm from entering the vagina during sexual intercourse. After the procedure, the man can still ejaculate fluid (semen). Natural planning methods    Natural family planning. This is not having sexual intercourse or using a barrier method (condom, diaphragm,  cervical cap) on days the woman could become pregnant.  Calendar method. This is keeping track of the length of each menstrual cycle and identifying when you are fertile.  Ovulation method. This is avoiding sexual intercourse during ovulation.  Symptothermal method. This is avoiding sexual intercourse during ovulation, using a thermometer and ovulation symptoms.  Post-ovulation method. This is timing sexual intercourse after you have ovulated. Regardless of which type or method of contraception you choose, it is important that you use condoms to protect against the transmission of sexually transmitted infections (STIs). Talk with your health care provider about which form of contraception is most appropriate for you. This information is not intended to replace advice given to you by your health care provider. Make sure you discuss any questions you have with your health care provider. Document Released: 12/07/2005 Document Revised: 05/14/2016 Document Reviewed: 06/01/2013 [employer] Interactive Patient Education  2017 [employer]   Breastfeeding Deciding to breastfeed is one of the best choices you can make for you and your baby. A change in hormones during pregnancy causes your breast tissue to grow and increases the number and size of your milk ducts. These hormones also allow proteins, sugars, and fats from your blood supply to make breast milk in your milk-producing glands. Hormones prevent breast milk from being released before your baby is born as well as prompt milk flow after birth. Once breastfeeding has begun, thoughts of your baby, as well as his or her sucking or crying, can stimulate the release of milk from your milk-producing glands. Benefits of breastfeeding For Your Baby  Your first milk (colostrum) helps your baby's digestive system function better.  There are antibodies in your milk that help your baby fight off infections.  Your baby has a lower incidence of asthma,  allergies, and sudden infant death syndrome.  The nutrients in breast milk are better for your baby than infant formulas and are designed uniquely for your baby's needs.  Breast milk improves your baby's brain development.  Your baby is less likely to develop other conditions, such as childhood obesity, asthma, or type 2 diabetes mellitus.  For You  Breastfeeding helps to create a very special bond between you and your baby.  Breastfeeding is convenient. Breast milk is always available at the correct temperature and costs nothing.  Breastfeeding helps to burn calories and helps you lose the weight gained during pregnancy.  Breastfeeding makes your uterus contract to its prepregnancy size faster and slows bleeding (lochia) after you give birth.  Breastfeeding helps to lower your risk of developing type 2 diabetes mellitus, osteoporosis, and breast or ovarian cancer later in life.  Signs that your baby is hungry Early Signs of Hunger  Increased alertness or activity.  Stretching.  Movement of the head from side to side.  Movement of the head and opening of the mouth when the corner of the mouth or cheek is stroked (rooting).  Increased sucking sounds, smacking lips, cooing, sighing, or squeaking.  Hand-to-mouth movements.  Increased sucking of fingers or hands.  Late Signs of Hunger  Fussing.  Intermittent crying.  Extreme Signs of Hunger Signs of extreme hunger will require calming and consoling before your baby will be able to breastfeed successfully. Do not wait for the following signs of extreme hunger to occur before you initiate breastfeeding:  Restlessness.  A loud, strong cry.  Screaming.  Breastfeeding basics Breastfeeding Initiation    Find a comfortable place to sit or lie down, with your neck and back well supported.  Place a pillow or rolled up blanket under your baby to bring him or her to the level of your breast (if you are seated). Nursing pillows  are specially designed to help support your arms and your baby while you breastfeed.  Make sure that your baby's abdomen is facing your abdomen.  Gently massage your breast. With your fingertips, massage from your chest wall toward your nipple in a circular motion. This encourages milk flow. You may need to continue this action during the feeding if your milk flows slowly.  Support your breast with 4 fingers underneath and your thumb above your nipple. Make sure your fingers are well away from your nipple and your baby's mouth.  Stroke your baby's lips gently with your finger or nipple.  When your baby's mouth is open wide enough, quickly bring your baby to your breast, placing your entire nipple and as much of the colored area around your nipple (areola) as possible into your baby's mouth. ? More areola should be visible above your baby's upper lip than below the lower lip. ? Your baby's tongue should be between his or her lower gum and your breast.  Ensure that your baby's mouth is correctly positioned around your nipple (latched). Your baby's lips should create a seal on your breast and be turned out (everted).  It is common for your baby to suck about 2-3 minutes in order to start the flow of breast milk.  Latching Teaching your baby how to latch on to your breast properly is very important. An improper latch can cause nipple pain and decreased milk supply for you and poor weight gain in your baby. Also, if your baby is not latched onto your nipple properly, he or she may swallow some air during feeding. This can make your baby fussy. Burping your baby when you switch breasts during the feeding can help to get rid of the air. However, teaching your baby to latch on properly is still the best way to prevent fussiness from swallowing air while breastfeeding. Signs that your baby has successfully latched on to your nipple:  Silent tugging or silent sucking, without causing you  pain.  Swallowing heard between every 3-4 sucks.  Muscle movement above and in front of his or her ears while sucking.  Signs that your baby has not successfully latched on to nipple:  Sucking sounds or smacking sounds from your baby while breastfeeding.  Nipple pain.  If you think your baby has not latched on correctly, slip your finger into the corner of your baby's mouth to break the suction and place it between your baby's gums. Attempt breastfeeding initiation again. Signs of Successful Breastfeeding Signs from your baby:  A gradual decrease in the number of sucks or complete cessation of sucking.  Falling asleep.  Relaxation of his or her body.  Retention of a small amount of milk in his or her mouth.  Letting go of your breast by himself or herself.  Signs from you:  Breasts that have increased in firmness, weight, and size 1-3 hours after feeding.  Breasts that are softer immediately after breastfeeding.  Increased milk volume, as well as a change in milk consistency and color by the fifth day of breastfeeding.  Nipples that are not sore, cracked, or bleeding.  Signs That Your Baby is Getting Enough Milk  Wetting at least 1-2 diapers during the first 24   hours after birth.  Wetting at least 5-6 diapers every 24 hours for the first week after birth. The urine should be clear or pale yellow by 5 days after birth.  Wetting 6-8 diapers every 24 hours as your baby continues to grow and develop.  At least 3 stools in a 24-hour period by age 5 days. The stool should be soft and yellow.  At least 3 stools in a 24-hour period by age 7 days. The stool should be seedy and yellow.  No loss of weight greater than 10% of birth weight during the first 3 days of age.  Average weight gain of 4-7 ounces (113-198 g) per week after age 4 days.  Consistent daily weight gain by age 5 days, without weight loss after the age of 2 weeks.  After a feeding, your baby may spit up a  small amount. This is common. Breastfeeding frequency and duration Frequent feeding will help you make more milk and can prevent sore nipples and breast engorgement. Breastfeed when you feel the need to reduce the fullness of your breasts or when your baby shows signs of hunger. This is called "breastfeeding on demand." Avoid introducing a pacifier to your baby while you are working to establish breastfeeding (the first 4-6 weeks after your baby is born). After this time you may choose to use a pacifier. Research has shown that pacifier use during the first year of a baby's life decreases the risk of sudden infant death syndrome (SIDS). Allow your baby to feed on each breast as long as he or she wants. Breastfeed until your baby is finished feeding. When your baby unlatches or falls asleep while feeding from the first breast, offer the second breast. Because newborns are often sleepy in the first few weeks of life, you may need to awaken your baby to get him or her to feed. Breastfeeding times will vary from baby to baby. However, the following rules can serve as a guide to help you ensure that your baby is properly fed:  Newborns (babies 4 weeks of age or younger) may breastfeed every 1-3 hours.  Newborns should not go longer than 3 hours during the day or 5 hours during the night without breastfeeding.  You should breastfeed your baby a minimum of 8 times in a 24-hour period until you begin to introduce solid foods to your baby at around 6 months of age.  Breast milk pumping Pumping and storing breast milk allows you to ensure that your baby is exclusively fed your breast milk, even at times when you are unable to breastfeed. This is especially important if you are going back to work while you are still breastfeeding or when you are not able to be present during feedings. Your lactation consultant can give you guidelines on how long it is safe to store breast milk. A breast pump is a machine that  allows you to pump milk from your breast into a sterile bottle. The pumped breast milk can then be stored in a refrigerator or freezer. Some breast pumps are operated by hand, while others use electricity. Ask your lactation consultant which type will work best for you. Breast pumps can be purchased, but some hospitals and breastfeeding support groups lease breast pumps on a monthly basis. A lactation consultant can teach you how to hand express breast milk, if you prefer not to use a pump. Caring for your breasts while you breastfeed Nipples can become dry, cracked, and sore while breastfeeding. The following   recommendations can help keep your breasts moisturized and healthy:  Avoid using soap on your nipples.  Wear a supportive bra. Although not required, special nursing bras and tank tops are designed to allow access to your breasts for breastfeeding without taking off your entire bra or top. Avoid wearing underwire-style bras or extremely tight bras.  Air dry your nipples for 3-4minutes after each feeding.  Use only cotton bra pads to absorb leaked breast milk. Leaking of breast milk between feedings is normal.  Use lanolin on your nipples after breastfeeding. Lanolin helps to maintain your skin's normal moisture barrier. If you use pure lanolin, you do not need to wash it off before feeding your baby again. Pure lanolin is not toxic to your baby. You may also hand express a few drops of breast milk and gently massage that milk into your nipples and allow the milk to air dry.  In the first few weeks after giving birth, some women experience extremely full breasts (engorgement). Engorgement can make your breasts feel heavy, warm, and tender to the touch. Engorgement peaks within 3-5 days after you give birth. The following recommendations can help ease engorgement:  Completely empty your breasts while breastfeeding or pumping. You may want to start by applying warm, moist heat (in the shower or  with warm water-soaked hand towels) just before feeding or pumping. This increases circulation and helps the milk flow. If your baby does not completely empty your breasts while breastfeeding, pump any extra milk after he or she is finished.  Wear a snug bra (nursing or regular) or tank top for 1-2 days to signal your body to slightly decrease milk production.  Apply ice packs to your breasts, unless this is too uncomfortable for you.  Make sure that your baby is latched on and positioned properly while breastfeeding.  If engorgement persists after 48 hours of following these recommendations, contact your health care provider or a lactation consultant. Overall health care recommendations while breastfeeding  Eat healthy foods. Alternate between meals and snacks, eating 3 of each per day. Because what you eat affects your breast milk, some of the foods may make your baby more irritable than usual. Avoid eating these foods if you are sure that they are negatively affecting your baby.  Drink milk, fruit juice, and water to satisfy your thirst (about 10 glasses a day).  Rest often, relax, and continue to take your prenatal vitamins to prevent fatigue, stress, and anemia.  Continue breast self-awareness checks.  Avoid chewing and smoking tobacco. Chemicals from cigarettes that pass into breast milk and exposure to secondhand smoke may harm your baby.  Avoid alcohol and drug use, including marijuana. Some medicines that may be harmful to your baby can pass through breast milk. It is important to ask your health care provider before taking any medicine, including all over-the-counter and prescription medicine as well as vitamin and herbal supplements. It is possible to become pregnant while breastfeeding. If birth control is desired, ask your health care provider about options that will be safe for your baby. Contact a health care provider if:  You feel like you want to stop breastfeeding or have  become frustrated with breastfeeding.  You have painful breasts or nipples.  Your nipples are cracked or bleeding.  Your breasts are red, tender, or warm.  You have a swollen area on either breast.  You have a fever or chills.  You have nausea or vomiting.  You have drainage other than breast milk from   your nipples.  Your breasts do not become full before feedings by the fifth day after you give birth.  You feel sad and depressed.  Your baby is too sleepy to eat well.  Your baby is having trouble sleeping.  Your baby is wetting less than 3 diapers in a 24-hour period.  Your baby has less than 3 stools in a 24-hour period.  Your baby's skin or the white part of his or her eyes becomes yellow.  Your baby is not gaining weight by 5 days of age. Get help right away if:  Your baby is overly tired (lethargic) and does not want to wake up and feed.  Your baby develops an unexplained fever. This information is not intended to replace advice given to you by your health care provider. Make sure you discuss any questions you have with your health care provider. Document Released: 12/07/2005 Document Revised: 05/20/2016 Document Reviewed: 05/31/2013 Elsevier Interactive Patient Education  2017 [employer]  

## 2017-07-21 ENCOUNTER — Encounter (HOSPITAL_COMMUNITY): Payer: Self-pay

## 2017-07-21 LAB — OBSTETRIC PANEL, INCLUDING HIV
Antibody Screen: NEGATIVE
BASOS ABS: 0 10*3/uL (ref 0.0–0.2)
Basos: 0 %
EOS (ABSOLUTE): 0.2 10*3/uL (ref 0.0–0.4)
Eos: 2 %
HEP B S AG: NEGATIVE
HIV SCREEN 4TH GENERATION: NONREACTIVE
Hematocrit: 33.8 % — ABNORMAL LOW (ref 34.0–46.6)
Hemoglobin: 10.9 g/dL — ABNORMAL LOW (ref 11.1–15.9)
IMMATURE GRANULOCYTES: 0 %
Immature Grans (Abs): 0 10*3/uL (ref 0.0–0.1)
LYMPHS ABS: 2.4 10*3/uL (ref 0.7–3.1)
Lymphs: 23 %
MCH: 28.3 pg (ref 26.6–33.0)
MCHC: 32.2 g/dL (ref 31.5–35.7)
MCV: 88 fL (ref 79–97)
MONOS ABS: 0.7 10*3/uL (ref 0.1–0.9)
Monocytes: 7 %
NEUTROS ABS: 6.7 10*3/uL (ref 1.4–7.0)
NEUTROS PCT: 68 %
PLATELETS: 288 10*3/uL (ref 150–379)
RBC: 3.85 x10E6/uL (ref 3.77–5.28)
RDW: 16.7 % — AB (ref 12.3–15.4)
RPR Ser Ql: NONREACTIVE
Rh Factor: POSITIVE
Rubella Antibodies, IGG: 2.71 index (ref >0.99)
WBC: 10.1 10*3/uL (ref 3.4–10.8)

## 2017-07-21 LAB — CULTURE, BETA STREP (GROUP B ONLY)

## 2017-07-21 LAB — HEMOGLOBIN A1C
ESTIMATED AVERAGE GLUCOSE: 120 mg/dL
HEMOGLOBIN A1C: 5.8 % — AB (ref 4.8–5.6)

## 2017-07-22 ENCOUNTER — Encounter (HOSPITAL_COMMUNITY): Payer: Self-pay

## 2017-07-22 ENCOUNTER — Other Ambulatory Visit: Payer: Self-pay | Admitting: Obstetrics and Gynecology

## 2017-07-23 ENCOUNTER — Encounter (HOSPITAL_COMMUNITY)
Admission: RE | Admit: 2017-07-23 | Discharge: 2017-07-23 | Disposition: A | Discharge status: Home / Self Care | Payer: Self-pay | Source: Ambulatory Visit

## 2017-07-23 LAB — URINE CULTURE, OB REFLEX

## 2017-07-23 LAB — CULTURE, OB URINE

## 2017-07-23 NOTE — Pre-Procedure Instructions (Signed)
Pt did not show for preop appointment.  Called pt and she stated she was unable to get a ride for today, but would be here for surgery tomorrow morning.  Instructed her to arrive at 9:00 tomorrow morning.  Do not eat or drink anything after midnight.  Stressed importance of arriving on time so all blood work can be completed for surgery.

## 2017-07-23 NOTE — Patient Instructions (Signed)
20 Anne BlazerChiquita J Richardson  07/23/2017   Your procedure is scheduled on:  07/21/2017  Enter through the Main Entrance of [facility] at [facility]30 AM.  Pick up the phone at the desk and dial [phone].   Call this number if you have problems the morning of surgery: [phone]   Remember:   Do not eat food:After Midnight.  Do not drink clear liquids: After Midnight.  Take these medicines the morning of surgery with A SIP OF WATER: none   Do not wear jewelry, make-up or nail polish.  Do not wear lotions, powders, or perfumes. Do not wear deodorant.  Do not shave 48 hours prior to surgery.  Do not bring valuables to the hospital.  [facility] not   responsible for any belongings or valuables brought to the hospital.  Contacts, dentures or bridgework may not be worn into surgery.  Leave suitcase in the car. After surgery it may be brought to your room.  For patients admitted to the hospital, checkout time is 11:00 AM the day of              discharge.   Patients discharged the day of surgery will not be allowed to drive             home.  Name and phone number of your driver: na  Special Instructions:   N/A   Please read over the following fact sheets that you were given:   Surgical Site Infection Prevention

## 2017-07-24 ENCOUNTER — Inpatient Hospital Stay (HOSPITAL_COMMUNITY): Payer: Self-pay | Admitting: Anesthesiology

## 2017-07-24 ENCOUNTER — Encounter (HOSPITAL_COMMUNITY): Payer: Self-pay | Admitting: *Deleted

## 2017-07-24 ENCOUNTER — Encounter (HOSPITAL_COMMUNITY)
Admission: RE | Disposition: A | Discharge status: Home / Self Care | Payer: Self-pay | Source: Ambulatory Visit | Attending: Obstetrics and Gynecology

## 2017-07-24 ENCOUNTER — Inpatient Hospital Stay (HOSPITAL_COMMUNITY)
Admission: RE | Admit: 2017-07-24 | Discharge: 2017-07-27 | DRG: 765 | Disposition: A | Discharge status: Home / Self Care | Payer: Self-pay | Source: Ambulatory Visit | Attending: Obstetrics and Gynecology | Admitting: Obstetrics and Gynecology

## 2017-07-24 DIAGNOSIS — Z880 Allergy status to penicillin: Secondary | ICD-10-CM

## 2017-07-24 DIAGNOSIS — O99214 Obesity complicating childbirth: Secondary | ICD-10-CM | POA: Diagnosis present

## 2017-07-24 DIAGNOSIS — O34211 Maternal care for low transverse scar from previous cesarean delivery: Secondary | ICD-10-CM

## 2017-07-24 DIAGNOSIS — O9902 Anemia complicating childbirth: Secondary | ICD-10-CM | POA: Diagnosis present

## 2017-07-24 DIAGNOSIS — Z3A39 39 weeks gestation of pregnancy: Secondary | ICD-10-CM

## 2017-07-24 DIAGNOSIS — O99824 Streptococcus B carrier state complicating childbirth: Secondary | ICD-10-CM | POA: Diagnosis present

## 2017-07-24 DIAGNOSIS — D649 Anemia, unspecified: Secondary | ICD-10-CM | POA: Diagnosis present

## 2017-07-24 DIAGNOSIS — Z98891 History of uterine scar from previous surgery: Secondary | ICD-10-CM

## 2017-07-24 DIAGNOSIS — Z3483 Encounter for supervision of other normal pregnancy, third trimester: Secondary | ICD-10-CM

## 2017-07-24 DIAGNOSIS — Z6841 Body Mass Index (BMI) 40.0 and over, adult: Secondary | ICD-10-CM

## 2017-07-24 LAB — TYPE AND SCREEN
ABO/RH(D): B POS
Antibody Screen: NEGATIVE

## 2017-07-24 LAB — CBC
HEMATOCRIT: 32.3 % — AB (ref 36.0–46.0)
HEMOGLOBIN: 10.9 g/dL — AB (ref 12.0–15.0)
MCH: 29.5 pg (ref 26.0–34.0)
MCHC: 33.7 g/dL (ref 30.0–36.0)
MCV: 87.3 fL (ref 78.0–100.0)
PLATELETS: 284 10*3/uL (ref 150–400)
RBC: 3.7 MIL/uL — AB (ref 3.87–5.11)
RDW: 16 % — ABNORMAL HIGH (ref 11.5–15.5)
WBC: 11.3 10*3/uL — AB (ref 4.0–10.5)

## 2017-07-24 SURGERY — Surgical Case
Anesthesia: Spinal | Site: Abdomen | Wound class: Clean Contaminated

## 2017-07-24 MED ORDER — DEXAMETHASONE SODIUM PHOSPHATE 4 MG/ML IJ SOLN
INTRAMUSCULAR | Status: DC | PRN
  Administered 2017-07-24: 4 mg via INTRAVENOUS

## 2017-07-24 MED ORDER — ONDANSETRON HCL 4 MG/2ML IJ SOLN
INTRAMUSCULAR | Status: DC | PRN
  Administered 2017-07-24: 4 mg via INTRAVENOUS

## 2017-07-24 MED ORDER — ACETAMINOPHEN 325 MG PO TABS
650.0000 mg | ORAL_TABLET | ORAL | Status: DC | PRN
  Administered 2017-07-24 – 2017-07-27 (×3): 650 mg via ORAL
  Filled 2017-07-24 (×3): qty 2

## 2017-07-24 MED ORDER — MORPHINE SULFATE (PF) 0.5 MG/ML IJ SOLN
INTRAMUSCULAR | Status: DC | PRN
  Administered 2017-07-24: .2 mg via INTRATHECAL

## 2017-07-24 MED ORDER — SENNOSIDES-DOCUSATE SODIUM 8.6-50 MG PO TABS
2.0000 | ORAL_TABLET | ORAL | Status: DC
  Administered 2017-07-24 – 2017-07-26 (×3): 2 via ORAL
  Filled 2017-07-24 (×3): qty 2

## 2017-07-24 MED ORDER — LACTATED RINGERS IV SOLN
INTRAVENOUS | Status: DC
  Administered 2017-07-24 – 2017-07-25 (×2): via INTRAVENOUS

## 2017-07-24 MED ORDER — GENTAMICIN SULFATE 40 MG/ML IJ SOLN
INTRAVENOUS | Status: AC
  Administered 2017-07-24: 116.25 mL via INTRAVENOUS
  Filled 2017-07-24: qty 10.25

## 2017-07-24 MED ORDER — METOCLOPRAMIDE HCL 5 MG/ML IJ SOLN
INTRAMUSCULAR | Status: AC
  Filled 2017-07-24: qty 2

## 2017-07-24 MED ORDER — LACTATED RINGERS IV SOLN
INTRAVENOUS | Status: DC | PRN
  Administered 2017-07-24: 40 [IU] via INTRAVENOUS

## 2017-07-24 MED ORDER — SIMETHICONE 80 MG PO CHEW
80.0000 mg | CHEWABLE_TABLET | ORAL | Status: DC
  Administered 2017-07-24 – 2017-07-26 (×3): 80 mg via ORAL
  Filled 2017-07-24 (×3): qty 1

## 2017-07-24 MED ORDER — ONDANSETRON HCL 4 MG/2ML IJ SOLN
INTRAMUSCULAR | Status: AC
  Filled 2017-07-24: qty 2

## 2017-07-24 MED ORDER — CLINDAMYCIN PHOSPHATE 900 MG/50ML IV SOLN
900.0000 mg | INTRAVENOUS | Status: DC
  Filled 2017-07-24: qty 50

## 2017-07-24 MED ORDER — DIBUCAINE 1 % RE OINT: 1.0000 "application " | TOPICAL_OINTMENT | RECTAL | Status: DC | PRN

## 2017-07-24 MED ORDER — SIMETHICONE 80 MG PO CHEW
80.0000 mg | CHEWABLE_TABLET | Freq: Three times a day (TID) | ORAL | Status: DC
  Administered 2017-07-25 – 2017-07-27 (×7): 80 mg via ORAL
  Filled 2017-07-24 (×5): qty 1

## 2017-07-24 MED ORDER — BUPIVACAINE IN DEXTROSE 0.75-8.25 % IT SOLN
INTRATHECAL | Status: DC | PRN
Start: 2017-07-24 — End: 2017-07-24
  Administered 2017-07-24: 1.6 mL via INTRATHECAL

## 2017-07-24 MED ORDER — SOD CITRATE-CITRIC ACID 500-334 MG/5ML PO SOLN: 30.0000 mL | ORAL | Status: DC

## 2017-07-24 MED ORDER — PHENYLEPHRINE 8 MG IN D5W 100 ML (0.08MG/ML) PREMIX OPTIME
INJECTION | INTRAVENOUS | Status: DC | PRN
  Administered 2017-07-24: 60 ug/min via INTRAVENOUS

## 2017-07-24 MED ORDER — PHENYLEPHRINE 8 MG IN D5W 100 ML (0.08MG/ML) PREMIX OPTIME
INJECTION | INTRAVENOUS | Status: AC
  Filled 2017-07-24: qty 100

## 2017-07-24 MED ORDER — LACTATED RINGERS IV SOLN
INTRAVENOUS | Status: DC | PRN
  Administered 2017-07-24: 11:00:00 via INTRAVENOUS

## 2017-07-24 MED ORDER — SCOPOLAMINE 1 MG/3DAYS TD PT72
MEDICATED_PATCH | TRANSDERMAL | Status: AC
  Filled 2017-07-24: qty 1

## 2017-07-24 MED ORDER — OXYCODONE HCL 5 MG PO TABS
5.0000 mg | ORAL_TABLET | ORAL | Status: DC | PRN
  Administered 2017-07-25 – 2017-07-26 (×4): 5 mg via ORAL
  Filled 2017-07-24 (×4): qty 1

## 2017-07-24 MED ORDER — OXYTOCIN 40 UNITS IN LACTATED RINGERS INFUSION - SIMPLE MED: 2.5000 [IU]/h | INTRAVENOUS | Status: AC

## 2017-07-24 MED ORDER — DIPHENHYDRAMINE HCL 50 MG/ML IJ SOLN
12.5000 mg | INTRAMUSCULAR | Status: DC | PRN
  Administered 2017-07-24: 12.5 mg via INTRAVENOUS

## 2017-07-24 MED ORDER — MENTHOL 3 MG MT LOZG: 1.0000 | LOZENGE | OROMUCOSAL | Status: DC | PRN

## 2017-07-24 MED ORDER — DIPHENHYDRAMINE HCL 50 MG/ML IJ SOLN
INTRAMUSCULAR | Status: AC
  Filled 2017-07-24: qty 1

## 2017-07-24 MED ORDER — FENTANYL CITRATE (PF) 100 MCG/2ML IJ SOLN
INTRAMUSCULAR | Status: DC | PRN
  Administered 2017-07-24: 10 ug via INTRATHECAL

## 2017-07-24 MED ORDER — CIPROFLOXACIN IN D5W 400 MG/200ML IV SOLN
400.0000 mg | INTRAVENOUS | Status: DC
  Filled 2017-07-24: qty 200

## 2017-07-24 MED ORDER — DIPHENHYDRAMINE HCL 25 MG PO CAPS
25.0000 mg | ORAL_CAPSULE | Freq: Four times a day (QID) | ORAL | Status: DC | PRN
  Administered 2017-07-24 – 2017-07-25 (×2): 25 mg via ORAL
  Filled 2017-07-24 (×2): qty 1

## 2017-07-24 MED ORDER — WITCH HAZEL-GLYCERIN EX PADS: 1.0000 "application " | MEDICATED_PAD | CUTANEOUS | Status: DC | PRN

## 2017-07-24 MED ORDER — KETOROLAC TROMETHAMINE 30 MG/ML IJ SOLN
30.0000 mg | Freq: Four times a day (QID) | INTRAMUSCULAR | Status: AC
  Administered 2017-07-24 – 2017-07-25 (×2): 30 mg via INTRAVENOUS
  Filled 2017-07-24 (×2): qty 1

## 2017-07-24 MED ORDER — MORPHINE SULFATE (PF) 0.5 MG/ML IJ SOLN
INTRAMUSCULAR | Status: AC
  Filled 2017-07-24: qty 10

## 2017-07-24 MED ORDER — OXYTOCIN 10 UNIT/ML IJ SOLN
INTRAMUSCULAR | Status: AC
  Filled 2017-07-24: qty 4

## 2017-07-24 MED ORDER — CHLOROPROCAINE HCL (PF) 3 % IJ SOLN
INTRAMUSCULAR | Status: AC
  Filled 2017-07-24: qty 20

## 2017-07-24 MED ORDER — PRENATAL MULTIVITAMIN CH
1.0000 | ORAL_TABLET | Freq: Every day | ORAL | Status: DC
  Administered 2017-07-25 – 2017-07-26 (×2): 1 via ORAL
  Filled 2017-07-24 (×2): qty 1

## 2017-07-24 MED ORDER — IBUPROFEN 600 MG PO TABS
600.0000 mg | ORAL_TABLET | Freq: Four times a day (QID) | ORAL | Status: DC
  Administered 2017-07-25 – 2017-07-27 (×8): 600 mg via ORAL
  Filled 2017-07-24 (×8): qty 1

## 2017-07-24 MED ORDER — FENTANYL CITRATE (PF) 100 MCG/2ML IJ SOLN: 25.0000 ug | INTRAMUSCULAR | Status: DC | PRN

## 2017-07-24 MED ORDER — SCOPOLAMINE 1 MG/3DAYS TD PT72
MEDICATED_PATCH | TRANSDERMAL | Status: DC | PRN
  Administered 2017-07-24: 1 via TRANSDERMAL

## 2017-07-24 MED ORDER — KETOROLAC TROMETHAMINE 30 MG/ML IJ SOLN
INTRAMUSCULAR | Status: AC
  Filled 2017-07-24: qty 1

## 2017-07-24 MED ORDER — TETANUS-DIPHTH-ACELL PERTUSSIS 5-2.5-18.5 LF-MCG/0.5 IM SUSP
0.5000 mL | Freq: Once | INTRAMUSCULAR | Status: AC
  Administered 2017-07-25: 0.5 mL via INTRAMUSCULAR
  Filled 2017-07-24: qty 0.5

## 2017-07-24 MED ORDER — LACTATED RINGERS IV SOLN
INTRAVENOUS | Status: DC
  Administered 2017-07-24 (×2): via INTRAVENOUS

## 2017-07-24 MED ORDER — OXYCODONE HCL 5 MG PO TABS
10.0000 mg | ORAL_TABLET | ORAL | Status: DC | PRN
  Administered 2017-07-26 – 2017-07-27 (×3): 10 mg via ORAL
  Filled 2017-07-24 (×3): qty 2

## 2017-07-24 MED ORDER — COCONUT OIL OIL: 1.0000 "application " | TOPICAL_OIL | Status: DC | PRN

## 2017-07-24 MED ORDER — BUPIVACAINE IN DEXTROSE 0.75-8.25 % IT SOLN
INTRATHECAL | Status: AC
  Filled 2017-07-24: qty 2

## 2017-07-24 MED ORDER — ZOLPIDEM TARTRATE 5 MG PO TABS: 5.0000 mg | ORAL_TABLET | Freq: Every evening | ORAL | Status: DC | PRN

## 2017-07-24 MED ORDER — KETOROLAC TROMETHAMINE 30 MG/ML IJ SOLN
30.0000 mg | Freq: Four times a day (QID) | INTRAMUSCULAR | Status: AC | PRN
  Administered 2017-07-24: 30 mg via INTRAVENOUS

## 2017-07-24 MED ORDER — FENTANYL CITRATE (PF) 100 MCG/2ML IJ SOLN
INTRAMUSCULAR | Status: AC
  Filled 2017-07-24: qty 2

## 2017-07-24 MED ORDER — DEXAMETHASONE SODIUM PHOSPHATE 10 MG/ML IJ SOLN
INTRAMUSCULAR | Status: AC
  Filled 2017-07-24: qty 1

## 2017-07-24 MED ORDER — SIMETHICONE 80 MG PO CHEW: 80.0000 mg | CHEWABLE_TABLET | ORAL | Status: DC | PRN

## 2017-07-24 SURGICAL SUPPLY — 41 items
APL SKNCLS STERI-STRIP NONHPOA (GAUZE/BANDAGES/DRESSINGS) ×1
BENZOIN TINCTURE PRP APPL 2/3 (GAUZE/BANDAGES/DRESSINGS) ×3 IMPLANT
CHLORAPREP W/TINT 26ML (MISCELLANEOUS) ×3 IMPLANT
CLAMP CORD UMBIL (MISCELLANEOUS) IMPLANT
CLOSURE WOUND 1/2 X4 (GAUZE/BANDAGES/DRESSINGS) ×1
CLOTH BEACON ORANGE TIMEOUT ST (SAFETY) ×3 IMPLANT
DRAPE C SECTION CLR SCREEN (DRAPES) IMPLANT
DRSG OPSITE POSTOP 4X10 (GAUZE/BANDAGES/DRESSINGS) ×3 IMPLANT
ELECT REM PT RETURN 9FT ADLT (ELECTROSURGICAL) ×3
ELECTRODE REM PT RTRN 9FT ADLT (ELECTROSURGICAL) ×1 IMPLANT
EXTRACTOR VACUUM M CUP 4 TUBE (SUCTIONS) IMPLANT
EXTRACTOR VACUUM M CUP 4' TUBE (SUCTIONS)
GLOVE BIO SURGEON STRL SZ7.5 (GLOVE) ×3 IMPLANT
GLOVE BIOGEL PI IND STRL 7.0 (GLOVE) ×1 IMPLANT
GLOVE BIOGEL PI INDICATOR 7.0 (GLOVE) ×2
GOWN STRL REUS W/TWL 2XL LVL3 (GOWN DISPOSABLE) ×3 IMPLANT
GOWN STRL REUS W/TWL LRG LVL3 (GOWN DISPOSABLE) ×6 IMPLANT
KIT ABG SYR 3ML LUER SLIP (SYRINGE) IMPLANT
NDL HYPO 25X5/8 SAFETYGLIDE (NEEDLE) IMPLANT
NEEDLE HYPO 22GX1.5 SAFETY (NEEDLE) ×3 IMPLANT
NEEDLE HYPO 25X5/8 SAFETYGLIDE (NEEDLE) IMPLANT
NS IRRIG 1000ML POUR BTL (IV SOLUTION) ×3 IMPLANT
PACK C SECTION WH (CUSTOM PROCEDURE TRAY) ×3 IMPLANT
PAD OB MATERNITY 4.3X12.25 (PERSONAL CARE ITEMS) ×3 IMPLANT
PENCIL SMOKE EVAC W/HOLSTER (ELECTROSURGICAL) ×3 IMPLANT
RTRCTR C-SECT PINK 25CM LRG (MISCELLANEOUS) ×3 IMPLANT
STRIP CLOSURE SKIN 1/2X4 (GAUZE/BANDAGES/DRESSINGS) ×2 IMPLANT
SUT CHROMIC 1 CTX 36 (SUTURE) ×6 IMPLANT
SUT VIC AB 1 CT1 27 (SUTURE) ×6
SUT VIC AB 1 CT1 27XBRD ANTBC (SUTURE) ×2 IMPLANT
SUT VIC AB 2-0 CT1 (SUTURE) ×3 IMPLANT
SUT VIC AB 2-0 CT1 27 (SUTURE) ×3
SUT VIC AB 2-0 CT1 TAPERPNT 27 (SUTURE) ×1 IMPLANT
SUT VIC AB 3-0 CT1 27 (SUTURE) ×6
SUT VIC AB 3-0 CT1 TAPERPNT 27 (SUTURE) ×2 IMPLANT
SUT VIC AB 3-0 SH 27 (SUTURE)
SUT VIC AB 3-0 SH 27X BRD (SUTURE) IMPLANT
SUT VIC AB 4-0 KS 27 (SUTURE) ×3 IMPLANT
SYR BULB IRRIGATION 50ML (SYRINGE) IMPLANT
TOWEL OR 17X24 6PK STRL BLUE (TOWEL DISPOSABLE) ×3 IMPLANT
TRAY FOLEY BAG SILVER LF 14FR (SET/KITS/TRAYS/PACK) ×3 IMPLANT

## 2017-07-24 NOTE — Anesthesia Postprocedure Evaluation (Signed)
Anesthesia Post Note  Patient: Anne Richardson  Procedure(s) Performed: Procedure(s) (LRB): CESAREAN SECTION (N/A)     Patient location during evaluation: PACU Anesthesia Type: Spinal Level of consciousness: awake and alert Pain management: pain level controlled Vital Signs Assessment: post-procedure vital signs reviewed and stable Respiratory status: spontaneous breathing and respiratory function stable Cardiovascular status: blood pressure returned to baseline and stable Postop Assessment: spinal receding Anesthetic complications: no    Last Vitals:  Vitals:   07/24/17 1315 07/24/17 1330  BP: 133/83 129/79  Pulse: 63 (!) 58  Resp: 14 16  Temp: 36.4 C     Last Pain:  Vitals:   07/24/17 1315  TempSrc: Oral  PainSc: 3    Pain Goal: Patients Stated Pain Goal: 3 (07/24/17 1315)               Kennieth RadFitzgerald, Isami Mehra E

## 2017-07-24 NOTE — Op Note (Signed)
Cesarean Section Operative Report  Anne BlazerChiquita J Popson  PROCEDURE DATE: 07/24/2017  PREOPERATIVE DIAGNOSES: Intrauterine pregnancy at 2559w0d weeks gestation; Previous Cesarean Section  POSTOPERATIVE DIAGNOSES: The same  PROCEDURE: Repeat Low Transverse Cesarean Section  SURGEON:   Surgeon(s) and Role:    * Hermina StaggersErvin, Michael L, MD - Primary - Attending    * Nolene EbbsJulie Dewell Monnier, MD - OB Fellow  ASSISTANT:  Nolene EbbsJulie Millard Bautch, MD - OB Fellow   INDICATIONS: Anne Richardson is a [age] y.o. [phone] at 2459w0d here for cesarean section secondary to the indications listed under preoperative diagnoses; please see preoperative note for further details.  The risks of cesarean section were discussed with the patient including but were not limited to: bleeding which may require transfusion or reoperation; infection which may require antibiotics; injury to bowel, bladder, ureters or other surrounding organs; injury to the fetus; need for additional procedures including hysterectomy in the event of a life-threatening hemorrhage; placental abnormalities wth subsequent pregnancies, incisional problems, thromboembolic phenomenon and other postoperative/anesthesia complications.   The patient concurred with the proposed plan, giving informed written consent for the procedure.    FINDINGS:  Viable female infant in cephalic presentation.  Apgars 8 and 9.  Clear amniotic fluid.  Intact placenta, three vessel cord.  Normal uterus and fallopian tubes. Right ovary thecal luteal appearing; left ovary with multiple adhesions with peritoneal fat  ANESTHESIA: Spinal NTRAVENOUS FLUIDS: 2400 ml ESTIMATED BLOOD LOSS: 700 ml URINE OUTPUT:  200 ml SPECIMENS: Placenta sent to L&D COMPLICATIONS: None immediate  PROCEDURE IN DETAIL:  The patient preoperatively received intravenous antibiotics and had sequential compression devices applied to her lower extremities.  She was then taken to the operating room where spinal anesthesia was  administered and was found to be adequate. She was then placed in a dorsal supine position with a leftward tilt, and prepped and draped in a sterile manner.  A foley catheter was placed into her bladder and attached to constant gravity.    After an adequate timeout was performed, a Pfannenstiel skin incision was made with scalpel over her preexisting scar and carried through to the underlying layer of fascia. The fascia was incised in the midline, and this incision was extended bilaterally using the Mayo scissors.  Kocher clamps were applied to the superior aspect of the fascial incision and the underlying rectus muscles were dissected off bluntly.  A similar process was carried out on the inferior aspect of the fascial incision. The rectus muscles were separated in the midline bluntly and the peritoneum was entered bluntly. Attention was turned to the lower uterine segment where a low transverse hysterotomy was made with a scalpel and extended bilaterally bluntly.  The infant was successfully delivered, the cord was clamped and cut after one minute, and the infant was handed over to the awaiting neonatology team. Uterine massage was then administered, and the placenta delivered intact with a three-vessel cord. The uterus was then cleared of clots and debris.  The hysterotomy was closed with Chromic in a running locked fashion, and an imbricating layer was also placed with Chormic.  Figure-of-eight Chromic serosal stitches were placed to help with hemostasis.  The pelvis was cleared of all clot and debris. Hemostasis was confirmed on all surfaces.  The peritoneum was closed and muscle approximated with a 0 Vicryl running stitch. The fascia was then closed using 0 Vicryl.  The subcutaneous layer was irrigated, then reapproximated with 2-0 plain gut interrupted stitches.  The skin was closed with a 4-0 Vicryl  subcuticular stitch on a Keith needle, and Steri strips ware placed over incision.   The patient tolerated  the procedure well. Sponge, lap, instrument and needle counts were correct x 3.  She was taken to the recovery room in stable condition.    Disposition: PACU - hemodynamically stable.   Maternal Condition: stable    Signed: Frederik PearJulie P Daxten Kovalenko, MD OB Fellow 07/24/2017 12:15 PM

## 2017-07-24 NOTE — Anesthesia Procedure Notes (Signed)
Spinal  Patient location during procedure: OR Start time: 07/24/2017 10:40 AM End time: 07/24/2017 10:45 AM Staffing Anesthesiologist: Marcene DuosFITZGERALD, Deshauna Cayson Performed: anesthesiologist  Preanesthetic Checklist Completed: patient identified, site marked, surgical consent, pre-op evaluation, timeout performed, IV checked, risks and benefits discussed and monitors and equipment checked Spinal Block Patient position: sitting Prep: site prepped and draped and DuraPrep Patient monitoring: blood pressure, continuous pulse ox and heart rate Approach: midline Location: L4-5 Injection technique: single-shot Needle Needle type: Pencan  Needle gauge: 24 G Needle length: 9 cm Needle insertion depth: 9 cm Assessment Sensory level: T6

## 2017-07-24 NOTE — Anesthesia Preprocedure Evaluation (Signed)
Anesthesia Evaluation  Patient identified by MRN, date of birth, ID band Patient awake    Reviewed: Allergy & Precautions, NPO status , Patient's Chart, lab work & pertinent test results  Airway Mallampati: III  TM Distance: >3 FB     Dental   Pulmonary neg pulmonary ROS,    breath sounds clear to auscultation       Cardiovascular negative cardio ROS   Rhythm:Regular Rate:Normal     Neuro/Psych negative neurological ROS     GI/Hepatic negative GI ROS, Neg liver ROS,   Endo/Other  Morbid obesity  Renal/GU negative Renal ROS     Musculoskeletal   Abdominal   Peds  Hematology  (+) anemia ,   Anesthesia Other Findings   Reproductive/Obstetrics (+) Pregnancy                             Lab Results  Component Value Date   WBC 11.3 (H) 07/24/2017   HGB 10.9 (L) 07/24/2017   HCT 32.3 (L) 07/24/2017   MCV 87.3 07/24/2017   PLT 284 07/24/2017   Lab Results  Component Value Date   CREATININE 0.54 12/15/2016   BUN 8 12/15/2016   NA 134 (L) 12/15/2016   K 3.6 12/15/2016   CL 105 12/15/2016   CO2 24 12/15/2016    Anesthesia Physical Anesthesia Plan  ASA: III  Anesthesia Plan: Spinal   Post-op Pain Management:    Induction:   PONV Risk Score and Plan: 3 and Ondansetron, Dexamethasone, Scopolamine patch - Pre-op and Treatment may vary due to age or medical condition  Airway Management Planned: Natural Airway  Additional Equipment:   Intra-op Plan:   Post-operative Plan:   Informed Consent: I have reviewed the patients History and Physical, chart, labs and discussed the procedure including the risks, benefits and alternatives for the proposed anesthesia with the patient or authorized representative who has indicated his/her understanding and acceptance.     Plan Discussed with: CRNA  Anesthesia Plan Comments:         Anesthesia Quick Evaluation

## 2017-07-24 NOTE — Transfer of Care (Signed)
Immediate Anesthesia Transfer of Care Note  Patient: Anne BlazerChiquita J Guadarrama  Procedure(s) Performed: Procedure(s): CESAREAN SECTION (N/A)  Patient Location: PACU  Anesthesia Type:Spinal  Level of Consciousness: awake, alert  and oriented  Airway & Oxygen Therapy: Patient Spontanous Breathing  Post-op Assessment: Report given to RN and Post -op Vital signs reviewed and stable  Post vital signs: Reviewed and stable  Last Vitals:  Vitals:   07/24/17 0924  BP: 112/67  Pulse: 91  Resp: 18  Temp: 36.6 C    Last Pain:  Vitals:   07/24/17 0924  TempSrc: Oral         Complications: No apparent anesthesia complications

## 2017-07-24 NOTE — H&P (Signed)
Anne Richardson is a [age] y.[id number] IUP 39 weeks female presenting for RLTCS secondary to prior c/s x 3. Pt reports good fetal movement. Denies vaginal bleeding or LOF. Limited prenatal care d/t child care issues. POBH complicated by c section x 3, H/O GDM and PTD of twins at 1935 + weeks d/t to nonreassuring fetal testing.  OB History    Gravida Para Term Preterm AB Living   6 3 2 1 2 4    SAB TAB Ectopic Multiple Live Births   2 0   1 4     Past Medical History:  Diagnosis Date  . Pregnancy induced hypertension    Past Surgical History:  Procedure Laterality Date  . BIOPSY BREAST    . BREAST LUMPECTOMY     left  . CESAREAN SECTION    . CESAREAN SECTION  06/28/2012   Procedure: CESAREAN SECTION;  Surgeon: Lesly DukesKelly H Leggett, MD;  Location: [facility];  Service: Gynecology;  Laterality: N/A;  . CESAREAN SECTION N/A 03/21/2016   Procedure: CESAREAN SECTION;  Surgeon: Lazaro ArmsLuther H Eure, MD;  Location: [facility];  Service: Obstetrics;  Laterality: N/A;  . WISDOM TOOTH EXTRACTION     age 31   Family History: family history includes Alcohol abuse in her maternal aunt, maternal grandfather, maternal grandmother, maternal uncle, and mother; Alzheimer's disease in her paternal grandmother; Asthma in her brother; Cancer in her maternal grandfather; Cancer (age of onset: 9735) in her mother; Cancer (age of onset: 4965) in her maternal aunt; Depression in her brother, mother, paternal grandfather, and sister; Diabetes in her paternal grandmother; Heart disease in her maternal grandfather and maternal grandmother; Hypertension in her brother, maternal aunt, maternal grandfather, maternal grandmother, maternal uncle, mother, and sister. Social History:  reports that she has never smoked. She has never used smokeless tobacco. She reports that she uses drugs, including Marijuana. She reports that she does not drink alcohol.     Maternal Diabetes: No Genetic Screening: Declined Maternal Ultrasounds/Referrals:  Normal Fetal Ultrasounds or other Referrals:  None Maternal Substance Abuse:  No Significant Maternal Medications:  None Significant Maternal Lab Results:  None Other Comments:  Limited prenatal care  Review of Systems  Constitutional: Negative.   Eyes: Negative.   Respiratory: Negative.   Cardiovascular: Negative.   Gastrointestinal: Negative.   Genitourinary: Negative.    History   Blood pressure 112/67, pulse 91, temperature 97.8 F (36.6 C), temperature source Oral, resp. rate 18, height 5\' 5"  (1.651 m), weight 117 kg (258 lb), last menstrual period 09/23/2016, not currently breastfeeding. Exam Physical Exam  Constitutional: She appears well-developed.  Cardiovascular: Normal rate, regular rhythm and normal heart sounds.   Respiratory: Effort normal and breath sounds normal.  GI: Soft. Bowel sounds are normal.  Gravid, S=D  Genitourinary:  Genitourinary Comments: Deferred  Musculoskeletal: She exhibits edema.    Prenatal labs: ABO, Rh: B/Positive/-- (07/31 1442) Antibody: Negative (07/31 1442) Rubella: 2.71 (07/31 1442) RPR: Non Reactive (07/31 1442)  HBsAg: Negative (07/31 1442)  HIV:    GBS:   positive  Assessment/Plan: IUP 39 0/7 weeks Prior c section x 3 for RLTCS  R/B/Post op care reviewed with pt. Pt has verbalized understanding and agrees to proceed.   Anne StaggersMichael L Anes Richardson 07/24/2017, 9:47 AM

## 2017-07-24 NOTE — Addendum Note (Signed)
Addendum  created 07/24/17 1643 by Renford DillsMullins, Steffie Waggoner L, CRNA   Sign clinical note

## 2017-07-24 NOTE — Anesthesia Postprocedure Evaluation (Signed)
Anesthesia Post Note  Patient: Anne Richardson  Procedure(s) Performed: Procedure(s) (LRB): CESAREAN SECTION (N/A)     Patient location during evaluation: Mother Baby Anesthesia Type: Spinal Level of consciousness: awake Pain management: pain level controlled Vital Signs Assessment: post-procedure vital signs reviewed and stable Respiratory status: spontaneous breathing Cardiovascular status: stable Postop Assessment: no headache, no backache, spinal receding, patient able to bend at knees, no signs of nausea or vomiting and adequate PO intake Anesthetic complications: no    Last Vitals:  Vitals:   07/24/17 1531 07/24/17 1535  BP: 126/76 130/80  Pulse: (!) 57 (!) 56  Resp: 17 17  Temp: 36.5 C 36.7 C    Last Pain:  Vitals:   07/24/17 1535  TempSrc: Oral  PainSc: 3    Pain Goal: Patients Stated Pain Goal: 3 (07/24/17 1315)               Aedon Deason

## 2017-07-25 LAB — RPR: RPR Ser Ql: NONREACTIVE

## 2017-07-25 LAB — CBC
HCT: 29.3 % — ABNORMAL LOW (ref 36.0–46.0)
Hemoglobin: 9.8 g/dL — ABNORMAL LOW (ref 12.0–15.0)
MCH: 29.3 pg (ref 26.0–34.0)
MCHC: 33.4 g/dL (ref 30.0–36.0)
MCV: 87.5 fL (ref 78.0–100.0)
PLATELETS: 271 10*3/uL (ref 150–400)
RBC: 3.35 MIL/uL — AB (ref 3.87–5.11)
RDW: 15.9 % — AB (ref 11.5–15.5)
WBC: 19 10*3/uL — AB (ref 4.0–10.5)
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
## 2017-07-25 NOTE — Progress Notes (Signed)
Post Partum Day 1  Subjective:  Anne Richardson is a 31 y.o. [id number] 2175w0d s/p rLTCS.  No acute events overnight.  Pt denies problems with ambulating, voiding or po intake.  She denies nausea or vomiting.  Pain is well controlled.  She has had flatus. She has not had bowel movement.  Lochia Small.  Plan for birth control is no method.  Method of Feeding: bottle  Objective: BP (!) 109/42   Pulse 60   Temp 98.2 F (36.8 C) (Oral)   Resp 18   Ht 5\' 5"  (1.651 m)   Wt 258 lb (117 kg)   LMP 09/23/2016   SpO2 97%   Breastfeeding? Unknown   BMI 42.93 kg/m   Physical Exam:  General: alert, cooperative and no distress Lochia:normal flow Chest: CTAB Heart: RRR no m/r/g Abdomen: +BS, soft, nontender, fundus firm at/below umbilicus, incision bandaged, clean and intact Uterine Fundus: firm DVT Evaluation: No evidence of DVT seen on physical exam. Extremities: trace edema   Recent Labs  07/24/17 0931 07/25/17 0531  HGB 10.9* 9.8*  HCT 32.3* 29.3*    Assessment/Plan:  ASSESSMENT: Anne Richardson is a 31 y.o. [id number] 3175w0d ppd #1 s/p rLTCS doing well.   Continue routine post partum care.    LOS: 1 day   John GiovanniCorey P Cox 07/25/2017, 7:16 AM
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
## 2017-07-26 ENCOUNTER — Encounter (HOSPITAL_COMMUNITY): Payer: Self-pay | Admitting: *Deleted

## 2017-07-26 NOTE — Progress Notes (Signed)
CPS case assigned to Lisa Joyce [phone].  CSW spoke with Ms. Joyce who states she may attempt to meet with [name] prior to discharge from the hospital, but states she will meet her at home if discharge happens before she is able to get here.  She reports no barriers to discharge and that she will be following.  CSW informed CN staff.   

## 2017-07-26 NOTE — Progress Notes (Addendum)
Post Partum Day 2  Subjective:  Anne Richardson is a 31 y.o. [id number] 4010w0d s/p rLTCS.  No acute events overnight.  Pt denies problems with ambulating, voiding or po intake.  She denies nausea or vomiting.  Pain is well controlled.  She has had flatus. She has not had bowel movement.  Lochia Small.  Plan for birth control is no method.  Method of Feeding: bottle  Objective: BP 125/77 (BP Location: Right Arm)   Pulse 64   Temp 98.3 F (36.8 C) (Oral)   Resp 18   Ht 5\' 5"  (1.651 m)   Wt 258 lb (117 kg)   LMP 09/23/2016   SpO2 98%   Breastfeeding? Unknown   BMI 42.93 kg/m   Physical Exam:  General: alert, cooperative and no distress Lochia:normal flow Chest: CTAB Heart: RRR no m/r/g Abdomen: +BS, soft, nontender, fundus firm at/below umbilicus, incision bandaged, clean and intact Uterine Fundus: firm DVT Evaluation: No evidence of DVT seen on physical exam. Extremities: trace edema   Recent Labs  07/24/17 0931 07/25/17 0531  HGB 10.9* 9.8*  HCT 32.3* 29.3*    Assessment/Plan:  ASSESSMENT: Anne Richardson is a 31 y.o. [id number] 5210w0d ppd #2 s/p rLTCS doing well. Encouraging ambulation today. Pain well controlled. Meeting goals of care.  Continue routine post partum care.    LOS: 2 days   John GiovanniCorey P Cox 07/26/2017, 7:51 AM   CNM attestation Post Partum Day #2 I have seen and examined this patient and agree with above documentation in the resident's note.   Anne Richardson is a 31 y.o. [id number] s/p rLTCS.  Pt denies problems with ambulating, voiding or po intake. Pain is well controlled.  Plan for birth control is Nexplanon.  Method of Feeding: bottle  PE:  BP 133/74 (BP Location: Left Arm)   Pulse 70   Temp 98.3 F (36.8 C) (Oral)   Resp 18   Ht 5\' 5"  (1.651 m)   Wt 117 kg (258 lb)   LMP 09/23/2016   SpO2 98%   Breastfeeding? Unknown   BMI 42.93 kg/m  Fundus firm  Plan for discharge: 07/27/17  Cam HaiSHAW, Kase Shughart, CNM 9:22 AM

## 2017-07-26 NOTE — Care Management Note (Signed)
Case Management Note  Patient Details  Name: Anne Richardson MRN: [id number] Date of Birth: 10/24/1986  Subjective/Objective:   31 year old female admitted 07/24/17 S/P repeat low transverse C-section.                Action/Plan:D/C when medically stable.                Expected Discharge Plan:  Home/Self Care  In-House Referral:  Clinical Social Work Status of Service:  Completed, signed off:    Kathi Dererri Stanisha Lorenz RNC-MNN, BSN 07/26/2017, 3:46 PM

## 2017-07-27 MED ORDER — IBUPROFEN 600 MG PO TABS: 600.0000 mg | ORAL_TABLET | Freq: Four times a day (QID) | ORAL | 0 refills | Status: DC

## 2017-07-27 MED ORDER — SENNOSIDES-DOCUSATE SODIUM 8.6-50 MG PO TABS: 1.0000 | ORAL_TABLET | Freq: Every evening | ORAL | 0 refills | Status: DC | PRN

## 2017-07-27 MED ORDER — OXYCODONE HCL 5 MG PO TABS: 5.0000 mg | ORAL_TABLET | ORAL | 0 refills | Status: DC | PRN

## 2017-07-27 NOTE — Discharge Instructions (Signed)

## 2017-07-27 NOTE — Discharge Summary (Signed)
OB Discharge Summary     Patient Name: Anne Richardson DOB: July 02, 1986 MRN: [id number]  Date of admission: 07/24/2017 Delivering MD: [name]   Date of discharge: 07/27/2017  Admitting diagnosis: RCS Intrauterine pregnancy: [redacted]w[redacted]d     Secondary diagnosis:  Active Problems:   Status post repeat low transverse cesarean section  Additional problems:  Patient Active Problem List   Diagnosis Date Noted  . Status post repeat low transverse cesarean section 07/24/2017  . Insufficient prenatal care in third trimester 07/20/2017  . Supervision of other normal pregnancy, antepartum 07/20/2017  . Previous cesarean section 03/21/2016      Discharge diagnosis: Term Pregnancy Delivered                                                                                                Post partum procedures:none  Augmentation: none  Complications: None  Hospital course:  Sceduled C/S   31 y.o. yo [id number] at [redacted]w[redacted]d was admitted to the hospital 07/24/2017 for scheduled cesarean section with the following indication:Elective Repeat.  Membrane Rupture Time/Date: 11:12 AM ,07/24/2017   Patient delivered a Viable infant.07/24/2017  Details of operation can be found in separate operative note.  [name] had an uncomplicated postpartum course.  She is ambulating, tolerating a regular diet, passing flatus, and urinating well. Patient is discharged home in stable condition on  07/27/17         Physical exam  Vitals:   07/25/17 1850 07/26/17 0519 07/26/17 1816 07/27/17 0617  BP: 125/72 125/77 127/73 133/74  Pulse: 76 64 71 70  Resp: 18 18 18 18   Temp: 98.3 F (36.8 C) 98.3 F (36.8 C) 98.2 F (36.8 C) 98.3 F (36.8 C)  TempSrc: Oral Oral Oral Oral  SpO2:      Weight:      Height:       General: alert, cooperative and no distress Lochia: appropriate Uterine Fundus: firm Incision: Healing well with no significant drainage, No significant erythema, Dressing is clean, dry, and intact DVT  Evaluation: No evidence of DVT seen on physical exam. Negative Homan's sign. No significant calf/ankle edema. Labs: Lab Results  Component Value Date   WBC 19.0 (H) 07/25/2017   HGB 9.8 (L) 07/25/2017   HCT 29.3 (L) 07/25/2017   MCV 87.5 07/25/2017   PLT 271 07/25/2017   CMP Latest Ref Rng & Units 12/15/2016  Glucose 65 - 99 mg/dL 88  BUN 6 - 20 mg/dL 8  Creatinine 1.47 - 8.29 mg/dL 5.62  Sodium 130 - 865 mmol/L 134(L)  Potassium 3.5 - 5.1 mmol/L 3.6  Chloride 101 - 111 mmol/L 105  CO2 22 - 32 mmol/L 24  Calcium 8.9 - 10.3 mg/dL 8.9  Total Protein 6.5 - 8.1 g/dL 6.8  Total Bilirubin 0.3 - 1.2 mg/dL 7.8(I)  Alkaline Phos 38 - 126 U/L 49  AST 15 - 41 U/L 14(L)  ALT 14 - 54 U/L 7(L)    Discharge instruction: per After Visit Summary and "Baby and Me Booklet".  After visit meds:  Allergies as of 07/27/2017      Reactions  Penicillins Itching, Swelling   Has patient had a PCN reaction causing immediate rash, facial/tongue/throat swelling, SOB or lightheadedness with hypotension: No Has patient had a PCN reaction causing severe rash involving mucus membranes or skin necrosis: No Has patient had a PCN reaction that required hospitalization No Has patient had a PCN reaction occurring within the last 10 years: No If all of the above answers are "NO", then may proceed with Cephalosporin use.      Medication List    TAKE these medications   cyclobenzaprine 10 MG tablet Commonly known as:  FLEXERIL Take 1 tablet (10 mg total) by mouth 3 (three) times daily as needed for muscle spasms.   ibuprofen 600 MG tablet Commonly known as:  ADVIL,MOTRIN Take 1 tablet (600 mg total) by mouth every 6 (six) hours.   oxyCODONE 5 MG immediate release tablet Commonly known as:  Oxy IR/ROXICODONE Take 1 tablet (5 mg total) by mouth every 4 (four) hours as needed (pain scale 4-7).   PRENATAL VITAMINS PLUS 27-1 MG Tabs Take 1 tablet by mouth daily. Generic is OK - Prenatal vitamin with 1 mg  Folic acid   senna-docusate 8.6-50 MG tablet Commonly known as:  Senokot-S Take 1 tablet by mouth at bedtime as needed for mild constipation.       Diet: routine diet  Activity: Advance as tolerated. Pelvic rest for 6 weeks.   Outpatient follow up:4-6 weeks Follow up Appt:No future appointments. Follow up Visit:No Follow-up on file.  Postpartum contraception: Nexplanon  Newborn Data: Live born female  Birth Weight: 7 lb 2.8 oz (3255 g) APGAR: 8, 9  Baby Feeding: Bottle Disposition:home with mother   07/27/2017 Anne Lanorris Kalisz, DO

## 2017-07-30 ENCOUNTER — Encounter: Payer: Self-pay | Admitting: General Practice

## 2017-08-05 ENCOUNTER — Encounter: Payer: Self-pay | Admitting: Obstetrics & Gynecology

## 2017-08-05 ENCOUNTER — Ambulatory Visit (INDEPENDENT_AMBULATORY_CARE_PROVIDER_SITE_OTHER): Payer: Self-pay | Admitting: Obstetrics & Gynecology

## 2017-08-05 VITALS — Temp 98.8°F

## 2017-08-05 DIAGNOSIS — O8600 Infection of obstetric surgical wound, unspecified: Secondary | ICD-10-CM

## 2017-08-05 MED ORDER — DICLOXACILLIN SODIUM 500 MG PO CAPS: 500.0000 mg | ORAL_CAPSULE | Freq: Three times a day (TID) | ORAL | 0 refills | Status: DC

## 2017-08-05 NOTE — Progress Notes (Signed)
Patient in for incision check. She reports that a nurse looked at incision yesterday and told her to return to our office to have it checked. Incision is open with yellowish drainage. I discussed patient with Dr. Erin FullingHarraway Smith and she agrees to see patient.

## 2017-08-05 NOTE — Progress Notes (Signed)
History:  31 y.o. [id number] here today for a incision check. She is s/p c/s on 07/24/2017. She was seen by the home care nurse who called to say that the pts incision was open.  Pt denies fever or chills.  The pt reports and odor and yellow purulent drainage.  The following portions of the patient's history were reviewed and updated as appropriate: allergies, current medications, past family history, past medical history, past social history, past surgical history and problem list.  Review of Systems:  Pertinent items are noted in HPI.   Objective:  Physical Exam Last menstrual period 09/23/2016, unknown if currently breastfeeding.  CONSTITUTIONAL: Well-developed, well-nourished female in no acute distress.  HENT:  Normocephalic, atraumatic EYES: Conjunctivae and EOM are normal. No scleral icterus.  NECK: Normal range of motion SKIN: Skin is warm and dry. No rash noted. Not diaphoretic.No pallor. NEUROLGIC: Alert and oriented to person, place, and time. Normal coordination.  Abd: Soft, nontender and nondistended. Pt has a large panus and under her panus the incision is open in the midline. The opening is superficial and spans about 5-6cm in width.  There is no current drainage or induration or erythema. No odor appreciated. Pelvic: Normal appearing external genitalia; normal appearing vaginal mucosa and cervix.  Normal discharge.  Small uterus, no other palpable masses, no uterine or adnexal tenderness  Labs and Imaging No results found.  Assessment & Plan:  Superficial wound separation and infection  Reviewed wound care Dicloxacillin 500mg  tid x 7 days F/u in 1 week or sooner prn  Dallys Nowakowski L. Harraway-Smith, M.D., Evern CoreFACOG

## 2017-08-12 ENCOUNTER — Ambulatory Visit (INDEPENDENT_AMBULATORY_CARE_PROVIDER_SITE_OTHER): Payer: Self-pay | Admitting: Obstetrics & Gynecology

## 2017-08-12 VITALS — BP 138/87 | HR 67 | Temp 98.2°F

## 2017-08-12 DIAGNOSIS — T8130XD Disruption of wound, unspecified, subsequent encounter: Secondary | ICD-10-CM

## 2017-08-12 NOTE — Progress Notes (Signed)
Presented to the office for incision check. States that she is experiencing pain at her incision site.

## 2017-08-13 ENCOUNTER — Encounter: Payer: Self-pay | Admitting: Obstetrics & Gynecology

## 2017-08-13 NOTE — Progress Notes (Signed)
   Subjective:    Patient ID: Anne Richardson, female    DOB: 04-01-1986, 31 y.o.   MRN: [id number]  HPI  Pt s/p c section on 07/24/17.  Pt had uncomplicated hospital course.  Pt seen on 08/05/17 with wound separation.  No evidence of infection.  Pt started on Diclox.  Pt reports more drainage today.  Pt reports taking antibiotics as prescribed. No fever, rigors, chills.  Review of Systems  Constitutional: Negative.   Cardiovascular: Negative.   Gastrointestinal: Positive for abdominal pain.  Genitourinary: Negative.    Vitals:   08/12/17 1124  BP: 138/87  Pulse: 67  Temp: 98.2 F (36.8 C)       Objective:   Physical Exam  Constitutional: She is oriented to person, place, and time. She appears well-developed and well-nourished. No distress.  HENT:  Head: Normocephalic and atraumatic.  Eyes: Conjunctivae are normal.  Pulmonary/Chest: Effort normal.  Abdominal: Soft. She exhibits no distension. There is no tenderness. There is no rebound.    Musculoskeletal: She exhibits no edema.  Neurological: She is alert and oriented to person, place, and time.  Skin: Skin is warm and dry.  Psychiatric: She has a normal mood and affect.  Vitals reviewed.           Wound disruption Area debrided with 50/50 solution of H2O2 and saline.  4x4 placed in wound.   Home health to visit over weekend Change antibiotics to Bactrim RT 1 week.

## 2017-08-13 NOTE — Progress Notes (Signed)
Per Dr. Elsie Lincoln, patient needs home care to come out to show her how to pack her wound. Order was put in for home care on 08/12/17. On 08/13/17, Mitchell Heir came into the office to verify patient's insurance. Patient's insurance is not active and she does not qualify for home care.

## 2017-08-16 ENCOUNTER — Telehealth: Payer: Self-pay | Admitting: General Practice

## 2017-08-16 NOTE — Telephone Encounter (Signed)
Called and notified patient of appointment on 08/19/17 at 3:40pm with Dr. Debroah Loop.  Patient voiced understanding.

## 2017-08-19 ENCOUNTER — Ambulatory Visit: Payer: Self-pay | Admitting: Obstetrics & Gynecology

## 2017-08-24 ENCOUNTER — Ambulatory Visit: Payer: Self-pay | Admitting: Advanced Practice Midwife

## 2018-05-18 ENCOUNTER — Encounter (HOSPITAL_COMMUNITY): Payer: Self-pay | Admitting: Family Medicine

## 2018-05-18 DIAGNOSIS — F329 Major depressive disorder, single episode, unspecified: Secondary | ICD-10-CM | POA: Insufficient documentation

## 2018-05-18 DIAGNOSIS — R45851 Suicidal ideations: Secondary | ICD-10-CM | POA: Insufficient documentation

## 2018-05-18 DIAGNOSIS — R531 Weakness: Secondary | ICD-10-CM | POA: Insufficient documentation

## 2018-05-18 DIAGNOSIS — F419 Anxiety disorder, unspecified: Secondary | ICD-10-CM | POA: Insufficient documentation

## 2018-05-18 DIAGNOSIS — Z202 Contact with and (suspected) exposure to infections with a predominantly sexual mode of transmission: Secondary | ICD-10-CM | POA: Insufficient documentation

## 2018-05-18 DIAGNOSIS — R42 Dizziness and giddiness: Secondary | ICD-10-CM | POA: Insufficient documentation

## 2018-05-18 DIAGNOSIS — Z046 Encounter for general psychiatric examination, requested by authority: Secondary | ICD-10-CM | POA: Insufficient documentation
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
## 2018-05-19 ENCOUNTER — Emergency Department (HOSPITAL_COMMUNITY)
Admission: EM | Admit: 2018-05-19 | Discharge: 2018-05-19 | Disposition: A | Discharge status: Other Acute Inpatient Hospital | Payer: Medicaid Other | Attending: Emergency Medicine | Admitting: Emergency Medicine

## 2018-05-19 ENCOUNTER — Encounter (HOSPITAL_COMMUNITY): Payer: Self-pay | Admitting: *Deleted

## 2018-05-19 ENCOUNTER — Other Ambulatory Visit: Payer: Self-pay

## 2018-05-19 ENCOUNTER — Inpatient Hospital Stay (HOSPITAL_COMMUNITY)
Admission: AD | Admit: 2018-05-19 | Discharge: 2018-05-24 | DRG: 885 | Disposition: A | Discharge status: Home / Self Care | Payer: Medicaid Other | Source: Intra-hospital | Attending: Psychiatry | Admitting: Psychiatry

## 2018-05-19 DIAGNOSIS — F1210 Cannabis abuse, uncomplicated: Secondary | ICD-10-CM

## 2018-05-19 DIAGNOSIS — Z811 Family history of alcohol abuse and dependence: Secondary | ICD-10-CM | POA: Diagnosis not present

## 2018-05-19 DIAGNOSIS — R443 Hallucinations, unspecified: Secondary | ICD-10-CM | POA: Diagnosis not present

## 2018-05-19 DIAGNOSIS — R45851 Suicidal ideations: Secondary | ICD-10-CM

## 2018-05-19 DIAGNOSIS — F4310 Post-traumatic stress disorder, unspecified: Secondary | ICD-10-CM | POA: Diagnosis present

## 2018-05-19 DIAGNOSIS — G4700 Insomnia, unspecified: Secondary | ICD-10-CM | POA: Diagnosis present

## 2018-05-19 DIAGNOSIS — Z590 Homelessness: Secondary | ICD-10-CM | POA: Diagnosis not present

## 2018-05-19 DIAGNOSIS — F250 Schizoaffective disorder, bipolar type: Secondary | ICD-10-CM | POA: Diagnosis present

## 2018-05-19 DIAGNOSIS — F410 Panic disorder [episodic paroxysmal anxiety] without agoraphobia: Secondary | ICD-10-CM | POA: Diagnosis present

## 2018-05-19 DIAGNOSIS — E7800 Pure hypercholesterolemia, unspecified: Secondary | ICD-10-CM | POA: Diagnosis present

## 2018-05-19 DIAGNOSIS — Z818 Family history of other mental and behavioral disorders: Secondary | ICD-10-CM

## 2018-05-19 DIAGNOSIS — Z62810 Personal history of physical and sexual abuse in childhood: Secondary | ICD-10-CM | POA: Diagnosis present

## 2018-05-19 DIAGNOSIS — F419 Anxiety disorder, unspecified: Secondary | ICD-10-CM

## 2018-05-19 DIAGNOSIS — Z79899 Other long term (current) drug therapy: Secondary | ICD-10-CM | POA: Diagnosis not present

## 2018-05-19 DIAGNOSIS — Z810 Family history of intellectual disabilities: Secondary | ICD-10-CM | POA: Diagnosis not present

## 2018-05-19 LAB — COMPREHENSIVE METABOLIC PANEL
ALBUMIN: 3.8 g/dL (ref 3.5–5.0)
ALK PHOS: 67 U/L (ref 38–126)
ALT: 12 U/L — ABNORMAL LOW (ref 14–54)
ANION GAP: 10 (ref 5–15)
AST: 16 U/L (ref 15–41)
BILIRUBIN TOTAL: 0.4 mg/dL (ref 0.3–1.2)
BUN: 11 mg/dL (ref 6–20)
CALCIUM: 9.4 mg/dL (ref 8.9–10.3)
CO2: 23 mmol/L (ref 22–32)
Chloride: 105 mmol/L (ref 101–111)
Creatinine, Ser: 0.76 mg/dL (ref 0.44–1.00)
GFR calc Af Amer: >60 mL/min (ref >60)
GFR calc non Af Amer: >60 mL/min (ref >60)
GLUCOSE: 114 mg/dL — AB (ref 65–99)
Potassium: 3.3 mmol/L — ABNORMAL LOW (ref 3.5–5.1)
SODIUM: 138 mmol/L (ref 135–145)
TOTAL PROTEIN: 7.7 g/dL (ref 6.5–8.1)

## 2018-05-19 LAB — CBC
HEMATOCRIT: 39 % (ref 36.0–46.0)
Hemoglobin: 12.9 g/dL (ref 12.0–15.0)
MCH: 29.3 pg (ref 26.0–34.0)
MCHC: 33.1 g/dL (ref 30.0–36.0)
MCV: 88.6 fL (ref 78.0–100.0)
Platelets: 321 10*3/uL (ref 150–400)
RBC: 4.4 MIL/uL (ref 3.87–5.11)
RDW: 14 % (ref 11.5–15.5)
WBC: 8 10*3/uL (ref 4.0–10.5)

## 2018-05-19 LAB — GC/CHLAMYDIA PROBE AMP (~~LOC~~) NOT AT ARMC
CHLAMYDIA, DNA PROBE: NEGATIVE
Neisseria Gonorrhea: NEGATIVE

## 2018-05-19 LAB — ETHANOL: Alcohol, Ethyl (B): <10 mg/dL (ref <10)

## 2018-05-19 LAB — RAPID URINE DRUG SCREEN, HOSP PERFORMED
AMPHETAMINES: NOT DETECTED
BARBITURATES: NOT DETECTED
Benzodiazepines: NOT DETECTED
Cocaine: NOT DETECTED
Opiates: NOT DETECTED
TETRAHYDROCANNABINOL: POSITIVE — AB

## 2018-05-19 LAB — I-STAT BETA HCG BLOOD, ED (MC, WL, AP ONLY)

## 2018-05-19 LAB — RPR: RPR: NONREACTIVE

## 2018-05-19 LAB — HIV ANTIBODY (ROUTINE TESTING W REFLEX): HIV SCREEN 4TH GENERATION: NONREACTIVE

## 2018-05-19 MED ORDER — ONDANSETRON HCL 4 MG PO TABS: 4.0000 mg | ORAL_TABLET | Freq: Three times a day (TID) | ORAL | Status: DC | PRN

## 2018-05-19 MED ORDER — IBUPROFEN 800 MG PO TABS
800.0000 mg | ORAL_TABLET | Freq: Once | ORAL | Status: AC
  Administered 2018-05-19: 800 mg via ORAL
  Filled 2018-05-19: qty 1

## 2018-05-19 MED ORDER — ACETAMINOPHEN 325 MG PO TABS
650.0000 mg | ORAL_TABLET | Freq: Four times a day (QID) | ORAL | Status: DC | PRN
Start: 2018-05-19 — End: 2018-05-24
  Administered 2018-05-20 – 2018-05-23 (×4): 650 mg via ORAL
  Filled 2018-05-19 (×4): qty 2
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
## 2018-05-19 NOTE — ED Notes (Signed)
Asked pt if she could provide a urine sample. Pt stated that she did not have to go at this time.

## 2018-05-19 NOTE — ED Notes (Signed)
Bed: [facility]36 Expected date:  Expected time:  Means of arrival:  Comments: Hall C 

## 2018-05-19 NOTE — Progress Notes (Signed)
Anne Richardson is a 32 year old female pt admitted on voluntary basis. On admission, she reports that she has been feeling depressed and suicidal and does endorse A/V hallucinations and reports that she has been suffering with those for most of her life. She reports that she has never told anyone before about what she has been dealing with and reports that she has never been on medications before. She reports that she would like help with her depression and hallucinations while she is here. She does endorse occasional marijuana usage but denies any other substance abuse issues. She reports that she is homeless and that she has no financial or emotional support systems and also reports child care issues and reports that she has 5 children. She does endorse passive SI on admission but is able to contract for safety while in the hospital. Anne Richardson was oriented to the unit and safety maintained.

## 2018-05-19 NOTE — ED Notes (Signed)
Pt oriented to room and unit.  Pt is calm and cooperative.  Breakfast was given to pt.

## 2018-05-19 NOTE — ED Notes (Addendum)
Pt. In burgundy scrubs and wanded by security.pt. Has 1 belongings bag locked up in cabinet 23-25. Pt. Has 2 cell phones, 1 ID card, 1 pr. Shoes, 1 shirt, 1 bra, and 1 black pant and 1 yellow ring put in specimen cup.

## 2018-05-19 NOTE — BH Assessment (Signed)
[facility] Assessment Progress Note  Per Juanetta Beets, DO, this pt requires psychiatric hospitalization at this time.  Berneice Heinrich, RN, [facility] has assigned pt to [facility] Rm 306-1; [facility] will be ready to receive pt at 21:00.  Pt has signed Voluntary Admission and Consent for Treatment, as well as Consent to Release Information to pt's aunt, and pt's case worker at [employer].  Signed forms have been faxed to [facility].  Pt's nurse, Kendal Hymen, has been notified, and agrees to send original paperwork along with pt via Juel Burrow, and to call report to [phone].  Doylene Canning, [place] [facility] Coordinator [phone]

## 2018-05-19 NOTE — Tx Team (Signed)
Initial Treatment Plan 05/19/2018 10:08 PM Anne Richardson ZOX:[id number]    PATIENT STRESSORS: Financial difficulties Marital or family conflict Other: child care issues   PATIENT STRENGTHS: Ability for insight Average or above average intelligence Capable of independent living General fund of knowledge   PATIENT IDENTIFIED PROBLEMS: Depression Suicidal thoughts A/V hallucinations "Help with depression" "Start some medicine if it will help"                     DISCHARGE CRITERIA:  Ability to meet basic life and health needs Improved stabilization in mood, thinking, and/or behavior Verbal commitment to aftercare and medication compliance  PRELIMINARY DISCHARGE PLAN: Attend aftercare/continuing care group  PATIENT/FAMILY INVOLVEMENT: This treatment plan has been presented to and reviewed with the patient, Anne Richardson, and/or family member, .  The patient and family have been given the opportunity to ask questions and make suggestions.  Taz Vanness, [place], [place] 05/19/2018, 10:08 PM

## 2018-05-19 NOTE — ED Provider Notes (Signed)
[facility]-EMERGENCY DEPT Provider Note   CSN: [id number] Arrival date & time: 05/18/18  2057     History   Chief Complaint Chief Complaint  Patient presents with  . Anxiety    HPI Anne Richardson is a 32 y.o. female.  Patient reports that she had onset of generalized weakness, dizziness and severe anxiety earlier today after smoking marijuana.  She feels somewhat better now but feels like if she were to get up and walk she would still feel very weak.  Patient reports that she is very stressed over social concerns.  Her children's father recently told her that he has AIDS, she would like to be tested for STDs.  She is feeling depressed and at times feels suicidal.     Past Medical History:  Diagnosis Date  . Pregnancy induced hypertension     Patient Active Problem List   Diagnosis Date Noted  . Status post repeat low transverse cesarean section 07/24/2017  . Insufficient prenatal care in third trimester 07/20/2017  . Supervision of other normal pregnancy, antepartum 07/20/2017  . Previous cesarean section 03/21/2016    Past Surgical History:  Procedure Laterality Date  . BIOPSY BREAST    . BREAST LUMPECTOMY     left  . CESAREAN SECTION    . CESAREAN SECTION  06/28/2012   Procedure: CESAREAN SECTION;  Surgeon: Lesly Dukes, MD;  Location: [facility];  Service: Gynecology;  Laterality: N/A;  . CESAREAN SECTION N/A 03/21/2016   Procedure: CESAREAN SECTION;  Surgeon: Lazaro Arms, MD;  Location: [facility];  Service: Obstetrics;  Laterality: N/A;  . CESAREAN SECTION N/A 07/24/2017   Procedure: CESAREAN SECTION;  Surgeon: [name], MD;  [facility];  Service: Obstetrics;  Laterality: N/A;  . WISDOM TOOTH EXTRACTION     age 74     OB History    Gravida  6   Para  4   Term  3   Preterm  1   AB  2   Living  5     SAB  2   TAB  0   Ectopic      Multiple  1   Live Births  5            Home Medications      Prior to Admission medications   Medication Sig Start Date End Date Taking? Authorizing Provider  cyclobenzaprine (FLEXERIL) 10 MG tablet Take 1 tablet (10 mg total) by mouth 3 (three) times daily as needed for muscle spasms. Patient not taking: Reported on 05/19/2018 07/18/17   Aviva Signs, CNM  dicloxacillin (DYNAPEN) 500 MG capsule Take 1 capsule (500 mg total) by mouth 3 (three) times daily. Patient not taking: Reported on 05/19/2018 08/05/17   Willodean Rosenthal, MD  ibuprofen (ADVIL,MOTRIN) 600 MG tablet Take 1 tablet (600 mg total) by mouth every 6 (six) hours. Patient not taking: Reported on 05/19/2018 07/27/17   Shirley, [place], DO  oxyCODONE (OXY IR/ROXICODONE) 5 MG immediate release tablet Take 1 tablet (5 mg total) by mouth every 4 (four) hours as needed (pain scale 4-7). Patient not taking: Reported on 05/19/2018 07/27/17   Shirley, [place], DO  senna-docusate (SENOKOT-S) 8.6-50 MG tablet Take 1 tablet by mouth at bedtime as needed for mild constipation. Patient not taking: Reported on 05/19/2018 07/27/17   Shirley, [place], DO    Family History Family History  Problem Relation Age of Onset  . Cancer Mother 28  breast  . Alcohol abuse Mother   . Depression Mother   . Hypertension Mother   . Depression Sister   . Hypertension Sister   . Asthma Brother   . Depression Brother   . Hypertension Brother   . Alcohol abuse Maternal Aunt   . Hypertension Maternal Aunt   . Cancer Maternal Aunt 65       breast ca  . Alcohol abuse Maternal Uncle   . Hypertension Maternal Uncle   . Heart disease Maternal Grandmother   . Alcohol abuse Maternal Grandmother   . Hypertension Maternal Grandmother   . Heart disease Maternal Grandfather   . Alcohol abuse Maternal Grandfather   . Hypertension Maternal Grandfather   . Cancer Maternal Grandfather        prostate and bone  . Depression Paternal Grandfather   . Alzheimer's disease Paternal Grandmother   . Diabetes Paternal  Grandmother     Social History Social History   Tobacco Use  . Smoking status: Never Smoker  . Smokeless tobacco: Never Used  Substance Use Topics  . Alcohol use: No  . Drug use: Yes    Types: Marijuana    Comment: Last used at 17:00 today      Allergies   Penicillins   Review of Systems Review of Systems  Psychiatric/Behavioral: Positive for dysphoric mood and suicidal ideas. The patient is nervous/anxious.   All other systems reviewed and are negative.    Physical Exam Updated Vital Signs BP 106/68 (BP Location: Right Arm)   Pulse 73   Temp 98 F (36.7 C) (Oral)   Resp 14   Ht  (1.676 m)   Wt 111.1 kg (245 lb)   SpO2 100%   BMI 39.54 kg/m   Physical Exam  Constitutional: She is oriented to person, place, and time. She appears well-developed and well-nourished. No distress.  HENT:  Head: Normocephalic and atraumatic.  Right Ear: Hearing normal.  Left Ear: Hearing normal.  Nose: Nose normal.  Mouth/Throat: Oropharynx is clear and moist and mucous membranes are normal.  Eyes: Pupils are equal, round, and reactive to light. Conjunctivae and EOM are normal.  Neck: Normal range of motion. Neck supple.  Cardiovascular: Regular rhythm, S1 normal and S2 normal. Exam reveals no gallop and no friction rub.  No murmur heard. Pulmonary/Chest: Effort normal and breath sounds normal. No respiratory distress. She exhibits no tenderness.  Abdominal: Soft. Normal appearance and bowel sounds are normal. There is no hepatosplenomegaly. There is no tenderness. There is no rebound, no guarding, no tenderness at McBurney's point and negative Murphy's sign. No hernia.  Musculoskeletal: Normal range of motion.  Neurological: She is alert and oriented to person, place, and time. She has normal strength. No cranial nerve deficit or sensory deficit. Coordination normal. GCS eye subscore is 4. GCS verbal subscore is 5. GCS motor subscore is 6.  Skin: Skin is warm, dry and intact.  No rash noted. No cyanosis.  Psychiatric: Her speech is delayed. She is slowed and withdrawn. She exhibits a depressed mood. She expresses suicidal ideation.  Nursing note and vitals reviewed.    ED Treatments / Results  Labs (all labs ordered are listed, but only abnormal results are displayed) Labs Reviewed  CBC  COMPREHENSIVE METABOLIC PANEL  ETHANOL  RAPID URINE DRUG SCREEN, HOSP PERFORMED  HIV ANTIBODY (ROUTINE TESTING)  RPR  I-STAT BETA HCG BLOOD, ED ([facility], [facility], [facility] ONLY)  GC/CHLAMYDIA PROBE AMP ([facility]) NOT AT [facility]    EKG  None  Radiology No results found.  Procedures Procedures (including critical care time)  Medications Ordered in ED Medications - No data to display   Initial Impression / Assessment and Plan / ED Course  I have reviewed the triage vital signs and the nursing notes.  Pertinent labs & imaging results that were available during my care of the patient were reviewed by me and considered in my medical decision making (see chart for details).     Patient with concerns over increased stress, anxiety, depression and thoughts of harming herself.  She admits to smoking marijuana earlier, does appear to still be under the influence of some substances.  As she reports suicidality, will require psychiatric evaluation.  Will order STD testing.  Final Clinical Impressions(s) / ED Diagnoses   Final diagnoses:  Anxiety  Suicidal ideation    ED Discharge Orders    None       Faige Seely, Canary Brim, MD 05/19/18 [id number]

## 2018-05-19 NOTE — ED Notes (Addendum)
Lab called to draw ethanol and I-stat HCG. Attempted x4 by RN and nurse tech, blood clotted in tube.

## 2018-05-19 NOTE — ED Notes (Signed)
Patient pleasant and cooperative with care at this time. Pt aware she is being admitted to [facility] and is accepting of this. Pt with complaints of anxiety, but denies any thoughts of suicide, homicide, or hurting herself. Patient verbally contracts for safety. No distress noted.

## 2018-05-19 NOTE — ED Notes (Signed)
CHARGE MORGAN RN MADE AWARE OF PT PLAN OF CARE TO MOVE TO SAP[facility]. TO REQUEST ASSISTANCE IN INQUIRING IF SAPPU APPROPRIATE AT THIS TIME TO MOVE. CHARGE RN STATES SHE WOULD ASSIST. SITTER STILL PENDING

## 2018-05-20 DIAGNOSIS — F4310 Post-traumatic stress disorder, unspecified: Secondary | ICD-10-CM

## 2018-05-20 DIAGNOSIS — Z818 Family history of other mental and behavioral disorders: Secondary | ICD-10-CM

## 2018-05-20 DIAGNOSIS — R443 Hallucinations, unspecified: Secondary | ICD-10-CM

## 2018-05-20 DIAGNOSIS — G4700 Insomnia, unspecified: Secondary | ICD-10-CM

## 2018-05-20 DIAGNOSIS — F250 Schizoaffective disorder, bipolar type: Secondary | ICD-10-CM | POA: Diagnosis present

## 2018-05-20 DIAGNOSIS — F1210 Cannabis abuse, uncomplicated: Secondary | ICD-10-CM

## 2018-05-20 DIAGNOSIS — R45851 Suicidal ideations: Secondary | ICD-10-CM

## 2018-05-20 MED ORDER — TRAZODONE HCL 50 MG PO TABS
50.0000 mg | ORAL_TABLET | Freq: Every evening | ORAL | Status: DC | PRN
  Administered 2018-05-22: 50 mg via ORAL
  Filled 2018-05-20 (×3): qty 1

## 2018-05-20 MED ORDER — ARIPIPRAZOLE 15 MG PO TABS
15.0000 mg | ORAL_TABLET | Freq: Every day | ORAL | Status: DC
  Administered 2018-05-20 – 2018-05-22 (×3): 15 mg via ORAL
  Filled 2018-05-20 (×4): qty 1

## 2018-05-20 MED ORDER — ALUM & MAG HYDROXIDE-SIMETH 200-200-20 MG/5ML PO SUSP: 30.0000 mL | ORAL | Status: DC | PRN

## 2018-05-20 MED ORDER — HYDROXYZINE HCL 50 MG PO TABS
50.0000 mg | ORAL_TABLET | Freq: Four times a day (QID) | ORAL | Status: DC | PRN
  Administered 2018-05-21 – 2018-05-23 (×5): 50 mg via ORAL
  Filled 2018-05-20 (×3): qty 1
  Filled 2018-05-20: qty 10
  Filled 2018-05-20 (×2): qty 1

## 2018-05-20 MED ORDER — MAGNESIUM HYDROXIDE 400 MG/5ML PO SUSP: 30.0000 mL | Freq: Every day | ORAL | Status: DC | PRN

## 2018-05-20 MED ORDER — SERTRALINE HCL 50 MG PO TABS
50.0000 mg | ORAL_TABLET | Freq: Every day | ORAL | Status: DC
  Administered 2018-05-20 – 2018-05-24 (×5): 50 mg via ORAL
  Filled 2018-05-20 (×3): qty 1
  Filled 2018-05-20: qty 7
  Filled 2018-05-20 (×4): qty 1

## 2018-05-20 NOTE — Tx Team (Signed)
Interdisciplinary Treatment and Diagnostic Plan Update  05/20/2018 Time of Session: 1:50 PM  Anne Richardson MRN: [id number]  Principal Diagnosis: <principal problem not specified>  Secondary Diagnoses: Active Problems:   Schizoaffective disorder, bipolar type (HCC)   Current Medications:  Current Facility-Administered Medications  Medication Dose Route Frequency Provider Last Rate Last Dose  . acetaminophen (TYLENOL) tablet 650 mg  650 mg Oral Q6H PRN Dara Hoyer, PA-C   650 mg at 05/20/18 8811  . alum & mag hydroxide-simeth (MAALOX/MYLANTA) 200-200-20 MG/5ML suspension 30 mL  30 mL Oral Q4H PRN Maris Berger T, MD      . ARIPiprazole (ABILIFY) tablet 15 mg  15 mg Oral Daily Pennelope Bracken, MD   15 mg at 05/20/18 1124  . hydrOXYzine (ATARAX/VISTARIL) tablet 50 mg  50 mg Oral Q6H PRN Pennelope Bracken, MD      . magnesium hydroxide (MILK OF MAGNESIA) suspension 30 mL  30 mL Oral Daily PRN Pennelope Bracken, MD      . sertraline (ZOLOFT) tablet 50 mg  50 mg Oral Daily Pennelope Bracken, MD   50 mg at 05/20/18 1124  . traZODone (DESYREL) tablet 50 mg  50 mg Oral QHS PRN Pennelope Bracken, MD        PTA Medications: Medications Prior to Admission  Medication Sig Dispense Refill Last Dose  . cyclobenzaprine (FLEXERIL) 10 MG tablet Take 1 tablet (10 mg total) by mouth 3 (three) times daily as needed for muscle spasms. (Patient not taking: Reported on 05/19/2018) 20 tablet 0 Not Taking at Unknown time  . dicloxacillin (DYNAPEN) 500 MG capsule Take 1 capsule (500 mg total) by mouth 3 (three) times daily. (Patient not taking: Reported on 05/19/2018) 21 capsule 0 Not Taking at Unknown time  . ibuprofen (ADVIL,MOTRIN) 600 MG tablet Take 1 tablet (600 mg total) by mouth every 6 (six) hours. (Patient not taking: Reported on 05/19/2018) 30 tablet 0 Not Taking at Unknown time  . oxyCODONE (OXY IR/ROXICODONE) 5 MG immediate release tablet Take 1  tablet (5 mg total) by mouth every 4 (four) hours as needed (pain scale 4-7). (Patient not taking: Reported on 05/19/2018) 10 tablet 0 Not Taking at Unknown time  . senna-docusate (SENOKOT-S) 8.6-50 MG tablet Take 1 tablet by mouth at bedtime as needed for mild constipation. (Patient not taking: Reported on 05/19/2018) 30 tablet 0 Not Taking at Unknown time    Patient Stressors: Financial difficulties Marital or family conflict Other: child care issues  Patient Strengths: Ability for insight Average or above average intelligence Capable of independent living General fund of knowledge  Treatment Modalities: Medication Management, Group therapy, Case management,  1 to 1 session with clinician, Psychoeducation, Recreational therapy.   Physician Treatment Plan for Primary Diagnosis: <principal problem not specified> Long Term Goal(s): Improvement in symptoms so as ready for discharge  Short Term Goals:    Medication Management: Evaluate patient's response, side effects, and tolerance of medication regimen.  Therapeutic Interventions: 1 to 1 sessions, Unit Group sessions and Medication administration.  Evaluation of Outcomes: Progressing  Physician Treatment Plan for Secondary Diagnosis: Active Problems:   Schizoaffective disorder, bipolar type ([place])   Long Term Goal(s): Improvement in symptoms so as ready for discharge  Short Term Goals:    Medication Management: Evaluate patient's response, side effects, and tolerance of medication regimen.  Therapeutic Interventions: 1 to 1 sessions, Unit Group sessions and Medication administration.  Evaluation of Outcomes: Progressing   RN Treatment Plan for Primary Diagnosis: <  principal problem not specified> Long Term Goal(s): Knowledge of disease and therapeutic regimen to maintain health will improve  Short Term Goals: Ability to verbalize feelings will improve, Ability to disclose and discuss suicidal ideas and Compliance with  prescribed medications will improve  Medication Management: RN will administer medications as ordered by provider, will assess and evaluate patient's response and provide education to patient for prescribed medication. RN will report any adverse and/or side effects to prescribing provider.  Therapeutic Interventions: 1 on 1 counseling sessions, Psychoeducation, Medication administration, Evaluate responses to treatment, Monitor vital signs and CBGs as ordered, Perform/monitor CIWA, COWS, AIMS and Fall Risk screenings as ordered, Perform wound care treatments as ordered.  Evaluation of Outcomes: Progressing   LCSW Treatment Plan for Primary Diagnosis: <principal problem not specified> Long Term Goal(s): Safe transition to appropriate next level of care at discharge, Engage patient in therapeutic group addressing interpersonal concerns.  Short Term Goals: Engage patient in aftercare planning with referrals and resources  Therapeutic Interventions: Assess for all discharge needs, 1 to 1 time with Social worker, Explore available resources and support systems, Assess for adequacy in community support network, Educate family and significant other(s) on suicide prevention, Complete Psychosocial Assessment, Interpersonal group therapy.  Evaluation of Outcomes: Met  Return home, follow up [facility] with the support of TCT   Progress in Treatment: Attending groups: Yes Participating in groups: Yes Taking medication as prescribed: Yes Toleration medication: Yes, no side effects reported at this time Family/Significant other contact made: Yes Patient understands diagnosis: Yes AEB asking for help with depressive symptoms Discussing patient identified problems/goals with staff: Yes Medical problems stabilized or resolved: Yes Denies suicidal/homicidal ideation: Yes Issues/concerns per patient self-inventory: None Other: N/A  New problem(s) identified: None identified at this time.   New Short  Term/Long Term Goal(s):"I need help with crying all the time, and feeling helpless and hopeless."  Discharge Plan or Barriers:   Reason for Continuation of Hospitalization:  Depression SI Medication stabilization   Estimated Length of Stay: 5/31  Attendees: Patient: Anne Richardson 05/20/2018  1:50 PM  Physician: Maris Berger, MD 05/20/2018  1:50 PM  Nursing: Sena Hitch, RN 05/20/2018  1:50 PM  RN Care Manager: Lars Pinks, RN 05/20/2018  1:50 PM  Social Worker: Ripley Fraise 05/20/2018  1:50 PM  Recreational Therapist: Winfield Cunas 05/20/2018  1:50 PM  Other: Norberto Sorenson 05/20/2018  1:50 PM  Other:  05/20/2018  1:50 PM    Scribe for Treatment Team:  Roque Lias LCSW 05/20/2018 1:50 PM

## 2018-05-20 NOTE — Progress Notes (Signed)
CSW left message for Charlann NossJamie Fuller/[facility] (pt's case managers) at [facility] at [phone] regarding pt's hospitalization and for aftercare coordination. [facility]. [facility] or [facility] until after June 17th--pt instructed to call clinic on that date to schedule PCP appt.  Lashon Beringer S. Alan RipperHolloway, MSW, LCSW Clinical Social Worker 05/20/2018 2:07 PM
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
## 2018-05-20 NOTE — Progress Notes (Signed)
D: pt found in her bed. This Clinical research associate concerned because the pt rated her depression/hopelessness/anxiety all 10/10 and expressed having physical pain of an 8/10. Pt also states she has been thinking about hurting herself today. This Clinical research associate spoke to her and the patient verbally agrees to approach staff if she feels like she is going to hurt herself. Pt states that if she did hurt herself she would cut. Pt again verbally agrees to approach staff before this happens. Pt's goal for today is to stop crying, stop thoughts and voices. Pt denies hearing any vh/ah at this time. Pt stated she is going to achieve this by thinking positively.  A: support and encouragement given. Pt still rates her headache an 8/10. Will speak with the doctor. Medication provided by previous shift for headache.  R: pt safe on the unit. Will continue to monitor.

## 2018-05-20 NOTE — Plan of Care (Signed)
Pt progressing in the following metrics. Pt describes having passive si but verbally agrees to approach staff before acting on this. Tech updated. Q29m checks implemented and continued. Pt rates her depression/hopelessness/anxiety all 10/10 and her pain (headache) an 8/10. Pt given medication for pain but pt states it didn't help. Pt safe on the unit. Will continue to monitor.  Problem: Education: Goal: Mental status will improve Outcome: Progressing Goal: Verbalization of understanding the information provided will improve Outcome: Progressing   Problem: Activity: Goal: Sleeping patterns will improve Outcome: Progressing   Problem: Coping: Goal: Ability to verbalize frustrations and anger appropriately will improve Outcome: Progressing Goal: Ability to demonstrate self-control will improve Outcome: Progressing   Problem: Health Behavior/Discharge Planning: Goal: Compliance with treatment plan for underlying cause of condition will improve Outcome: Progressing   Problem: Safety: Goal: Periods of time without injury will increase Outcome: Progressing   Problem: Medication: Goal: Compliance with prescribed medication regimen will improve Outcome: Progressing   Problem: Self-Concept: Goal: Ability to disclose and discuss suicidal ideas will improve Outcome: Progressing Goal: Will verbalize positive feelings about self Outcome: Progressing   Problem: Self-Concept: Goal: Level of anxiety will decrease Outcome: Progressing   Problem: Coping: Goal: Will verbalize feelings Outcome: Progressing   Problem: Safety: Goal: Ability to remain free from injury will improve Outcome: Progressing

## 2018-05-20 NOTE — Progress Notes (Signed)
Did not attend group 
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
## 2018-05-20 NOTE — Progress Notes (Signed)
Recreation Therapy Notes  Date: 5.31.19 Time: 1000 Location: 500 Hall Dayroom  Group Topic: Wellness  Goal Area(s) Addresses:  Patient will define components of whole wellness. Patient will verbalize benefit of whole wellness.  Intervention:  Music  Activity:  Exercise.  LRT let each patient pick an exercise(ie toe touches, jumping jacks, toe raises)  to lead the group through.  Patients completed four rounds of exercises.  Education: Wellness, Discharge Planning.   Education Outcome: Acknowledges education/In group clarification offered/Needs additional education.   Clinical Observations/Feedback: Pt did not attend group.    Hye Trawick, LRT/CTRS         Tonia Avino A 05/20/2018 11:54 AM 

## 2018-05-20 NOTE — B[facility] Group Notes (Signed)
[facility] LCSW Group Therapy  05/20/2018  1:05 PM  Type of Therapy:  Group therapy  Participation Level:  Active  Participation Quality:  Attentive  Affect:  Flat  Cognitive:  Oriented  Insight:  Limited  Engagement in Therapy:  Limited  Modes of Intervention:  Discussion, Socialization  Summary of Progress/Problems:  Chaplain was here to lead a group on themes of hope and courage. "Hope is my children.  Watching them get older.  They bring me hope."  Smiling as she talks about them. Snoozed for much of the time.  Stated the meds are making her drowsey.  Daryel Gerald B 05/20/2018 1:30 PM

## 2018-05-20 NOTE — Plan of Care (Signed)
D: Pt stayed in her room most of the evening. Pt forwarded little information at this time A: Pt was offered support and encouragement. Pt was given scheduled medications. Pt was encourage to attend groups. Q 15 minute checks were done for safety.  R: safety maintained on unit.   Problem: Safety: Goal: Periods of time without injury will increase Outcome: Progressing

## 2018-05-20 NOTE — Progress Notes (Signed)
Recreation Therapy Notes  INPATIENT RECREATION THERAPY ASSESSMENT  Patient Details Name: Anne Richardson MRN: [id number] DOB: August 09, 1986 Today's Date: 05/20/2018       Information Obtained From: Patient  Able to Participate in Assessment/Interview: Yes  Patient Presentation: Alert, Oriented  Reason for Admission (Per Patient): Other (Comments)(Anxiety attack; depression)  Patient Stressors: Other (Comment)(Homeless with 5 small kids, unemployed, no support system)  Coping Skills:   Isolation, Self-Injury, TV, Arguments, Aggression, Music, Exercise, Substance Abuse, Impulsivity, Talk, Prayer, Avoidance, Intrusive Behavior, Read, Hot Bath/Shower  Leisure Interests (2+):  Individual - Reading  Frequency of Recreation/Participation: Other (Comment)(Never)  Awareness of Community Resources:  Yes  Community Resources:  Library, [employer]  Current Use: Yes  Expressed Interest in [employer] Information: Yes  [place] of Residence:  [place]  Patient Main Form of Transportation: Therapist, music  Patient Strengths:  Data processing manager; work Programmer, applications  Patient Identified Areas of Improvement:  Attitude  Patient Goal for Hospitalization:  "Get help with depression and clarification about panic attacks"  Current SI (including self-harm):  No  Current HI:  Yes(Rated a 10; towards kids dad)  Current AVH: Yes(Pt stated she was seeing bugs)  Staff Intervention Plan: Group Attendance, Collaborate with Interdisciplinary Treatment Team  Consent to Intern Participation: N/A     Caroll Rancher, LRT/CTRS   [name], Shalva Rozycki A 05/20/2018, 1:01 PM

## 2018-05-20 NOTE — B[facility] Suicide Risk Assessment (Addendum)
[facility] INPATIENT:  Family/Significant Other Suicide Prevention Education  Suicide Prevention Education:  Contact Attempts: Lillia Dallas (pt's aunt) [phone] has been identified by the patient as the family member/significant other with whom the patient will be residing, and identified as the person(s) who will aid the patient in the event of a mental health crisis.  With written consent from the patient, two attempts were made to provide suicide prevention education, prior to and/or following the patient's discharge.  We were unsuccessful in providing suicide prevention education.  A suicide education pamphlet was given to the patient to share with family/significant other.  Date and time of first attempt: 10:40AM 05/20/18 Date and time of second attempt: 1:50PM (unable to leave voicemail) 5/31/9 Aunt would like pt to come stay in [place] as that is where she is located, and has actually taken the pt's girls in, but pt cannot stay there as aunt has section 8 housing.  Also, aunt just talked to Tree surgeon at [employer] who assured her that they will continue to work with pt once she is discharged to put together a comprehensive plan for transfer with children to [place]. Ethel Rana Holloway LCSW 05/20/2018, 1:50 PM

## 2018-05-21 DIAGNOSIS — Z810 Family history of intellectual disabilities: Secondary | ICD-10-CM

## 2018-05-21 DIAGNOSIS — Z811 Family history of alcohol abuse and dependence: Secondary | ICD-10-CM

## 2018-05-21 LAB — TSH: TSH: 1.854 u[IU]/mL (ref 0.350–4.500)

## 2018-05-21 LAB — LIPID PANEL
CHOL/HDL RATIO: 4.6 ratio
Cholesterol: 226 mg/dL — ABNORMAL HIGH (ref 0–200)
HDL: 49 mg/dL (ref >40)
LDL CALC: 157 mg/dL — AB (ref 0–99)
Triglycerides: 98 mg/dL (ref <150)
VLDL: 20 mg/dL (ref 0–40)

## 2018-05-21 LAB — HEMOGLOBIN A1C
Hgb A1c MFr Bld: 5.6 % (ref 4.8–5.6)
Mean Plasma Glucose: 114.02 mg/dL

## 2018-05-21 MED ORDER — ONDANSETRON 4 MG PO TBDP: 4.0000 mg | ORAL_TABLET | Freq: Four times a day (QID) | ORAL | Status: DC | PRN

## 2018-05-21 NOTE — Plan of Care (Signed)
D: Pt denies SI/HI/AVH . Pt is pleasant and cooperative. Pt stated she was doing ok this evening, pt was depressed earlier due to not being able to see her kids, pt stated she was working on her anger issues. 1:1 time spent with pt with emphasis on anger and consequences.   A: Pt was offered support and encouragement. Pt was given scheduled medications. Pt was encourage to attend groups. Q 15 minute checks were done for safety.   R:Pt attends groups and interacts well with peers and staff. Pt is taking medication. Pt has no complaints.Pt receptive to treatment and safety maintained on unit.   Problem: Coping: Goal: Coping ability will improve Outcome: Progressing   Problem: Education: Goal: Mental status will improve Outcome: Progressing   Problem: Activity: Goal: Sleeping patterns will improve Outcome: Progressing   Problem: Self-Concept: Goal: Level of anxiety will decrease Outcome: Progressing

## 2018-05-21 NOTE — B[facility] Group Notes (Signed)
[facility] Group Notes:  (Nursing/MHT/Case Management/Adjunct)  Date:  05/21/2018  Time:  10:47 AM  Type of Therapy:  Psychoeducational Skills  Participation Level:  Active  Participation Quality:  Appropriate and Attentive  Affect:  Appropriate  Cognitive:  Alert and Appropriate  Insight:  Appropriate and Good  Engagement in Group:  Engaged  Modes of Intervention:  Discussion and Education  Summary of Progress/Problems:  Discussed Anger management.  Patient was attentive and receptive.  Anne Richardson 05/21/2018, 10:47 AM

## 2018-05-21 NOTE — B[facility] Group Notes (Signed)
  [facility] LCSW Group Therapy Note  Date/Time:  05/21/2018 11:15AM-12:00PM  Type of Therapy and Topic:  Group Therapy:  Feelings About Hospitalization  Participation Level:  Active   Description of Group This process group involved patients discussing their feelings related to being hospitalized, as well as the benefits they see to being in the hospital.  These feelings and benefits were itemized.  The group then brainstormed specific ways in which they could seek those same benefits when they discharge and return home.  Therapeutic Goals 1. Patient will identify and describe positive and negative feelings related to hospitalization 2. Patient will verbalize benefits of hospitalization to themselves personally 3. Patients will brainstorm together ways they can obtain similar benefits in the outpatient setting, identify barriers to wellness and possible solutions  Summary of Patient Progress:  The patient expressed her primary feelings about being hospitalized are that she does not like it, "the food is garbage and I can't see my kids," but said she is finally getting help which she has "probably needed my whole life."  She stated to stay well and not have to return a second time, she plans to follow her aftercare plan as outlined by the staff.  Therapeutic Modalities Cognitive Behavioral Therapy Motivational Interviewing    Ambrose MantleMareida Grossman-Orr, LCSW 05/21/2018, 1:16 PM  .

## 2018-05-21 NOTE — Progress Notes (Signed)
[facility] MD Progress Note  05/21/2018 2:55 PM Anne Richardson  MRN:  [id number]  Anne Richardson is awake alert oriented x3.  Seen resting in bed.  Reports feeling tired due to starting new medications.  Reports she had a panic/anxiety attack on yesterday which is why she was hospitalized. Reports  previous inpatient admissions" a while ago." Denies that she has been medication compliant.  Patient is unable to call medications that she was started on in the past.  States she experience auditory hallucination that are intermittent appears to be minimizing symptoms.  Currently denying depression or depressive symptoms.  Patient was initiated on Abilify reports taking and tolerating medications well.  Discussed initiating long-acting Abilify patient was receptive to treatment plan.  Support encouragement reassurance was provided.   History: Anne Richardson is a 32 y/o F with no formal psychiatric diagnoses who presented voluntarily from [facility] where she presented brought in by [facility] from [facility] where she has been staying due to anxiety, agitation, and disorganized behaviors as well as being under the influence of cannabis. In ED she reported depression and SI regarding her social situation, and she was medically cleared and then transferred to [facility] for additional treatment and evaluation.      Principal Problem: Schizoaffective disorder, bipolar type (HCC) Diagnosis:   Patient Active Problem List   Diagnosis Date Noted  . Schizoaffective disorder, bipolar type (HCC) [F25.0] 05/20/2018  . PTSD (post-traumatic stress disorder) [F43.10] 05/20/2018  . Cannabis abuse [F12.10] 05/20/2018  . Status post repeat low transverse cesarean section [Z98.891] 07/24/2017  . Insufficient prenatal care in third trimester [O09.33] 07/20/2017  . Supervision of other normal pregnancy, antepartum [Z34.80] 07/20/2017  . Previous cesarean section [Z98.891] 03/21/2016   Total Time spent with patient: 20  minutes  Past Psychiatric History:   Past Medical History:  Past Medical History:  Diagnosis Date  . Pregnancy induced hypertension     Past Surgical History:  Procedure Laterality Date  . BIOPSY BREAST    . BREAST LUMPECTOMY     left  . CESAREAN SECTION    . CESAREAN SECTION  06/28/2012   Procedure: CESAREAN SECTION;  Surgeon: Lesly Dukes, MD;  Location: [facility] ORS;  Service: Gynecology;  Laterality: N/A;  . CESAREAN SECTION N/A 03/21/2016   Procedure: CESAREAN SECTION;  Surgeon: Lazaro Arms, MD;  Location: [facility];  Service: Obstetrics;  Laterality: N/A;  . CESAREAN SECTION N/A 07/24/2017   Procedure: CESAREAN SECTION;  Surgeon: [name], MD;  Location: [facility] BIRTHING SU[facility];  Service: Obstetrics;  Laterality: N/A;  . WISDOM TOOTH EXTRACTION     age 21   Family History:  Family History  Problem Relation Age of Onset  . Cancer Mother 61       breast  . Alcohol abuse Mother   . Depression Mother   . Hypertension Mother   . Depression Sister   . Hypertension Sister   . Asthma Brother   . Depression Brother   . Hypertension Brother   . Alcohol abuse Maternal Aunt   . Hypertension Maternal Aunt   . Cancer Maternal Aunt 65       breast ca  . Alcohol abuse Maternal Uncle   . Hypertension Maternal Uncle   . Heart disease Maternal Grandmother   . Alcohol abuse Maternal Grandmother   . Hypertension Maternal Grandmother   . Heart disease Maternal Grandfather   . Alcohol abuse Maternal Grandfather   . Hypertension Maternal Grandfather   . Cancer  Maternal Grandfather        prostate and bone  . Depression Paternal Grandfather   . Alzheimer's disease Paternal Grandmother   . Diabetes Paternal Grandmother    Family Psychiatric  History:  Social History:  Social History   Substance and Sexual Activity  Alcohol Use No     Social History   Substance and Sexual Activity  Drug Use Yes  . Types: Marijuana   Comment: Last used at 17:00 today     Social History    Socioeconomic History  . Marital status: Single    Spouse name: Not on file  . Number of children: Not on file  . Years of education: Not on file  . Highest education level: Not on file  Occupational History  . Not on file  Social Needs  . Financial resource strain: Not on file  . Food insecurity:    Worry: Not on file    Inability: Not on file  . Transportation needs:    Medical: Not on file    Non-medical: Not on file  Tobacco Use  . Smoking status: Never Smoker  . Smokeless tobacco: Never Used  Substance and Sexual Activity  . Alcohol use: No  . Drug use: Yes    Types: Marijuana    Comment: Last used at 17:00 today   . Sexual activity: Not Currently    Birth control/protection: None  Lifestyle  . Physical activity:    Days per week: Not on file    Minutes per session: Not on file  . Stress: Not on file  Relationships  . Social connections:    Talks on phone: Not on file    Gets together: Not on file    Attends religious service: Not on file    Active member of club or organization: Not on file    Attends meetings of clubs or organizations: Not on file    Relationship status: Not on file  Other Topics Concern  . 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 acetaminophen (TYLENOL) tablet 650 mg  650 mg Oral Q6H PRN Court Joy, PA-C   650 mg at 05/21/18 1016  . alum & mag hydroxide-simeth (MAALOX/MYLANTA) 200-200-20 MG/5ML suspension 30 mL  30 mL Oral Q4H PRN Micheal Likens, MD      . ARIPiprazole (ABILIFY) tablet 15 mg  15 mg Oral Daily Micheal Likens, MD   15 mg at 05/21/18 0827  .  hydrOXYzine (ATARAX/VISTARIL) tablet 50 mg  50 mg Oral Q6H PRN Micheal Likens, MD      . magnesium hydroxide (MILK OF MAGNESIA) suspension 30 mL  30 mL Oral Daily PRN Micheal Likens, MD      . ondansetron (ZOFRAN-ODT) disintegrating tablet 4 mg  4 mg Oral Q6H PRN Kerry Hough, PA-C      . sertraline (ZOLOFT) tablet 50 mg  50 mg Oral Daily Micheal Likens, MD   50 mg at 05/21/18 0827  . traZODone (DESYREL) tablet 50 mg  50 mg Oral QHS  PRN Micheal Likens, MD        Lab Results:  Results for orders placed or performed during the hospital encounter of 05/19/18 (from the past 48 hour(s))  TSH     Status: None   Collection Time: 05/21/18  6:32 AM  Result Value Ref Range   TSH 1.854 0.350 - 4.500 uIU/mL    Comment: Performed by a 3rd Generation assay with a functional sensitivity of <=0.01 uIU/mL. Performed at [facility], [address] [place], [place] [address]   Lipid panel     Status: Abnormal   Collection Time: 05/21/18  6:32 AM  Result Value Ref Range   Cholesterol 226 (H) 0 - 200 mg/dL   Triglycerides 98 <409 mg/dL   HDL 49 >81 mg/dL   Total CHOL/HDL Ratio 4.6 RATIO   VLDL 20 0 - 40 mg/dL   LDL Cholesterol 191 (H) 0 - 99 mg/dL    Comment:        Total Cholesterol/HDL:CHD Risk Coronary Heart Disease Risk Table                     Men   Women  1/2 Average Risk   3.4   3.3  Average Risk       5.0   4.4  2 X Average Risk   9.6   7.1  3 X Average Risk  23.4   11.0        Use the calculated Patient Ratio above and the CHD Risk Table to determine the patient's CHD Risk.        ATP III CLASSIFICATION (LDL):  <100     mg/dL   Optimal  478-295  mg/dL   Near or Above                    Optimal  130-159  mg/dL   Borderline  621-308  mg/dL   High  >657     mg/dL   Very High Performed at [facility], [address], [place], [place] [address]     Blood Alcohol level:  Lab Results  Component Value  Date   ETH <10 05/19/2018    Metabolic Disorder Labs: Lab Results  Component Value Date   HGBA1C 5.8 (H) 07/20/2017   No results found for: PROLACTIN Lab Results  Component Value Date   CHOL 226 (H) 05/21/2018   TRIG 98 05/21/2018   HDL 49 05/21/2018   CHOLHDL 4.6 05/21/2018   VLDL 20 05/21/2018   LDLCALC 157 (H) 05/21/2018    Physical Findings: AIMS: Facial and Oral Movements Muscles of Facial Expression: None, normal Lips and Perioral Area: None, normal Jaw: None, normal Tongue: None, normal,Extremity Movements Upper (arms, wrists, hands, fingers): None, normal Lower (legs, knees, ankles, toes): None, normal, Trunk Movements Neck, shoulders, hips: None, normal, Overall Severity Severity of abnormal movements (highest score from questions above): None, normal Incapacitation due to abnormal movements: None, normal Patient's awareness of abnormal movements (rate only patient's report): No Awareness, Dental Status Current problems with teeth and/or dentures?: No Does patient usually wear dentures?: No  CIWA:    COWS:     Musculoskeletal: Strength & Muscle Tone: within normal limits Gait & Station: normal Patient leans: N/A  Psychiatric Specialty Exam: Physical Exam  Constitutional: She appears well-developed.  Psychiatric: She has a normal mood and affect.    Review of Systems  Psychiatric/Behavioral: Positive for depression and hallucinations. The patient is nervous/anxious.  Blood pressure (!) 125/96, pulse 87, temperature 97.9 F (36.6 C), temperature source Oral, resp. rate 16, height 5\' 5"  (1.651 m), weight 98.9 kg (218 lb), unknown if currently breastfeeding.Body mass index is 36.28 kg/m.  General Appearance: Casual  Eye Contact:  Good  Speech:  Clear and Coherent  Volume:  Normal  Mood:  Anxious and Depressed  Affect:  Congruent  Thought Process:  Coherent  Orientation:  Full (Time, Place, and Person)  Thought Content:  Hallucinations: None   Suicidal Thoughts:  No  Homicidal Thoughts:  No  Memory:  Immediate;   Fair Recent;   Fair Remote;   Fair  Judgement:  Fair  Insight:  Fair  Psychomotor Activity:  Normal  Concentration:  Concentration: Fair  Recall:  [employer]Fair  Fund of Knowledge:  Fair  Language:  Fair  Akathisia:  No  Handed:  Right  AIMS (if indicated):     Assets:  Communication Skills Desire for Improvement Resilience Social Support  ADL's:  Intact  Cognition:  WNL  Sleep:  Number of Hours: 5.5     Treatment Plan Summary: Daily contact with patient to assess and evaluate symptoms and progress in treatment and Medication management  Continue with current treatment plan as listed below on 05/21/2018 except were noted  Mood stabilization:   Continue Abilify 15 mg p.o. Daily - consider initiating Abilify maintena 400mg  IM  Continue Zoloft 50 mg p.o. Daily   Insomnia: Continue with Trazodone 50 mg   Will continue to monitor vitals ,medication compliance and treatment side effects while patient is here.  Reviewed labslipid panel elevated cholesterol,BAL - 198, UDS - positive thc  CSW to continue working on disposition.  Patient to participate in therapeutic milieu  Anne Rackanika N Abbrielle Batts, NP 05/21/2018, 2:55 PM

## 2018-05-21 NOTE — Plan of Care (Signed)
  Problem: Activity: Goal: Interest or engagement in activities will improve Outcome: Progressing   Problem: Medication: Goal: Compliance with prescribed medication regimen will improve Outcome: Progressing   Problem: Safety: Goal: Ability to disclose and discuss suicidal ideas will improve Outcome: Progressing D: Pt A & O X3. Visible in hallway on initial approach. Denies HI, AVH and pain. Endorsed passive SI, verbally contracts for safety. Presents anxious and worried. Reports fair sleep with fair appetite, low energy and good concentration level. Rates her depression 6/10, anxiety 7/10 and hopelessness all 6/10 "now they're telling me they will not bring my baby because they've been out all day, that's stressing me out too".  A: Introduced self to pt. Emotional support and availability provided to pt. Encouraged pt to voice concerns, attend to ADLs and comply with current treatment regimen. Scheduled and PRN (Vistaril--Anxiety) medications administered with verbal education and effects monitored. Safety checks maintained at Q 15 minutes intervals without outburst or self harm gestures.   R: Pt receptive to care. Compliant with medications when offered. Denies adverse drug reactions when assessed this shift. Showered and changed scrubs. Attended and participated in groups. Tolerates all PO intake well. Remains safe on and off unit.

## 2018-05-22 LAB — PROLACTIN: PROLACTIN: 23.4 ng/mL — AB (ref 4.8–23.3)

## 2018-05-22 MED ORDER — ARIPIPRAZOLE 10 MG PO TABS
20.0000 mg | ORAL_TABLET | Freq: Every day | ORAL | Status: DC
  Administered 2018-05-23 – 2018-05-24 (×2): 20 mg via ORAL
  Filled 2018-05-22: qty 14
  Filled 2018-05-22 (×4): qty 2

## 2018-05-22 MED ORDER — LIDOCAINE 5 % EX PTCH
1.0000 | MEDICATED_PATCH | CUTANEOUS | Status: DC
  Administered 2018-05-22 – 2018-05-24 (×3): 1 via TRANSDERMAL
  Filled 2018-05-22 (×3): qty 1
  Filled 2018-05-22: qty 7
  Filled 2018-05-22: qty 1

## 2018-05-22 NOTE — Progress Notes (Addendum)
[facility] MD Progress Note  05/22/2018 11:32 AM Anne Richardson  MRN:  [id number]  Anne Richardson sitting in day room interacting with peers.  Reports " I am feeling okay but I am just  having back pain from the bed." Continues to report auditory hallucinations states they are quieter today since she is taking her medications.  Patient continues to endorse homicidal ideations towards the father of her children, without intent or plan..  Reports she is feeling hopeful as her children are coming to see her during  family visitation hours. Anne Richardson has concerns with a court date on Tuesday, discussed follow-up with social work.  Denies suicidal ideations or self-injurious behaviors.  States she is taking medications as prescribed and tolerating them well discussed initiating long-acting Abilify patient was agreeable to treatment.  Support encouragement reassurance was provided.   History: Anne Richardson is a 32 y/o F with no formal psychiatric diagnoses who presented voluntarily from [facility] where she presented brought in by [facility] from [facility] where she has been staying due to anxiety, agitation, and disorganized behaviors as well as being under the influence of cannabis. In ED she reported depression and SI regarding her social situation, and she was medically cleared and then transferred to [facility] for additional treatment and evaluation.    Principal Problem: Schizoaffective disorder, bipolar type (HCC) Diagnosis:   Patient Active Problem List   Diagnosis Date Noted  . Schizoaffective disorder, bipolar type (HCC) [F25.0] 05/20/2018  . PTSD (post-traumatic stress disorder) [F43.10] 05/20/2018  . Cannabis abuse [F12.10] 05/20/2018  . Status post repeat low transverse cesarean section [Z98.891] 07/24/2017  . Insufficient prenatal care in third trimester [O09.33] 07/20/2017  . Supervision of other normal pregnancy, antepartum [Z34.80] 07/20/2017  . Previous cesarean section [Z98.891]  03/21/2016   Total Time spent with patient: 20 minutes  Past Psychiatric History:   Past Medical History:  Past Medical History:  Diagnosis Date  . Pregnancy induced hypertension     Past Surgical History:  Procedure Laterality Date  . BIOPSY BREAST    . BREAST LUMPECTOMY     left  . CESAREAN SECTION    . CESAREAN SECTION  06/28/2012   Procedure: CESAREAN SECTION;  Surgeon: Lesly Dukes, MD;  Location: [facility];  Service: Gynecology;  Laterality: N/A;  . CESAREAN SECTION N/A 03/21/2016   Procedure: CESAREAN SECTION;  Surgeon: Lazaro Arms, MD;  Location: [facility];  Service: Obstetrics;  Laterality: N/A;  . CESAREAN SECTION N/A 07/24/2017   Procedure: CESAREAN SECTION;  Surgeon: [name], MD;  Location: [facility];  Service: Obstetrics;  Laterality: N/A;  . WISDOM TOOTH EXTRACTION     age 38   Family History:  Family History  Problem Relation Age of Onset  . Cancer Mother 57       breast  . Alcohol abuse Mother   . Depression Mother   . Hypertension Mother   . Depression Sister   . Hypertension Sister   . Asthma Brother   . Depression Brother   . Hypertension Brother   . Alcohol abuse Maternal Aunt   . Hypertension Maternal Aunt   . Cancer Maternal Aunt 65       breast ca  . Alcohol abuse Maternal Uncle   . Hypertension Maternal Uncle   . Heart disease Maternal Grandmother   . Alcohol abuse Maternal Grandmother   . Hypertension Maternal Grandmother   . Heart disease Maternal Grandfather   . Alcohol abuse Maternal Grandfather   .  Hypertension Maternal Grandfather   . Cancer Maternal Grandfather        prostate and bone  . Depression Paternal Grandfather   . Alzheimer's disease Paternal Grandmother   . Diabetes Paternal Grandmother    Family Psychiatric  History:  Social History:  Social History   Substance and Sexual Activity  Alcohol Use No     Social History   Substance and Sexual Activity  Drug Use Yes  . Types: Marijuana   Comment: Last  used at 17:00 today     Social History   Socioeconomic History  . Marital status: Single    Spouse name: Not on file  . Number of children: Not on file  . Years of education: Not on file  . Highest education level: Not on file  Occupational History  . Not on file  Social Needs  . Financial resource strain: Not on file  . Food insecurity:    Worry: Not on file    Inability: Not on file  . Transportation needs:    Medical: Not on file    Non-medical: Not on file  Tobacco Use  . Smoking status: Never Smoker  . Smokeless tobacco: Never Used  Substance and Sexual Activity  . Alcohol use: No  . Drug use: Yes    Types: Marijuana    Comment: Last used at 17:00 today   . Sexual activity: Not Currently    Birth control/protection: None  Lifestyle  . Physical activity:    Days per week: Not on file    Minutes per session: Not on file  . Stress: Not on file  Relationships  . Social connections:    Talks on phone: Not on file    Gets together: Not on file    Attends religious service: Not on file    Active member of club or organization: Not on file    Attends meetings of clubs or organizations: Not on file    Relationship status: Not on file  Other Topics Concern  . 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 acetaminophen (TYLENOL) tablet 650 mg  650 mg Oral Q6H PRN Court Joy, PA-C   650 mg at 05/22/18 0843  . alum & mag hydroxide-simeth (MAALOX/MYLANTA) 200-200-20 MG/5ML suspension 30 mL  30 mL Oral Q4H PRN Jolyne Loa T, MD      . ARIPiprazole (ABILIFY) tablet 15 mg  15 mg Oral Daily Micheal Likens, MD   15 mg at 05/22/18 0841  . hydrOXYzine (ATARAX/VISTARIL) tablet 50 mg  50 mg Oral Q6H PRN Micheal Likens, MD   50 mg at 05/21/18 1813  . lidocaine (LIDODERM) 5 % 1 patch  1 patch Transdermal Q24H Oneta Rack, NP   1 patch at 05/22/18 1111  . magnesium hydroxide (MILK OF MAGNESIA) suspension 30 mL  30 mL Oral Daily PRN Jolyne Loa T, MD      . ondansetron (ZOFRAN-ODT) disintegrating tablet 4 mg  4 mg Oral Q6H PRN Kerry Hough, PA-C      .  sertraline (ZOLOFT) tablet 50 mg  50 mg Oral Daily Micheal Likens, MD   50 mg at 05/22/18 0841  . traZODone (DESYREL) tablet 50 mg  50 mg Oral QHS PRN Micheal Likens, MD        Lab Results:  Results for orders placed or performed during the hospital encounter of 05/19/18 (from the past 48 hour(s))  Hemoglobin A1c     Status: None   Collection Time: 05/21/18  6:32 AM  Result Value Ref Range   Hgb A1c MFr Bld 5.6 4.8 - 5.6 %    Comment: (NOTE) Pre diabetes:          5.7%-6.4% Diabetes:              >6.4% Glycemic control for   <7.0% adults with diabetes    Mean Plasma Glucose 114.02 mg/dL    Comment: Performed at [facility], [address] [place], [place] [address]  TSH     Status: None   Collection Time: 05/21/18  6:32 AM  Result Value Ref Range   TSH 1.854 0.350 - 4.500 uIU/mL    Comment: Performed by a 3rd Generation assay with a functional sensitivity of <=0.01 uIU/mL. Performed at [facility], [address] [place], [place] [address]   Prolactin     Status: Abnormal   Collection Time: 05/21/18  6:32 AM  Result Value Ref Range   Prolactin 23.4 (H) 4.8 - 23.3 ng/mL    Comment: (NOTE) Performed At: [facility] [address] [place], [place] [id number] Jolene Schimke MD WG:[id number] Performed at [facility], [address] [place], [place] [address]   Lipid panel     Status: Abnormal   Collection Time: 05/21/18   6:32 AM  Result Value Ref Range   Cholesterol 226 (H) 0 - 200 mg/dL   Triglycerides 98 <629 mg/dL   HDL 49 >52 mg/dL   Total CHOL/HDL Ratio 4.6 RATIO   VLDL 20 0 - 40 mg/dL   LDL Cholesterol 841 (H) 0 - 99 mg/dL    Comment:        Total Cholesterol/HDL:CHD Risk Coronary Heart Disease Risk Table                     Men   Women  1/2 Average Risk   3.4   3.3  Average Risk       5.0   4.4  2 X Average Risk   9.6   7.1  3 X Average Risk  23.4   11.0        Use the calculated Patient Ratio above and the CHD Risk Table to determine the patient's CHD Risk.        ATP III CLASSIFICATION (LDL):  <100     mg/dL   Optimal  324-401  mg/dL   Near or Above                    Optimal  130-159  mg/dL   Borderline  027-253  mg/dL   High  >664     mg/dL   Very High Performed at [facility], [address], [place], [place] [address]     Blood Alcohol level:  Lab Results  Component Value Date   [facility] <10 05/19/2018    Metabolic Disorder Labs: Lab Results  Component Value Date   HGBA1C 5.6 05/21/2018   MPG 114.02 05/21/2018   Lab  Results  Component Value Date   PROLACTIN 23.4 (H) 05/21/2018   Lab Results  Component Value Date   CHOL 226 (H) 05/21/2018   TRIG 98 05/21/2018   HDL 49 05/21/2018   CHOLHDL 4.6 05/21/2018   VLDL 20 05/21/2018   LDLCALC 157 (H) 05/21/2018    Physical Findings: AIMS: Facial and Oral Movements Muscles of Facial Expression: None, normal Lips and Perioral Area: None, normal Jaw: None, normal Tongue: None, normal,Extremity Movements Upper (arms, wrists, hands, fingers): None, normal Lower (legs, knees, ankles, toes): None, normal, Trunk Movements Neck, shoulders, hips: None, normal, Overall Severity Severity of abnormal movements (highest score from questions above): None, normal Incapacitation due to abnormal movements: None, normal Patient's awareness of abnormal movements (rate only patient's report): No Awareness, Dental  Status Current problems with teeth and/or dentures?: No Does patient usually wear dentures?: No  CIWA:    COWS:     Musculoskeletal: Strength & Muscle Tone: within normal limits Gait & Station: normal Patient leans: N/A  Psychiatric Specialty Exam: Physical Exam  Constitutional: She appears well-developed.  Psychiatric: She has a normal mood and affect.    Review of Systems  Psychiatric/Behavioral: Positive for depression and hallucinations. The patient is nervous/anxious.   All other systems reviewed and are negative.   Blood pressure (!) 117/97, pulse (!) 107, temperature 98.8 F (37.1 C), resp. rate 20, height 5\' 5"  (1.651 m), weight 98.9 kg (218 lb), unknown if currently breastfeeding.Body mass index is 36.28 kg/m.  General Appearance: Casual  Eye Contact:  Good  Speech:  Clear and Coherent  Volume:  Normal  Mood:  Anxious and Depressed  Affect:  Congruent  Thought Process:  Coherent  Orientation:  Full (Time, Place, and Person)  Thought Content:  Hallucinations: None  Suicidal Thoughts:  No  Homicidal Thoughts:  Yes.  with intent/plan " Childrens father"   Memory:  Immediate;   Fair Recent;   Fair Remote;   Fair  Judgement:  Fair  Insight:  Fair  Psychomotor Activity:  Normal  Concentration:  Concentration: Fair  Recall:  FiservFair  Fund of Knowledge:  Fair  Language:  Fair  Akathisia:  No  Handed:  Right  AIMS (if indicated):     Assets:  Communication Skills Desire for Improvement Resilience Social Support  ADL's:  Intact  Cognition:  WNL  Sleep:  Number of Hours: 6.75     Treatment Plan Summary: Daily contact with patient to assess and evaluate symptoms and progress in treatment and Medication management  Continue with current treatment plan as listed below on 05/22/2018 except were noted  Mood stabilization:     Increased  Abilify 15 mg QD  to 20 mg  consider initiating Abilify maintena 400mg  IM    Continue Zoloft 50 mg p.o. Daily    Insomnia: Continue with Trazodone 50 mg   Will continue to monitor vitals ,medication compliance and treatment side effects while patient is here.  Reviewed labslipid panel elevated cholesterol,BAL - , UDS - positive thc  CSW to continue working on disposition.  Patient to participate in therapeutic milieu  Oneta Rackanika N Lewis, NP 05/22/2018, 11:32 AM   ..Agree with NP Progress Note

## 2018-05-22 NOTE — Plan of Care (Signed)
D: Pt denies SI/HI/AVH. Pt is pleasant and cooperative. Pt stated she had issues earlier, but was doing better this evening  A: Pt was offered support and encouragement. Pt was given scheduled medications. Pt was encourage to attend groups. Q 15 minute checks were done for safety.   R:Pt attends groups and interacts well with peers and staff. Pt is taking medication. Pt has no complaints.Pt receptive to treatment and safety maintained on unit.   Problem: Education: Goal: Emotional status will improve Outcome: Progressing   Problem: Education: Goal: Mental status will improve Outcome: Progressing   Problem: Safety: Goal: Periods of time without injury will increase Outcome: Progressing

## 2018-05-22 NOTE — [facility] Group Notes (Signed)
[facility]  Date/Time:  05/22/2018  11:00AM-12:00PM  Type of Therapy and Topic:  Group Therapy:  Music and Mood  Participation Level:  Active   Description of Group: In this process group, members listened to a variety of genres of music and identified that different types of music evoke different responses.  Patients were encouraged to identify music that was soothing for them and music that was energizing for them.  Patients discussed how this knowledge can help with wellness and recovery in various ways including managing depression and anxiety as well as encouraging healthy sleep habits.    Therapeutic Goals: 1. Patients will explore the impact of different varieties of music on mood 2. Patients will verbalize the thoughts they have when listening to different types of music 3. Patients will identify music that is soothing to them as well as music that is energizing to them 4. Patients will discuss how to use this knowledge to assist in maintaining wellness and recovery 5. Patients will explore the use of music as a coping skill  Summary of Patient Progress:  At the beginning of group, patient expressed that she felt tired and at the end of group during which she sang quite a bit, and shared consistently, she said she was "more energized."  Therapeutic Modalities: Solution Focused Brief Therapy Activity   Ambrose MantleMareida Grossman-Orr, LCSW

## 2018-05-22 NOTE — Plan of Care (Signed)
D:Patient observed in milieu interacting with peers and staff. Patient states she is feeling good today and hopeful her children will visit with her today. Patient denies SI. Patient continues to voice HI towards her dad and have auditory hallucinations. She states the voices have improved with her medication. Patient rated depression-5, hopelessness-4, and anxiety-4. A:Patient provided support and encouragement. Q 15 minute checks in progress and patient remains safe on unit. Patient administered medications as prescribed. Patient encouraged to attend groups. R:Patient is taking medications as prescribe. Patient attended group. Patient remains safe on unit.   Problem: Coping: Goal: Level of anxiety will decrease Outcome: Progressing Note:  Patient states she feels good today and rated her anxiety at a 4. She has not required any prn medication for anxiety today monitoring continues.   Problem: Safety: Goal: Ability to remain free from injury will improve Outcome: Progressing Note:  Patient denies SI and remains safe on unit.   Problem: Medication: Goal: Compliance with prescribed medication regimen will improve Note:  Patient is compliant with medication regimen.

## 2018-05-23 DIAGNOSIS — Z79899 Other long term (current) drug therapy: Secondary | ICD-10-CM

## 2018-05-23 MED ORDER — ARIPIPRAZOLE ER 400 MG IM SRER
400.0000 mg | INTRAMUSCULAR | Status: DC
  Administered 2018-05-24: 400 mg via INTRAMUSCULAR

## 2018-05-23 MED ORDER — TRAZODONE HCL 100 MG PO TABS
100.0000 mg | ORAL_TABLET | Freq: Every evening | ORAL | Status: DC | PRN
  Administered 2018-05-23: 100 mg via ORAL
  Filled 2018-05-23: qty 7

## 2018-05-23 NOTE — Progress Notes (Signed)
Recreation Therapy Notes  Date: 6.3.19 Time: 1000 Location: 500 Hall Dayroom  Group Topic: Coping Skills  Goal Area(s) Addresses:  Patient will be able to identify positive coping skills. Patient will be able to identify benefits of using coping skills post d/c.  Intervention: Worksheet, pencils  Activity: Mind map.  LRT and patients filled in the first 8 boxes of the mind map together (guilt, disappointment, sadness, anger, physical harm, depression, anxiety and financial problems).  Patients were to then come up with 3 coping skills for each problem individually before reconvening as a group.  LRT would then fill in the coping skills on the board.   Education: Coping Skills, Discharge Planning.   Education Outcome: Acknowledges understanding/In group clarification offered/Needs additional education.   Clinical Observations/Feedback: Pt did not attend group.     Bernetta Sutley, LRT/CTRS         Vania Rosero A 05/23/2018 12:01 PM 

## 2018-05-23 NOTE — Plan of Care (Addendum)
Nurse discussed depression, anxiety, coping skills with patient.  
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
## 2018-05-23 NOTE — [facility] Group Notes (Signed)
LCSW Group Therapy Note   05/23/2018 1:15pm   Type of Therapy and Topic:  Group Therapy:  Overcoming Obstacles   Participation Level:  Active   Description of Group:    In this group patients will be encouraged to explore what they see as obstacles to their own wellness and recovery. They will be guided to discuss their thoughts, feelings, and behaviors related to these obstacles. The group will process together ways to cope with barriers, with attention given to specific choices patients can make. Each patient will be challenged to identify changes they are motivated to make in order to overcome their obstacles. This group will be process-oriented, with patients participating in exploration of their own experiences as well as giving and receiving support and challenge from other group members.   Therapeutic Goals: 1. Patient will identify personal and current obstacles as they relate to admission. 2. Patient will identify barriers that currently interfere with their wellness or overcoming obstacles.  3. Patient will identify feelings, thought process and behaviors related to these barriers. 4. Patient will identify two changes they are willing to make to overcome these obstacles:      Summary of Patient Progress   Stayed the entire time, engaged throughout.  "My biggest obstacle is getting a job.  Related to this are not having day care for kids, my background, lack of transportation, and no support from the father of my children."  Was open to feedback from others, some of which she found helpful.  Stated her hope comes from her children, and her aunt who basically raised her and is still involved as a support.   Therapeutic Modalities:   Cognitive Behavioral Therapy Solution Focused Therapy Motivational Interviewing Relapse Prevention Therapy  Ida RogueRodney B Jhostin Epps, LCSW 05/23/2018 3:24 PM

## 2018-05-23 NOTE — Plan of Care (Signed)
D: Pt denies SI/HI/AVH. Pt is pleasant and cooperative. Pt seen in dayroom watching TV with peers , pt presents less depressed this evening. Pt forwards little information but is cooperative.   A: Pt was offered support and encouragement. Pt was given scheduled medications. Pt was encourage to attend groups. Q 15 minute checks were done for safety.   R:Pt attends groups and interacts well with peers and staff. Pt is taking medication. Pt has no complaints.Pt receptive to treatment and safety maintained on unit.   Problem: Education: Goal: Emotional status will improve Outcome: Progressing   Problem: Education: Goal: Mental status will improve Outcome: Progressing   Problem: Activity: Goal: Sleeping patterns will improve Outcome: Progressing   Problem: Coping: Goal: Ability to verbalize frustrations and anger appropriately will improve Outcome: Progressing   Problem: Safety: Goal: Periods of time without injury will increase Outcome: Progressing

## 2018-05-24 MED ORDER — LIDOCAINE 5 % EX PTCH: 1.0000 | MEDICATED_PATCH | CUTANEOUS | 0 refills | Status: DC

## 2018-05-24 MED ORDER — SERTRALINE HCL 50 MG PO TABS: 50.0000 mg | ORAL_TABLET | Freq: Every day | ORAL | 0 refills | Status: DC

## 2018-05-24 MED ORDER — ARIPIPRAZOLE 20 MG PO TABS: 20.0000 mg | ORAL_TABLET | Freq: Every day | ORAL | 0 refills | Status: DC

## 2018-05-24 MED ORDER — HYDROXYZINE HCL 50 MG PO TABS: 50.0000 mg | ORAL_TABLET | Freq: Four times a day (QID) | ORAL | 0 refills | Status: DC | PRN

## 2018-05-24 MED ORDER — ARIPIPRAZOLE ER 400 MG IM SRER: 400.0000 mg | INTRAMUSCULAR | 0 refills | Status: DC

## 2018-05-24 MED ORDER — TRAZODONE HCL 100 MG PO TABS: 100.0000 mg | ORAL_TABLET | Freq: Every evening | ORAL | 0 refills | Status: DC | PRN

## 2018-05-24 NOTE — Progress Notes (Signed)
Recreation Therapy Notes  INPATIENT RECREATION TR PLAN  Patient Details Name: Anne Richardson MRN: [id number] DOB: 1986-11-03 Today's Date: 05/24/2018  Rec Therapy Plan Is patient appropriate for Therapeutic Recreation?: Yes Treatment times per week: about 3 days Estimated Length of Stay: 5-7 days TR Treatment/Interventions: Group participation (Comment)  Discharge Criteria Pt will be discharged from therapy if:: Discharged Treatment plan/goals/alternatives discussed and agreed upon by:: Patient/family  Discharge Summary Short term goals set: See patient care plan. Short term goals met: Adequate for discharge Progress toward goals comments: Groups attended Which groups?: Other (Comment)([facility]) Reason goals not met: Pt attended one group. Therapeutic equipment acquired: N/A Reason patient discharged from therapy: Discharge from hospital Pt/family agrees with progress & goals achieved: Yes Date patient discharged from therapy: 05/24/18    Victorino Sparrow, LRT/CTRS   Ria Comment, Keian Odriscoll A 05/24/2018, 12:05 PM

## 2018-05-24 NOTE — Progress Notes (Signed)
D:  Patient's self inventory sheet, patient has fair sleep, sleep medication helpful.  Fair appetite, normal energy level, poor concentration.  Rated depression and anxiety 4, hopeless 3.  Denied withdrawals.  Denied SI.  Physical problems, tooth, worst pain in past 24 hours is #5.  Pain medicine is helpful.  Goal is keep calm.  Plans no negative thinking.  No discharge plans. A:  Medications administered per MD orders.  Emotional support and encouragement given patient. R:  Denied SI and HI, contracts for safety.  Denied A/V hallucinations.  Safety maintained with 15 minute checks. Patient looking forward to discharge today.

## 2018-05-24 NOTE — Plan of Care (Signed)
Pt was able to express the first step she needed to take to deal with her depression.   Anne RancherMarjette Mahoganie Basher, LRT/CTRS

## 2018-05-24 NOTE — Tx Team (Signed)
Interdisciplinary Treatment and Diagnostic Plan Update  05/24/2018 Time of Session: 9:38 AM  Anne Richardson MRN: [id number]  Principal Diagnosis: Schizoaffective disorder, bipolar type [facility])  Secondary Diagnoses: Principal Problem:   Schizoaffective disorder, bipolar type ([place]) Active Problems:   PTSD (post-traumatic stress disorder)   Cannabis abuse   Current Medications:  Current Facility-Administered Medications  Medication Dose Route Frequency Provider Last Rate Last Dose  . acetaminophen (TYLENOL) tablet 650 mg  650 mg Oral Q6H PRN Dara Hoyer, PA-C   650 mg at 05/23/18 0802  . alum & mag hydroxide-simeth (MAALOX/MYLANTA) 200-200-20 MG/5ML suspension 30 mL  30 mL Oral Q4H PRN Pennelope Bracken, MD      . ARIPiprazole (ABILIFY) tablet 20 mg  20 mg Oral Daily Derrill Center, NP   20 mg at 05/24/18 0833  . ARIPiprazole ER (ABILIFY MAINTENA) injection 400 mg  400 mg Intramuscular Q28 days Lindell Spar I, NP   400 mg at 05/24/18 0927  . hydrOXYzine (ATARAX/VISTARIL) tablet 50 mg  50 mg Oral Q6H PRN Pennelope Bracken, MD   50 mg at 05/23/18 2223  . lidocaine (LIDODERM) 5 % 1 patch  1 patch Transdermal Q24H Derrill Center, NP   1 patch at 05/23/18 1127  . magnesium hydroxide (MILK OF MAGNESIA) suspension 30 mL  30 mL Oral Daily PRN Pennelope Bracken, MD      . ondansetron (ZOFRAN-ODT) disintegrating tablet 4 mg  4 mg Oral Q6H PRN Patriciaann Clan E, PA-C      . sertraline (ZOLOFT) tablet 50 mg  50 mg Oral Daily Pennelope Bracken, MD   50 mg at 05/24/18 7262  . traZODone (DESYREL) tablet 100 mg  100 mg Oral QHS PRN Lindon Romp A, NP   100 mg at 05/23/18 2223    PTA Medications: Medications Prior to Admission  Medication Sig Dispense Refill Last Dose  . cyclobenzaprine (FLEXERIL) 10 MG tablet Take 1 tablet (10 mg total) by mouth 3 (three) times daily as needed for muscle spasms. (Patient not taking: Reported on 05/19/2018) 20 tablet 0 Not Taking at  Unknown time  . dicloxacillin (DYNAPEN) 500 MG capsule Take 1 capsule (500 mg total) by mouth 3 (three) times daily. (Patient not taking: Reported on 05/19/2018) 21 capsule 0 Not Taking at Unknown time  . ibuprofen (ADVIL,MOTRIN) 600 MG tablet Take 1 tablet (600 mg total) by mouth every 6 (six) hours. (Patient not taking: Reported on 05/19/2018) 30 tablet 0 Not Taking at Unknown time  . oxyCODONE (OXY IR/ROXICODONE) 5 MG immediate release tablet Take 1 tablet (5 mg total) by mouth every 4 (four) hours as needed (pain scale 4-7). (Patient not taking: Reported on 05/19/2018) 10 tablet 0 Not Taking at Unknown time  . senna-docusate (SENOKOT-S) 8.6-50 MG tablet Take 1 tablet by mouth at bedtime as needed for mild constipation. (Patient not taking: Reported on 05/19/2018) 30 tablet 0 Not Taking at Unknown time    Patient Stressors: Financial difficulties Marital or family conflict Other: child care issues  Patient Strengths: Ability for insight Average or above average intelligence Capable of independent living General fund of knowledge  Treatment Modalities: Medication Management, Group therapy, Case management,  1 to 1 session with clinician, Psychoeducation, Recreational therapy.   Physician Treatment Plan for Primary Diagnosis: Schizoaffective disorder, bipolar type ([place]) Long Term Goal(s): Improvement in symptoms so as ready for discharge  Short Term Goals: Ability to identify and develop effective coping behaviors will improve Ability to identify triggers associated  with substance abuse/mental health issues will improve  Medication Management: Evaluate patient's response, side effects, and tolerance of medication regimen.  Therapeutic Interventions: 1 to 1 sessions, Unit Group sessions and Medication administration.  Evaluation of Outcomes: Adequate for Discharge  Physician Treatment Plan for Secondary Diagnosis: Principal Problem:   Schizoaffective disorder, bipolar type ([place]) Active  Problems:   PTSD (post-traumatic stress disorder)   Cannabis abuse   Long Term Goal(s): Improvement in symptoms so as ready for discharge  Short Term Goals: Ability to identify and develop effective coping behaviors will improve Ability to identify triggers associated with substance abuse/mental health issues will improve  Medication Management: Evaluate patient's response, side effects, and tolerance of medication regimen.  Therapeutic Interventions: 1 to 1 sessions, Unit Group sessions and Medication administration.  Evaluation of Outcomes: Adequate for Discharge   RN Treatment Plan for Primary Diagnosis: Schizoaffective disorder, bipolar type ([place]) Long Term Goal(s): Knowledge of disease and therapeutic regimen to maintain health will improve  Short Term Goals: Ability to verbalize feelings will improve, Ability to disclose and discuss suicidal ideas and Compliance with prescribed medications will improve  Medication Management: RN will administer medications as ordered by provider, will assess and evaluate patient's response and provide education to patient for prescribed medication. RN will report any adverse and/or side effects to prescribing provider.  Therapeutic Interventions: 1 on 1 counseling sessions, Psychoeducation, Medication administration, Evaluate responses to treatment, Monitor vital signs and CBGs as ordered, Perform/monitor CIWA, COWS, AIMS and Fall Risk screenings as ordered, Perform wound care treatments as ordered.  Evaluation of Outcomes: Adequate for Discharge   LCSW Treatment Plan for Primary Diagnosis: Schizoaffective disorder, bipolar type ([place]) Long Term Goal(s): Safe transition to appropriate next level of care at discharge, Engage patient in therapeutic group addressing interpersonal concerns.  Short Term Goals: Engage patient in aftercare planning with referrals and resources  Therapeutic Interventions: Assess for all discharge needs, 1 to 1 time  with Social worker, Explore available resources and support systems, Assess for adequacy in community support network, Educate family and significant other(s) on suicide prevention, Complete Psychosocial Assessment, Interpersonal group therapy.  Evaluation of Outcomes: Met  Return home, follow up [facility] with the support of TCT   Progress in Treatment: Attending groups: Yes Participating in groups: Yes Taking medication as prescribed: Yes Toleration medication: Yes, no side effects reported at this time Family/Significant other contact made: Yes Patient understands diagnosis: Yes AEB asking for help with depressive symptoms Discussing patient identified problems/goals with staff: Yes Medical problems stabilized or resolved: Yes Denies suicidal/homicidal ideation: Yes Issues/concerns per patient self-inventory: None Other: N/A  New problem(s) identified: None identified at this time.   New Short Term/Long Term Goal(s):"I need help with crying all the time, and feeling helpless and hopeless."  Discharge Plan or Barriers:   Reason for Continuation of Hospitalization:   Estimated Length of Stay:D/C today  Attendees: Patient:  05/24/2018  9:38 AM  Physician: Maris Berger, MD 05/24/2018  9:38 AM  Nursing: Sena Hitch, RN 05/24/2018  9:38 AM  RN Care Manager: Lars Pinks, RN 05/24/2018  9:38 AM  Social Worker: Ripley Fraise 05/24/2018  9:38 AM  Recreational Therapist: Winfield Cunas 05/24/2018  9:38 AM  Other: Norberto Sorenson 05/24/2018  9:38 AM  Other:  05/24/2018  9:38 AM    Scribe for Treatment Team:  Roque Lias LCSW 05/24/2018 9:38 AM

## 2018-05-24 NOTE — Progress Notes (Signed)
Discharge Note:  Patient discharged with follow up.  Patient denied SI and HI.  Denied pain.  Suicide prevention information given and discussed with patient who stated she understood and had no questions.  Patient stated she received all her belongings, clothing, toiletries, misc items, prescriptions, etc.  Patient stated she appreciated all assistance received from [facility].  All required discharge information given to patient at discharge.

## 2018-05-24 NOTE — [facility] Suicide Risk Assessment (Signed)
[facility]   Principal Problem: Schizoaffective disorder, bipolar type [facility](HCC) Discharge Diagnoses:  Patient Active Problem List   Diagnosis Date Noted  . Schizoaffective disorder, bipolar type (HCC) [F25.0] 05/20/2018  . PTSD (post-traumatic stress disorder) [F43.10] 05/20/2018  . Cannabis abuse [F12.10] 05/20/2018  . Status post repeat low transverse cesarean section [Z98.891] 07/24/2017  . Insufficient prenatal care in third trimester [O09.33] 07/20/2017  . Supervision of other normal pregnancy, antepartum [Z34.80] 07/20/2017  . Previous cesarean section [Z98.891] 03/21/2016    Total Time spent with patient: 30 minutes  Musculoskeletal: Strength & Muscle Tone: within normal limits Gait & Station: normal Patient leans: N/A  Psychiatric Specialty Exam: Review of Systems  Constitutional: Negative for chills and fever.  Respiratory: Negative for cough and shortness of breath.   Cardiovascular: Negative for chest pain.  Gastrointestinal: Negative for abdominal pain, heartburn, nausea and vomiting.  Psychiatric/Behavioral: Negative for depression, hallucinations and suicidal ideas. The patient is not nervous/anxious and does not have insomnia.     Blood pressure 104/82, pulse 91, temperature 98.2 F (36.8 C), temperature source Oral, resp. rate 18, height 5\' 5"  (1.651 m), weight 98.9 kg (218 lb), unknown if currently breastfeeding.Body mass index is 36.28 kg/m.  General Appearance: Casual and Fairly Groomed  Patent attorneyye Contact::  Good  Speech:  Clear and Coherent and Normal Rate  Volume:  Normal  Mood:  Euthymic  Affect:  Appropriate and Congruent  Thought Process:  Coherent and Goal Directed  Orientation:  Full (Time, Place, and Person)  Thought Content:  Logical  Suicidal Thoughts:  No  Homicidal Thoughts:  No  Memory:  Immediate;   Fair Recent;   Fair Remote;   Fair  Judgement:  Fair  Insight:  Fair  Psychomotor Activity:  Normal  Concentration:   Fair  Recall:  FiservFair  Fund of Knowledge:Fair  Language: Fair  Akathisia:  No  Handed:    AIMS (if indicated):     Assets:  Resilience Social Support  Sleep:  Number of Hours: 6.75  Cognition: WNL  ADL's:  Intact   Mental Status Per Nursing Assessment::   On Admission:  Suicidal ideation indicated by patient, Self-harm thoughts  Demographic Factors:  Low socioeconomic status and Unemployed  Loss Factors: Financial problems/change in socioeconomic status  Historical Factors: Impulsivity  Risk Reduction Factors:   Positive social support, Positive therapeutic relationship and Positive coping skills or problem solving skills  Continued Clinical Symptoms:  Bipolar Disorder:   Depressive phase Schizophrenia:   Paranoid or undifferentiated type  Cognitive Features That Contribute To Risk:  None    Suicide Risk:  Minimal: No identifiable suicidal ideation.  Patients presenting with no risk factors but with morbid ruminations; may be classified as minimal risk based on the severity of the depressive symptoms  Follow-up Information    [facility] Follow up on 05/27/2018.   Specialty:  Behavioral Health Why:  Hospital follow-up on Friday 6/7at 8:00AM. Please bring medicaid card and photo ID to this appt. Furthermore, you are signed up for transitional care team services. Contact Valerie at [phone] for help with transportation and other assistance  Contact information: [address] [place] [address] [phone]        [facility].   Specialty:  Internal Medicine Why:  Please call office after June 17th when they can begin taking new patients.  Contact information: [address] [place] [id number] [phone]  Subjective Data:  Anne Richardson is a 32 y/o F with no formal psychiatric diagnoses who presented voluntarily from [facility] where she presented brought in by [facility] from [facility] where  she has been staying due to anxiety, agitation, and disorganized behaviors as well as being under the influence of cannabis. In ED she reported depression and SI regarding her social situation, and she was medically cleared and then transferred to [facility] for additional treatment and evaluation. Pt was started on trial of abilify and zoloft, and she was eventually transitioned to long-acting injectable form of abilify. She had improvement of her presenting symptoms during her admission.  Today upon evaluation, pt shares, "I'm doing good, do I get to go home today?" Pt denies any specific concerns. She is sleeping well. Her appetite is good. She denies physical complaints. She denies SI/HI/AH/VH. She is tolerating her medications well, and she is in agreement to continue her current regimen without changes. She plans to have outpatient follow up at [facility].  She was able to engage in safety planning including plan to return to [facility] or contact emergency services if she feels unable to maintain her own safety or the safety of others. Pt had no further questions, comments, or concerns.   Plan Of Care/Follow-up recommendations:   - Discharge to outpatient level of care  -Schizoaffective Disorder, bipolar type      - Continue  Abilify 15 mg QD  to 20 mg      -Continue Abilify maintena 400 mg IM q28 days (administered 05/23/18)      - Continue Zoloft 50 mg p.o. Daily   -Insomnia:     -Continue with Trazodone 50 mg po qhs prn insomnia  Activity:  as tolerated Diet:  normal Tests:  NA Other:  see above for DC plan  Micheal Likens, MD 05/24/2018, 9:55 AM

## 2018-05-24 NOTE — Discharge Summary (Addendum)
Physician Discharge Summary Note  Patient:  Anne Richardson is an 32 y.o., female  MRN:  [id number]  DOB:  March 24, 1986  Patient phone:  [phone] (home)   Patient address:   [address] [place] [address],   Total Time spent with patient: Greater than 30 minutes  Date of Admission:  05/19/2018 Date of Discharge: 05-24-18  Reason for Admission: Worsening anxiety, agitation & disorganized behaviors as well as being under the influence of cannabis.  Principal Problem: Schizoaffective disorder, bipolar type [facility])  Discharge Diagnoses: Patient Active Problem List   Diagnosis Date Noted  . Schizoaffective disorder, bipolar type (HCC) [F25.0] 05/20/2018  . PTSD (post-traumatic stress disorder) [F43.10] 05/20/2018  . Cannabis abuse [F12.10] 05/20/2018  . Status post repeat low transverse cesarean section [Z98.891] 07/24/2017  . Insufficient prenatal care in third trimester [O09.33] 07/20/2017  . Supervision of other normal pregnancy, antepartum [Z34.80] 07/20/2017  . Previous cesarean section [Z98.891] 03/21/2016   Past Psychiatric History: Schizoaffective disorder  Past Medical History:  Past Medical History:  Diagnosis Date  . Pregnancy induced hypertension     Past Surgical History:  Procedure Laterality Date  . BIOPSY BREAST    . BREAST LUMPECTOMY     left  . CESAREAN SECTION    . CESAREAN SECTION  06/28/2012   Procedure: CESAREAN SECTION;  Surgeon: Lesly Dukes, MD;  Location: [facility] ORS;  Service: Gynecology;  Laterality: N/A;  . CESAREAN SECTION N/A 03/21/2016   Procedure: CESAREAN SECTION;  Surgeon: Lazaro Arms, MD;  Location: [facility] ORS;  Service: Obstetrics;  Laterality: N/A;  . CESAREAN SECTION N/A 07/24/2017   Procedure: CESAREAN SECTION;  Surgeon: [name], MD;  Location: [facility]ING SUITES;  Service: Obstetrics;  Laterality: N/A;  . WISDOM TOOTH EXTRACTION     age 67   Family History:  Family History  Problem Relation Age of Onset  . Cancer  Mother 24       breast  . Alcohol abuse Mother   . Depression Mother   . Hypertension Mother   . Depression Sister   . Hypertension Sister   . Asthma Brother   . Depression Brother   . Hypertension Brother   . Alcohol abuse Maternal Aunt   . Hypertension Maternal Aunt   . Cancer Maternal Aunt 65       breast ca  . Alcohol abuse Maternal Uncle   . Hypertension Maternal Uncle   . Heart disease Maternal Grandmother   . Alcohol abuse Maternal Grandmother   . Hypertension Maternal Grandmother   . Heart disease Maternal Grandfather   . Alcohol abuse Maternal Grandfather   . Hypertension Maternal Grandfather   . Cancer Maternal Grandfather        prostate and bone  . Depression Paternal Grandfather   . Alzheimer's disease Paternal Grandmother   . Diabetes Paternal Grandmother    Family Psychiatric  History: See H&P  Social History:  Social History   Substance and Sexual Activity  Alcohol Use No     Social History   Substance and Sexual Activity  Drug Use Yes  . Types: Marijuana   Comment: Last used at 17:00 today     Social History   Socioeconomic History  . Marital status: Single    Spouse name: Not on file  . Number of children: Not on file  . Years of education: Not on file  . Highest education level: Not on file  Occupational History  . Not on file  Social Needs  . Financial resource strain: Not on file  . Food insecurity:    Worry: Not on file    Inability: Not on file  . Transportation needs:    Medical: Not on file    Non-medical: Not on file  Tobacco Use  . Smoking status: Never Smoker  . Smokeless tobacco: Never Used  Substance and Sexual Activity  . Alcohol use: No  . Drug use: Yes    Types: Marijuana    Comment: Last used at 17:00 today   . Sexual activity: Not Currently    Birth control/protection: None  Lifestyle  . Physical activity:    Days per week: Not on file    Minutes per session: Not on file  . Stress: Not on file   Relationships  . Social connections:    Talks on phone: Not on file    Gets together: Not on file    Attends religious service: Not on file    Active member of club or organization: Not on file    Attends meetings of clubs or organizations: Not on file    Relationship status: Not on file  Other Topics Concern  . 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 In ED she reported depression and SI regarding her social situation, and she was medically cleared and [facility] and [facility]. Pt was started on trial of abilify and zoloft, and she was eventually transitioned to long-acting injectable form of abilify. She had improvement of her presenting symptoms during her admission.  Besides the use of Sertraline 50 mg for depression, Abilify Maintenna 400 mg IM Q 28 days, Abilify 20 mg po for mood control, Aliani was also medicated & discharged on; Hydroxyzine 50 mg prn for anxiety & Trazodone 100 mg prn for insomnia. She was enrolled & participated in the group counseling sessions being offered & held on this unit. She learned coping skills. She presented other significant medical issues that required treatment or monitoring. She received medication management for those health issues. She tolerated her treatment regimen without any adverse effects or reactions reported.  Besides the use of Lexapro 10 mg for depression, Anne Richardson was also medicated & discharged on; Benadryl 25 mg for  insomnia, Nicotine patch 21 mg for smoking cessation & Trazodone 50 mg prn for insomnia. She was enrolled & participated in the group counseling sessions being offered & held on this unit. She learned coping skills. Anne Richardson presented no other significant medical issues that required treatment or monitoring. She tolerated her treatment regimen without any adverse effects or reactions reported.  Today upon evaluation, pt shares, "I'm doing good, do I get to go home today?" Pt denies any specific concerns. She is sleeping well. Her appetite is good. She denies physical complaints. She denies SI/HI/AH/VH. She is tolerating her medications well, and she is in agreement to continue her current regimen without changes. [facility]  She was able to engage in safety  planning including plan to return to [facility] or contact emergency services if she feels unable to maintain her own safety or the safety of others. Pt had no further questions, comments, or concerns.  Upon discharge, Timica presents mentally & medically stable. She will continue further mental health care & medication management on an outpatient basis as noted below. She is provided with all the necessary information needed to make this appointment without problems. She left [facility] with all personal belongings in no apparent distress. Transportation per the city bus. [facility] assisted with the bus pass.  Physical Findings: AIMS: Facial and Oral Movements Muscles of Facial Expression: None, normal Lips and Perioral Area: None, normal Jaw: None, normal Tongue: None, normal,Extremity Movements Upper (arms, wrists, hands, fingers): None, normal Lower (legs, knees, ankles, toes): None, normal, Trunk Movements Neck, shoulders, hips: None, normal, Overall Severity Severity of abnormal movements (highest score from questions above): None, normal Incapacitation due to abnormal movements: None, normal Patient's awareness of  abnormal movements (rate only patient's report): No Awareness, Dental Status Current problems with teeth and/or dentures?: No Does patient usually wear dentures?: No  CIWA:  CIWA-Ar Total: 1 COWS:  COWS Total Score: 1  Musculoskeletal: Strength & Muscle Tone: within normal limits Gait & Station: normal Patient leans: N/A  Psychiatric Specialty Exam: Physical Exam  Constitutional: She appears well-developed.  HENT:  Head: Normocephalic.  Eyes: Pupils are equal, round, and reactive to light.  Neck: Normal range of motion.  Cardiovascular: Normal rate.  Respiratory: Effort normal.  GI: Soft.  Genitourinary:  Genitourinary Comments: Deferred  Musculoskeletal: Normal range of motion.  Neurological: She is alert.  Skin: Skin is warm.    Review of Systems  Constitutional: Negative.   HENT: Negative.   Eyes: Negative.   Respiratory: Negative.   Cardiovascular: Negative.   Gastrointestinal: Negative.   Genitourinary: Negative.   Musculoskeletal: Negative.   Skin: Negative.   Neurological: Negative.   Endo/Heme/Allergies: Negative.   Psychiatric/Behavioral: Positive for depression (Stabilized with medication prior to discharge), hallucinations (Hx. Psychosis) and substance abuse (Hx. THC use disorder). Negative for memory loss and suicidal ideas. The patient has insomnia (Stabilized with medication prior to discharge). The patient is not nervous/anxious.     Blood pressure 104/82, pulse 91, temperature 98.2 F (36.8 C), temperature source Oral, resp. rate 18, height 5\' 5"  (1.651 m), weight 98.9 kg (218 lb), unknown if currently breastfeeding.Body mass index is 36.28 kg/m.  See Md's SRA   Have you used any form of tobacco in the last 30 days? (Cigarettes, Smokeless Tobacco, Cigars, and/or Pipes): No  Has this patient used any form of tobacco in the last 30 days? (Cigarettes, Smokeless Tobacco, Cigars, and/or Pipes): N/A  Blood Alcohol level:  Lab Results  Component Value Date    ETH <10 05/19/2018   Metabolic Disorder Labs:  Lab Results  Component Value Date   HGBA1C 5.6 05/21/2018   MPG 114.02 05/21/2018   Lab Results  Component Value Date   PROLACTIN 23.4 (H) 05/21/2018   Lab Results  Component Value Date   CHOL 226 (H) 05/21/2018   TRIG 98 05/21/2018   HDL 49 05/21/2018   CHOLHDL 4.6 05/21/2018   VLDL 20 05/21/2018   LDLCALC 157 (H) 05/21/2018   See Psychiatric Specialty Exam and Suicide Risk Assessment completed by Attending Physician prior to discharge.  Discharge destination:  Home  Is patient on multiple antipsychotic therapies at discharge:  No   Has Patient had three or more failed trials of antipsychotic  monotherapy by history:  No  Recommended Plan for Multiple Antipsychotic Therapies: NA  Allergies as of 05/24/2018      Reactions   Penicillins Itching, Swelling   Has patient had a PCN reaction causing immediate rash, facial/tongue/throat swelling, SOB or lightheadedness with hypotension: No Has patient had a PCN reaction causing severe rash involving mucus membranes or skin necrosis: No Has patient had a PCN reaction that required hospitalization No Has patient had a PCN reaction occurring within the last 10 years: No If all of the above answers are "NO", then may proceed with Cephalosporin use.      Medication List    STOP taking these medications   cyclobenzaprine 10 MG tablet Commonly known as:  FLEXERIL   dicloxacillin 500 MG capsule Commonly known as:  DYNAPEN   ibuprofen 600 MG tablet Commonly known as:  ADVIL,MOTRIN   oxyCODONE 5 MG immediate release tablet Commonly known as:  Oxy IR/ROXICODONE   senna-docusate 8.6-50 MG tablet Commonly known as:  Senokot-S     TAKE these medications     Indication  ARIPiprazole 20 MG tablet Commonly known as:  ABILIFY Take 1 tablet (20 mg total) by mouth daily. For mood control Start taking on:  05/25/2018  Indication:  Mood control   ARIPiprazole ER 400 MG Srer  injection Commonly known as:  ABILIFY MAINTENA Inject 2 mLs (400 mg total) into the muscle every 28 (twenty-eight) days. (Due on 06-21-18): For mood control Start taking on:  06/21/2018  Indication:  Mood control   hydrOXYzine 50 MG tablet Commonly known as:  ATARAX/VISTARIL Take 1 tablet (50 mg total) by mouth every 6 (six) hours as needed for anxiety.  Indication:  Feeling Anxious   lidocaine 5 % Commonly known as:  LIDODERM Place 1 patch onto the skin daily. Remove & Discard patch within 12 hours or as directed by MD: For pain management  Indication:  Pain management   sertraline 50 MG tablet Commonly known as:  ZOLOFT Take 1 tablet (50 mg total) by mouth daily. For depression Start taking on:  05/25/2018  Indication:  Major Depressive Disorder   traZODone 100 MG tablet Commonly known as:  DESYREL Take 1 tablet (100 mg total) by mouth at bedtime as needed for sleep.  Indication:  Trouble Sleeping      Follow-up Information    [facility] Follow up on 05/27/2018.   Specialty:  Behavioral Health Why:  Hospital follow-up on Friday 6/7at 8:00AM. Please bring medicaid card and photo ID to this appt. Furthermore, you are signed up for transitional care team services. Contact Valerie at [phone] for help with transportation and other assistance  Contact information: [address] [place] [address] [phone]        [facility] Follow up.   Specialty:  Internal Medicine Why:  Please call office after June 17th when they can begin taking new patients.  Contact information: [address] [place] [address] [phone]         Follow-up recommendations: Activity:  As tolerated Diet: As recommended by your primary care doctor. Keep all scheduled follow-up appointments as recommended.    Comments: Patient is instructed prior to discharge to: Take all medications as prescribed by his/her mental healthcare provider. Report any  adverse effects and or reactions from the medicines to his/her outpatient provider promptly. Patient has been instructed & cautioned: To not engage in alcohol and or illegal drug use while on prescription medicines.  In the event of worsening symptoms, patient is instructed to call the crisis hotline, 911 and or go to the nearest ED for appropriate evaluation and treatment of symptoms. To follow-up with his/her primary care provider for your other medical issues, concerns and or health care needs.   Signed: Armandina Stammer, NP, PMHNP, FNP-BC 05/24/2018, 10:16 AM   Patient seen, Suicide Assessment Completed.  Disposition Plan Reviewed

## 2018-05-24 NOTE — Progress Notes (Signed)
Recreation Therapy Notes  Date: 6.4.19 Time: 1000 Location: 500 Hall  Group Topic: Communication, Team Building, Problem Solving  Goal Area(s) Addresses:  Patient will effectively work with peer towards shared goal.  Patient will identify skill used to make activity successful.  Patient will identify how skills used during activity can be used to reach post d/c goals.   Behavioral Response: Engaged  Intervention: Trust Activity  Activity:  Patients were given rubber discs (one more than the number of people doing the activity).  Patients were to use the rubber discs to maneuver from one end of the hall to the other and back to the starting point.  Patients were to remain on their disc at all times, if anyone stepped off, the group would have to start over from the beginning.   Education: Pharmacist, communityocial Skills, Building control surveyorDischarge Planning.   Education Outcome: Acknowledges education/In group clarification offered/Needs additional education.   Clinical Observations/Feedback: Pt was engaged and bright during activity.  Pt worked well with peers.  Pt stated the group used teamwork and communication to complete activity.  Pt also stated before she could use these skills with a support system, she had to "build up a group of people I can trust".    Caroll RancherMarjette Minor Iden, LRT/CTRS      Lillia AbedLindsay, Devin Foskey A 05/24/2018 11:20 AM

## 2018-05-24 NOTE — Progress Notes (Signed)
  [facility] Dis[facility] :  Will you be returning to the same living situation after discharge:  Yes,  home At discharge, do you have transportation home?: Yes,  bus pass Do you have the ability to pay for your medications: Yes,  MCD  Release of information consent forms completed and in the chart;  Patient's signature needed at discharge.  Patient to Follow up at: Follow-up Information    [facility] Follow up on 05/27/2018.   Specialty:  Behavioral Health Why:  Hospital follow-up on Friday 6/7at 8:00AM. Please bring medicaid card and photo ID to this appt. Furthermore, you are signed up for transitional care team services. Contact Valerie at [phone] for help with transportation and other assistance  Contact information: [address] [place] [address] [phone]        [facility].   Specialty:  Internal Medicine Why:  Please call office after June 17th when they can begin taking new patients.  Contact information: [address] [place] [phone]          Next level of care provider has access to [facility] Link:no  Safety Planning and Suicide Prevention discussed: Yes,  yes  Have you used any form of tobacco in the last 30 days? (Cigarettes, Smokeless Tobacco, Cigars, and/or Pipes): No  Has patient been referred to the Quitline?: N/A patient is not a smoker  Patient has been referred for addiction treatment: N/A  Ida RogueRodney B Elaisha Zahniser, LCSW 05/24/2018, 9:55 AM

## 2018-12-16 ENCOUNTER — Inpatient Hospital Stay (HOSPITAL_COMMUNITY): Payer: Medicaid Other

## 2018-12-16 ENCOUNTER — Inpatient Hospital Stay (HOSPITAL_COMMUNITY)
Admission: AD | Admit: 2018-12-16 | Discharge: 2018-12-16 | Disposition: A | Discharge status: Home / Self Care | Payer: Medicaid Other | Attending: Obstetrics & Gynecology | Admitting: Obstetrics & Gynecology

## 2018-12-16 ENCOUNTER — Encounter (HOSPITAL_COMMUNITY): Payer: Self-pay | Admitting: *Deleted

## 2018-12-16 DIAGNOSIS — Z3A01 Less than 8 weeks gestation of pregnancy: Secondary | ICD-10-CM | POA: Insufficient documentation

## 2018-12-16 DIAGNOSIS — O26891 Other specified pregnancy related conditions, first trimester: Secondary | ICD-10-CM

## 2018-12-16 DIAGNOSIS — O26899 Other specified pregnancy related conditions, unspecified trimester: Secondary | ICD-10-CM

## 2018-12-16 DIAGNOSIS — F1210 Cannabis abuse, uncomplicated: Secondary | ICD-10-CM

## 2018-12-16 DIAGNOSIS — Z880 Allergy status to penicillin: Secondary | ICD-10-CM | POA: Diagnosis not present

## 2018-12-16 DIAGNOSIS — Z3201 Encounter for pregnancy test, result positive: Secondary | ICD-10-CM

## 2018-12-16 DIAGNOSIS — R109 Unspecified abdominal pain: Secondary | ICD-10-CM | POA: Insufficient documentation

## 2018-12-16 HISTORY — DX: Gestational diabetes mellitus in pregnancy, unspecified control: O24.419

## 2018-12-16 HISTORY — DX: Supervision of pregnancy with insufficient antenatal care, third trimester: O09.33

## 2018-12-16 HISTORY — DX: Depression, unspecified: F32.A

## 2018-12-16 HISTORY — DX: Gonococcal infection, unspecified: A54.9

## 2018-12-16 HISTORY — DX: Chlamydial infection, unspecified: A74.9

## 2018-12-16 HISTORY — DX: Anxiety disorder, unspecified: F41.9

## 2018-12-16 HISTORY — DX: Major depressive disorder, single episode, unspecified: F32.9

## 2018-12-16 LAB — CBC WITH DIFFERENTIAL/PLATELET
Basophils Absolute: 0 10*3/uL (ref 0.0–0.1)
Basophils Relative: 0 %
EOS ABS: 0.1 10*3/uL (ref 0.0–0.5)
EOS PCT: 1 %
HCT: 39.3 % (ref 36.0–46.0)
Hemoglobin: 13 g/dL (ref 12.0–15.0)
LYMPHS ABS: 2.2 10*3/uL (ref 0.7–4.0)
LYMPHS PCT: 25 %
MCH: 29.3 pg (ref 26.0–34.0)
MCHC: 33.1 g/dL (ref 30.0–36.0)
MCV: 88.7 fL (ref 80.0–100.0)
MONOS PCT: 5 %
Monocytes Absolute: 0.4 10*3/uL (ref 0.1–1.0)
NEUTROS PCT: 69 %
Neutro Abs: 5.9 10*3/uL (ref 1.7–7.7)
Platelets: 334 10*3/uL (ref 150–400)
RBC: 4.43 MIL/uL (ref 3.87–5.11)
RDW: 14.5 % (ref 11.5–15.5)
WBC: 8.6 10*3/uL (ref 4.0–10.5)
nRBC: 0 % (ref 0.0–0.2)

## 2018-12-16 LAB — URINALYSIS, ROUTINE W REFLEX MICROSCOPIC
Bilirubin Urine: NEGATIVE
Glucose, UA: NEGATIVE mg/dL
Hgb urine dipstick: NEGATIVE
Ketones, ur: NEGATIVE mg/dL
LEUKOCYTES UA: NEGATIVE
NITRITE: NEGATIVE
PH: 5 (ref 5.0–8.0)
Protein, ur: NEGATIVE mg/dL
SPECIFIC GRAVITY, URINE: 1.024 (ref 1.005–1.030)

## 2018-12-16 LAB — WET PREP, GENITAL
Clue Cells Wet Prep HPF POC: NONE SEEN
Sperm: NONE SEEN
Trich, Wet Prep: NONE SEEN
Yeast Wet Prep HPF POC: NONE SEEN

## 2018-12-16 LAB — POCT PREGNANCY, URINE: Preg Test, Ur: POSITIVE — AB

## 2018-12-16 LAB — RAPID URINE DRUG SCREEN, HOSP PERFORMED
Amphetamines: NOT DETECTED
BARBITURATES: NOT DETECTED
BENZODIAZEPINES: NOT DETECTED
COCAINE: NOT DETECTED
OPIATES: NOT DETECTED
TETRAHYDROCANNABINOL: POSITIVE — AB

## 2018-12-16 LAB — HCG, QUANTITATIVE, PREGNANCY: HCG, BETA CHAIN, QUANT, S: 7356 m[IU]/mL — AB (ref <5)

## 2018-12-16 NOTE — MAU Provider Note (Signed)
History     CSN: [id number]  Arrival date and time: 12/16/18 1112   First Provider Initiated Contact with Patient 12/16/18 1148      Chief Complaint  Patient presents with  . Back Pain  . Abdominal Pain  . Nausea   HPI Anne Richardson is a 32 y.o. [id number] at [redacted]w[redacted]d by LMP who presents to MAU with chief complaint of abdominal cramping. This is a new problem, onset 2-3 days ago. Pain "feels like its in my private parts and my stomach". Pain is 7-10/10, does not radiate, no aggravating or alleviating factors. Patient has not taken medication or tried other treatments.  OB History    Gravida  7   Para  4   Term  3   Preterm  1   AB  2   Living  5     SAB  2   TAB  0   Ectopic      Multiple  1   Live Births  5           Past Medical History:  Diagnosis Date  . Anxiety   . Chlamydia   . Depression    doing good now  . Gestational diabetes   . Gonorrhea   . Insufficient prenatal care in third trimester 07/20/2017   Onset of care at 38w  . Pregnancy induced hypertension     Past Surgical History:  Procedure Laterality Date  . BIOPSY BREAST    . BREAST LUMPECTOMY     left  . CESAREAN SECTION    . CESAREAN SECTION  06/28/2012   Procedure: CESAREAN SECTION;  Surgeon: Lesly Dukes, MD;  Location: [facility];  Service: Gynecology;  Laterality: N/A;  . CESAREAN SECTION N/A 03/21/2016   Procedure: CESAREAN SECTION;  Surgeon: Lazaro Arms, MD;  Location: [facility];  Service: Obstetrics;  Laterality: N/A;  . CESAREAN SECTION N/A 07/24/2017   Procedure: CESAREAN SECTION;  Surgeon: [name], MD;  Location: [facility] BIRTHING SU[facility];  Service: Obstetrics;  Laterality: N/A;  . WISDOM TOOTH EXTRACTION     age 13    Family History  Problem Relation Age of Onset  . Cancer Mother 78       breast  . Alcohol abuse Mother   . Depression Mother   . Hypertension Mother   . Depression Sister   . Hypertension Sister   . Asthma Brother   . Depression Brother   .  Hypertension Brother   . Alcohol abuse Maternal Aunt   . Hypertension Maternal Aunt   . Cancer Maternal Aunt 65       breast ca  . Alcohol abuse Maternal Uncle   . Hypertension Maternal Uncle   . Heart disease Maternal Grandmother   . Alcohol abuse Maternal Grandmother   . Hypertension Maternal Grandmother   . Heart disease Maternal Grandfather   . Alcohol abuse Maternal Grandfather   . Hypertension Maternal Grandfather   . Cancer Maternal Grandfather        prostate and bone  . Depression Paternal Grandfather   . Alzheimer's disease Paternal Grandmother   . Diabetes Paternal Grandmother     Social History   Tobacco Use  . Smoking status: Never Smoker  . Smokeless tobacco: Never Used  Substance Use Topics  . Alcohol use: No  . Drug use: Not Currently    Types: Marijuana    Allergies:  Allergies  Allergen Reactions  . Penicillins Itching and Swelling  Has patient had a PCN reaction causing immediate rash, facial/tongue/throat swelling, SOB or lightheadedness with hypotension: No Has patient had a PCN reaction causing severe rash involving mucus membranes or skin necrosis: No Has patient had a PCN reaction that required hospitalization No Has patient had a PCN reaction occurring within the last 10 years: No If all of the above answers are "NO", then may proceed with Cephalosporin use.     Medications Prior to Admission  Medication Sig Dispense Refill Last Dose  . ARIPiprazole (ABILIFY) 20 MG tablet Take 1 tablet (20 mg total) by mouth daily. For mood control 15 tablet 0   . ARIPiprazole ER (ABILIFY MAINTENA) 400 MG SRER injection Inject 2 mLs (400 mg total) into the muscle every 28 (twenty-eight) days. (Due on 06-21-18): For mood control 1 each 0   . hydrOXYzine (ATARAX/VISTARIL) 50 MG tablet Take 1 tablet (50 mg total) by mouth every 6 (six) hours as needed for anxiety. 60 tablet 0   . lidocaine (LIDODERM) 5 % Place 1 patch onto the skin daily. Remove & Discard patch  within 12 hours or as directed by MD: For pain management 30 patch 0   . sertraline (ZOLOFT) 50 MG tablet Take 1 tablet (50 mg total) by mouth daily. For depression 30 tablet 0   . traZODone (DESYREL) 100 MG tablet Take 1 tablet (100 mg total) by mouth at bedtime as needed for sleep. 30 tablet 0     Review of Systems  Constitutional: Negative for chills, fatigue and fever.  Gastrointestinal: Positive for abdominal pain. Negative for nausea and vomiting.  Genitourinary: Negative for difficulty urinating, dyspareunia, dysuria, flank pain, vaginal bleeding, vaginal discharge and vaginal pain.  Neurological: Negative for headaches.  All other systems reviewed and are negative.  Physical Exam   Blood pressure 134/78, pulse 73, temperature 98.4 F (36.9 C), temperature source Oral, resp. rate 16, height 5\' 6"  (1.676 m), weight 117.9 kg, last menstrual period 11/02/2018, SpO2 98 %, unknown if currently breastfeeding.  Physical Exam  Nursing note and vitals reviewed. Constitutional: She is oriented to person, place, and time. She appears well-developed and well-nourished.  Respiratory: Effort normal. No respiratory distress.  GI: Soft. Bowel sounds are normal. She exhibits no distension. There is no abdominal tenderness. There is no rebound and no guarding.  Neurological: She is alert and oriented to person, place, and time.  Skin: Skin is warm and dry.  Psychiatric: She has a normal mood and affect. Her behavior is normal. Judgment and thought content normal.    MAU Course/MDM   Patient Vitals for the past 24 hrs:  BP Temp Temp src Pulse Resp SpO2 Height Weight  12/16/18 1342 122/61 - - 76 16 - - -  12/16/18 1124 134/78 98.4 F (36.9 C) Oral 73 16 98 % 5\' 6"  (1.676 m) 117.9 kg    Results for orders placed or performed during the hospital encounter of 12/16/18 (from the past 24 hour(s))  Urinalysis, Routine w reflex microscopic     Status: None   Collection Time: 12/16/18 11:30 AM   Result Value Ref Range   Color, Urine YELLOW YELLOW   APPearance CLEAR CLEAR   Specific Gravity, Urine 1.024 1.005 - 1.030   pH 5.0 5.0 - 8.0   Glucose, UA NEGATIVE NEGATIVE mg/dL   Hgb urine dipstick NEGATIVE NEGATIVE   Bilirubin Urine NEGATIVE NEGATIVE   Ketones, ur NEGATIVE NEGATIVE mg/dL   Protein, ur NEGATIVE NEGATIVE mg/dL   Nitrite NEGATIVE NEGATIVE  Leukocytes, UA NEGATIVE NEGATIVE  Rapid urine drug screen (hospital performed)     Status: Abnormal   Collection Time: 12/16/18 11:30 AM  Result Value Ref Range   Opiates NONE DETECTED NONE DETECTED   Cocaine NONE DETECTED NONE DETECTED   Benzodiazepines NONE DETECTED NONE DETECTED   Amphetamines NONE DETECTED NONE DETECTED   Tetrahydrocannabinol POSITIVE (A) NONE DETECTED   Barbiturates NONE DETECTED NONE DETECTED  Pregnancy, urine POC     Status: Abnormal   Collection Time: 12/16/18 11:35 AM  Result Value Ref Range   Preg Test, Ur POSITIVE (A) NEGATIVE  CBC with Differential/Platelet     Status: None   Collection Time: 12/16/18 12:09 PM  Result Value Ref Range   WBC 8.6 4.0 - 10.5 K/uL   RBC 4.43 3.87 - 5.11 MIL/uL   Hemoglobin 13.0 12.0 - 15.0 g/dL   HCT 16.1 09.6 - 04.5 %   MCV 88.7 80.0 - 100.0 fL   MCH 29.3 26.0 - 34.0 pg   MCHC 33.1 30.0 - 36.0 g/dL   RDW 40.9 81.1 - 91.4 %   Platelets 334 150 - 400 K/uL   nRBC 0.0 0.0 - 0.2 %   Neutrophils Relative % 69 %   Neutro Abs 5.9 1.7 - 7.7 K/uL   Lymphocytes Relative 25 %   Lymphs Abs 2.2 0.7 - 4.0 K/uL   Monocytes Relative 5 %   Monocytes Absolute 0.4 0.1 - 1.0 K/uL   Eosinophils Relative 1 %   Eosinophils Absolute 0.1 0.0 - 0.5 K/uL   Basophils Relative 0 %   Basophils Absolute 0.0 0.0 - 0.1 K/uL  hCG, quantitative, pregnancy     Status: Abnormal   Collection Time: 12/16/18 12:09 PM  Result Value Ref Range   hCG, Beta Chain, Quant, S 7,356 (H) <5 mIU/mL  Wet prep, genital     Status: Abnormal   Collection Time: 12/16/18 12:12 PM  Result Value Ref Range    Yeast Wet Prep HPF POC NONE SEEN NONE SEEN   Trich, Wet Prep NONE SEEN NONE SEEN   Clue Cells Wet Prep HPF POC NONE SEEN NONE SEEN   WBC, Wet Prep HPF POC FEW (A) NONE SEEN   Sperm NONE SEEN    US Ob Less Than 14 Weeks With Ob Transvaginal  Result Date: 12/16/2018 CLINICAL DATA:  Abdominal pain affecting pregnancy EXAM: OBSTETRIC <14 WK [place] AND TRANSVAGINAL OB US TECHNIQUE: Both transabdominal and transvaginal ultrasound examinations were performed for complete evaluation of the gestation as well as the maternal uterus, adnexal regions, and pelvic cul-de-sac. Transvaginal technique was performed to assess early pregnancy. COMPARISON:  None. FINDINGS: Intrauterine gestational sac: Single Yolk sac:  Visualized Embryo:  Visualized Cardiac Activity: Visualized Heart Rate: 108 bpm MSD:   mm    w     d CRL:  2.13 mm   5 w   5 d                  [place] EDC: 08/13/2019 Subchorionic hemorrhage:  None visualized. Maternal uterus/adnexae: Large anechoic cystic mass noted in the left ovary measuring up to 8.8 cm. No internal echoes or blood flow. Trace free fluid in the pelvis. IMPRESSION: Five week 5 day intrauterine pregnancy. Fetal heart rate 108 beats per minute. 8.8 cm simple appearing cystic mass in the left ovary. Electronically Signed   By: Charlett Nose M.D.   On: 12/16/2018 13:19     Assessment and Plan  --32 y.o. [id number] at with  IUP at 5w 5d by US performed today --Patient plans to terminate pregnancy, verbalizes understanding of GA --THC positive, reviewed nausea and cramping as possible side effects --Discharge home in stable condition   Calvert CantorSamantha C Weinhold, CNM 12/16/2018, 1:51 PM

## 2018-12-16 NOTE — MAU Note (Signed)
No bleeding, cramping off and on like period was going to start.

## 2018-12-16 NOTE — Discharge Instructions (Signed)

## 2018-12-16 NOTE — MAU Note (Signed)
Pt reports for the last 3 days she has had lower right back pain and lower abd cramping. Nausea.

## 2018-12-19 LAB — GC/CHLAMYDIA PROBE AMP (~~LOC~~) NOT AT ARMC
Chlamydia: NEGATIVE
Neisseria Gonorrhea: NEGATIVE

## 2019-11-01 ENCOUNTER — Encounter (HOSPITAL_COMMUNITY): Payer: Self-pay

## 2022-01-11 ENCOUNTER — Emergency Department (HOSPITAL_BASED_OUTPATIENT_CLINIC_OR_DEPARTMENT_OTHER): Payer: 59

## 2022-01-11 ENCOUNTER — Encounter (HOSPITAL_BASED_OUTPATIENT_CLINIC_OR_DEPARTMENT_OTHER): Payer: Self-pay | Admitting: Emergency Medicine

## 2022-01-11 ENCOUNTER — Other Ambulatory Visit: Payer: Self-pay

## 2022-01-11 ENCOUNTER — Emergency Department (HOSPITAL_BASED_OUTPATIENT_CLINIC_OR_DEPARTMENT_OTHER)
Admission: EM | Admit: 2022-01-11 | Discharge: 2022-01-11 | Disposition: A | Discharge status: Home / Self Care | Payer: 59 | Attending: Emergency Medicine | Admitting: Emergency Medicine

## 2022-01-11 DIAGNOSIS — I2693 Single subsegmental pulmonary embolism without acute cor pulmonale: Secondary | ICD-10-CM

## 2022-01-11 DIAGNOSIS — Z20822 Contact with and (suspected) exposure to covid-19: Secondary | ICD-10-CM | POA: Diagnosis not present

## 2022-01-11 DIAGNOSIS — I2699 Other pulmonary embolism without acute cor pulmonale: Secondary | ICD-10-CM | POA: Insufficient documentation

## 2022-01-11 DIAGNOSIS — R0781 Pleurodynia: Secondary | ICD-10-CM | POA: Diagnosis present

## 2022-01-11 DIAGNOSIS — Z7901 Long term (current) use of anticoagulants: Secondary | ICD-10-CM | POA: Diagnosis not present

## 2022-01-11 LAB — BASIC METABOLIC PANEL
Anion gap: 6 (ref 5–15)
BUN: 10 mg/dL (ref 6–20)
CO2: 25 mmol/L (ref 22–32)
Calcium: 9.1 mg/dL (ref 8.9–10.3)
Chloride: 105 mmol/L (ref 98–111)
Creatinine, Ser: 0.81 mg/dL (ref 0.44–1.00)
GFR, Estimated: >60 mL/min (ref >60)
Glucose, Bld: 83 mg/dL (ref 70–99)
Potassium: 3.9 mmol/L (ref 3.5–5.1)
Sodium: 136 mmol/L (ref 135–145)

## 2022-01-11 LAB — D-DIMER, QUANTITATIVE: D-Dimer, Quant: 0.8 ug/mL-FEU — ABNORMAL HIGH (ref 0.00–0.50)

## 2022-01-11 LAB — CBC
HCT: 38.4 % (ref 36.0–46.0)
Hemoglobin: 11.8 g/dL — ABNORMAL LOW (ref 12.0–15.0)
MCH: 24.5 pg — ABNORMAL LOW (ref 26.0–34.0)
MCHC: 30.7 g/dL (ref 30.0–36.0)
MCV: 79.8 fL — ABNORMAL LOW (ref 80.0–100.0)
Platelets: 336 10*3/uL (ref 150–400)
RBC: 4.81 MIL/uL (ref 3.87–5.11)
RDW: 18.6 % — ABNORMAL HIGH (ref 11.5–15.5)
WBC: 10.3 10*3/uL (ref 4.0–10.5)
nRBC: 0 % (ref 0.0–0.2)

## 2022-01-11 LAB — PREGNANCY, URINE: Preg Test, Ur: NEGATIVE

## 2022-01-11 LAB — RESP PANEL BY RT-PCR (FLU A&B, COVID) ARPGX2
Influenza A by PCR: NEGATIVE
Influenza B by PCR: NEGATIVE
SARS Coronavirus 2 by RT PCR: NEGATIVE

## 2022-01-11 LAB — TROPONIN I (HIGH SENSITIVITY): Troponin I (High Sensitivity): 2 ng/L (ref <18)

## 2022-01-11 MED ORDER — IOHEXOL 350 MG/ML SOLN
100.0000 mL | Freq: Once | INTRAVENOUS | Status: AC | PRN
  Administered 2022-01-11: 75 mL via INTRAVENOUS

## 2022-01-11 MED ORDER — APIXABAN (ELIQUIS) VTE STARTER PACK (10MG AND 5MG): ORAL_TABLET | ORAL | 0 refills | Status: DC

## 2022-01-11 MED ORDER — APIXABAN (ELIQUIS) EDUCATION KIT FOR DVT/PE PATIENTS: PACK | Freq: Once | Status: AC

## 2022-01-11 NOTE — Discharge Instructions (Addendum)
You were evaluated in the Emergency Department and after careful evaluation, we did not find any emergent condition requiring admission or further testing in the hospital.  Your exam/testing today was an acute blood clot in your right upper lobe of your lung.  We will start you on anticoagulation. You will likely need to be on anticoagulation for 3-6 months. It will be important to follow-up outpatient with a care provider to manage this.  Please return to the Emergency Department if you experience any worsening of your condition.  Thank you for allowing [place] to be a part of your care.

## 2022-01-11 NOTE — ED Triage Notes (Signed)
Pt presents with right side chest pain,underneath breath and around to shoulder blade. Pt states it is sharp pain when she breathes the patient is a smoker.

## 2022-01-11 NOTE — ED Notes (Signed)
I spend much time explaining the Eliquis education packet and answer all of her questions. She is aware of the need to go to the [facility] pharmacy to obtain the medication.

## 2022-01-11 NOTE — ED Notes (Signed)
Iv attempt x 2 unsuccessful

## 2022-01-11 NOTE — ED Provider Notes (Signed)
[facility] EMERGENCY DEPT Provider Note   CSN: [id number] Arrival date & time: 01/11/22  3149     History  Chief Complaint  Patient presents with   Chest Pain    Anne Richardson is a 36 y.o. female.   Chest Pain Associated symptoms: shortness of breath    36 year old female presenting to the emergency department with chest pain and shortness of breath.  The patient states that she developed right-sided chest pain that radiates around to her shoulder blade.  She states that the pain is sharp and pleuritic.  She is a smoker.  Shortness of breath is worse with exertion, better with rest.  Denies any fever or chills.  Home Medications Prior to Admission medications   Medication Sig Start Date End Date Taking? Authorizing Provider  APIXABAN (ELIQUIS) VTE STARTER PACK (10MG AND 5MG) Take as directed on package: start with two-64m tablets twice daily for 7 days. On day 8, switch to one-525mtablet twice daily. 01/11/22  Yes LaRegan LemmingMD  ARIPiprazole (ABILIFY) 20 MG tablet Take 1 tablet (20 mg total) by mouth daily. For mood control 05/25/18   NwLindell Spar, NP  ARIPiprazole ER (ABILIFY MAINTENA) 400 MG SRER injection Inject 2 mLs (400 mg total) into the muscle every 28 (twenty-eight) days. (Due on 06-21-18): For mood control 06/21/18   NwLindell Spar, NP  hydrOXYzine (ATARAX/VISTARIL) 50 MG tablet Take 1 tablet (50 mg total) by mouth every 6 (six) hours as needed for anxiety. 05/24/18   NwLindell Spar, NP  lidocaine (LIDODERM) 5 % Place 1 patch onto the skin daily. Remove & Discard patch within 12 hours or as directed by MD: For pain management 05/24/18   NwLindell Spar, NP  sertraline (ZOLOFT) 50 MG tablet Take 1 tablet (50 mg total) by mouth daily. For depression 05/25/18   NwLindell Spar, NP  traZODone (DESYREL) 100 MG tablet Take 1 tablet (100 mg total) by mouth at bedtime as needed for sleep. 05/24/18   NwLindell Spar, NP      Allergies    Penicillins    Review of  Systems   Review of Systems  Respiratory:  Positive for shortness of breath.   Cardiovascular:  Positive for chest pain.  All other systems reviewed and are negative.  Physical Exam Updated Vital Signs BP 134/71 (BP Location: Right Arm)    Pulse 73    Temp 98.3 F (36.8 C) (Oral)    Resp 19    SpO2 99%  Physical Exam Vitals and nursing note reviewed.  Constitutional:      General: She is not in acute distress.    Appearance: She is well-developed.  HENT:     Head: Normocephalic and atraumatic.  Eyes:     Conjunctiva/sclera: Conjunctivae normal.     Pupils: Pupils are equal, round, and reactive to light.  Cardiovascular:     Rate and Rhythm: Normal rate and regular rhythm.     Heart sounds: No murmur heard. Pulmonary:     Effort: Pulmonary effort is normal. No respiratory distress.     Breath sounds: Normal breath sounds.  Abdominal:     General: There is no distension.     Palpations: Abdomen is soft.     Tenderness: There is no abdominal tenderness. There is no guarding.  Musculoskeletal:        General: No swelling, deformity or signs of injury.     Cervical back: Neck supple.  Skin:  General: Skin is warm and dry.     Capillary Refill: Capillary refill takes less than 2 seconds.     Findings: No lesion or rash.  Neurological:     General: No focal deficit present.     Mental Status: She is alert. Mental status is at baseline.  Psychiatric:        Mood and Affect: Mood normal.    ED Results / Procedures / Treatments   Labs (all labs ordered are listed, but only abnormal results are displayed) Labs Reviewed  CBC - Abnormal; Notable for the following components:      Result Value   Hemoglobin 11.8 (*)    MCV 79.8 (*)    MCH 24.5 (*)    RDW 18.6 (*)    All other components within normal limits  D-DIMER, QUANTITATIVE - Abnormal; Notable for the following components:   D-Dimer, Quant 0.80 (*)    All other components within normal limits  RESP PANEL BY RT-PCR  (FLU A&B, COVID) ARPGX2  BASIC METABOLIC PANEL  PREGNANCY, URINE  TROPONIN I (HIGH SENSITIVITY)    EKG EKG Interpretation  Date/Time:  Sunday January 11 2022 08:42:56 EST Ventricular Rate:  70 PR Interval:  168 QRS Duration: 93 QT Interval:  370 QTC Calculation: 400 R Axis:   71 Text Interpretation: Sinus rhythm Confirmed by Regan Lemming ([id number]) on 01/11/2022 9:18:46 AM  Radiology CT Angio Chest PE W and/or Wo Contrast  Result Date: 01/11/2022 CLINICAL DATA:  Evaluate for pulmonary embolus. EXAM: CT ANGIOGRAPHY CHEST WITH CONTRAST TECHNIQUE: Multidetector CT imaging of the chest was performed using the standard protocol during bolus administration of intravenous contrast. Multiplanar CT image reconstructions and MIPs were obtained to evaluate the vascular anatomy. RADIATION DOSE REDUCTION: This exam was performed according to the departmental dose-optimization program which includes automated exposure control, adjustment of the mA and/or kV according to patient size and/or use of iterative reconstruction technique. CONTRAST:  36m OMNIPAQUE IOHEXOL 350 MG/ML SOLN COMPARISON:  Chest radiograph 01/11/2022. FINDINGS: Cardiovascular: Normal heart size. No pericardial effusion. Aorta and main pulmonary artery normal in caliber. Adequate opacification of the pulmonary arterial system. Findings compatible with acute segmental pulmonary embolus involving the right upper lobe (image 80; series 5). Mediastinum/Nodes: No enlarged axillary, mediastinal or hilar lymphadenopathy. Normal appearance of the esophagus. Lungs/Pleura: Central airways are patent. No large area of pulmonary consolidation. No pleural effusion or pneumothorax. 3 mm right lower lobe pulmonary nodule (image 116; series 5). Upper Abdomen: No acute process. Musculoskeletal: No aggressive or acute appearing osseous lesions. Review of the MIP images confirms the above findings. IMPRESSION: Acute segmental right upper lobe pulmonary embolus. 3  mm right solid pulmonary nodule. No routine follow-up imaging is recommended per Fleischner Society Guidelines. These guidelines do not apply to immunocompromised patients and patients with cancer. Follow up in patients with significant comorbidities as clinically warranted. For lung cancer screening, adhere to Lung-RADS guidelines. Reference: Radiology. 2017; 284(1):228-43. Critical Value/emergent results were called by telephone at the time of interpretation on 01/11/2022 at 11:22 am to provider [facility], who verbally acknowledged these results. Electronically Signed   By: DLovey NewcomerM.D.   On: 01/11/2022 11:27   DG Chest Portable 1 View  Result Date: 01/11/2022 CLINICAL DATA:  Chest pain. EXAM: PORTABLE CHEST 1 VIEW COMPARISON:  09/23/2011. FINDINGS: Cardiomediastinal silhouette is within normal limits. Lungs are mildly hypoinflated. No focal consolidation or mass. No pleural effusion or pneumothorax. No acute displaced fracture. IMPRESSION: Mild hypoinflation without acute superimposed  process. Electronically Signed   By: Michaelle Birks M.D.   On: 01/11/2022 10:07    Procedures Procedures    Medications Ordered in ED Medications  iohexol (OMNIPAQUE) 350 MG/ML injection 100 mL (75 mLs Intravenous Contrast Given 01/11/22 1100)  apixaban (ELIQUIS) Education Kit for DVT/PE patients ( Does not apply Given 01/11/22 1217)    ED Course/ Medical Decision Making/ A&P                           Medical Decision Making Amount and/or Complexity of Data Reviewed Labs: ordered. Radiology: ordered.  Risk Prescription drug management.   36 year old female presenting to the emergency department with chest pain and shortness of breath.  The patient states that she developed right-sided chest pain that radiates around to her shoulder blade.  She states that the pain is sharp and pleuritic.  She is a smoker.  Shortness of breath is worse with exertion, better with rest.  On arrival, the patient was  afebrile, hemodynamically stable, mildly tachypneic, RR 24, saturating 100% on room air.  Her tachypnea improved without intervention.  Normal sinus rhythm noted on cardiac telemetry.  EKG on arrival revealed sinus rhythm, ventricular rate 70, no acute ischemic changes noted, intervals normal.  A chest x-ray was performed and reviewed by myself and radiology and revealed no acute abnormality other than mild hyperinflation of the lungs.  Frontal diagnosis includes PE, COVID-19, influenza, other viral URI, pleuritis, pneumothorax, bacterial pneumonia, less likely ACS.  Screening laboratory work-up initiated to include COVID-19 and influenza PCR which was negative, BMP unremarkable, CBC without a leukocytosis, stable anemia to 11.8, troponin normal at 2.  Patient symptoms have been going on for the past week.  Do not think repeat troponin testing is warranted at this time.  A D-dimer was collected and was found to be elevated 0.80.  CTA PE study was ordered to evaluate for PE, subsequently resulted positive for a single segmental right upper lobe PE in addition to a 3 mm right solid pulmonary nodule.  No follow-up imaging was recommended for the pulmonary nodule.  Informed the patient of her diagnosis of pulmonary embolism.  Discussed the risks and benefits of inpatient admission versus discharge.  Spoke with inpatient hospitalist team who felt that given the patient's reassuring vitals and overall presentation, she would be stable for outpatient management without need for observation admission.  Patient is comfortable with the plan for discharge at this time.  Prescription provided for Eliquis starter pack and the patient was advised to follow-up outpatient for further management of her PE and continued anticoagulation.     Final Clinical Impression(s) / ED Diagnoses Final diagnoses:  Acute pulmonary embolism without acute cor pulmonale, unspecified pulmonary embolism type ([place])    Rx / DC Orders ED  Discharge Orders          Ordered    APIXABAN (ELIQUIS) VTE STARTER PACK (10MG AND 5MG)        01/11/22 1147              Regan Lemming, MD 01/12/22 [phone]

## 2022-01-25 ENCOUNTER — Emergency Department (HOSPITAL_BASED_OUTPATIENT_CLINIC_OR_DEPARTMENT_OTHER): Payer: 59

## 2022-01-25 ENCOUNTER — Other Ambulatory Visit: Payer: Self-pay

## 2022-01-25 ENCOUNTER — Emergency Department (HOSPITAL_BASED_OUTPATIENT_CLINIC_OR_DEPARTMENT_OTHER)
Admission: EM | Admit: 2022-01-25 | Discharge: 2022-01-25 | Disposition: A | Discharge status: Home / Self Care | Payer: 59 | Attending: Emergency Medicine | Admitting: Emergency Medicine

## 2022-01-25 ENCOUNTER — Encounter (HOSPITAL_BASED_OUTPATIENT_CLINIC_OR_DEPARTMENT_OTHER): Payer: Self-pay | Admitting: *Deleted

## 2022-01-25 DIAGNOSIS — Z86711 Personal history of pulmonary embolism: Secondary | ICD-10-CM

## 2022-01-25 DIAGNOSIS — R0789 Other chest pain: Secondary | ICD-10-CM

## 2022-01-25 DIAGNOSIS — Z7901 Long term (current) use of anticoagulants: Secondary | ICD-10-CM | POA: Insufficient documentation

## 2022-01-25 DIAGNOSIS — D649 Anemia, unspecified: Secondary | ICD-10-CM

## 2022-01-25 DIAGNOSIS — D75839 Thrombocytosis, unspecified: Secondary | ICD-10-CM

## 2022-01-25 DIAGNOSIS — R0602 Shortness of breath: Secondary | ICD-10-CM | POA: Diagnosis present

## 2022-01-25 DIAGNOSIS — R079 Chest pain, unspecified: Secondary | ICD-10-CM | POA: Insufficient documentation

## 2022-01-25 DIAGNOSIS — R519 Headache, unspecified: Secondary | ICD-10-CM | POA: Diagnosis not present

## 2022-01-25 HISTORY — DX: Other pulmonary embolism without acute cor pulmonale: I26.99

## 2022-01-25 LAB — COMPREHENSIVE METABOLIC PANEL
ALT: 6 U/L (ref 0–44)
AST: 8 U/L — ABNORMAL LOW (ref 15–41)
Albumin: 3.8 g/dL (ref 3.5–5.0)
Alkaline Phosphatase: 68 U/L (ref 38–126)
Anion gap: 6 (ref 5–15)
BUN: 10 mg/dL (ref 6–20)
CO2: 28 mmol/L (ref 22–32)
Calcium: 9.3 mg/dL (ref 8.9–10.3)
Chloride: 105 mmol/L (ref 98–111)
Creatinine, Ser: 0.78 mg/dL (ref 0.44–1.00)
GFR, Estimated: >60 mL/min (ref >60)
Glucose, Bld: 94 mg/dL (ref 70–99)
Potassium: 3.7 mmol/L (ref 3.5–5.1)
Sodium: 139 mmol/L (ref 135–145)
Total Bilirubin: 0.2 mg/dL — ABNORMAL LOW (ref 0.3–1.2)
Total Protein: 7.2 g/dL (ref 6.5–8.1)

## 2022-01-25 LAB — CBC
HCT: 35.4 % — ABNORMAL LOW (ref 36.0–46.0)
Hemoglobin: 10.8 g/dL — ABNORMAL LOW (ref 12.0–15.0)
MCH: 24.6 pg — ABNORMAL LOW (ref 26.0–34.0)
MCHC: 30.5 g/dL (ref 30.0–36.0)
MCV: 80.6 fL (ref 80.0–100.0)
Platelets: 420 10*3/uL — ABNORMAL HIGH (ref 150–400)
RBC: 4.39 MIL/uL (ref 3.87–5.11)
RDW: 18.3 % — ABNORMAL HIGH (ref 11.5–15.5)
WBC: 8.6 10*3/uL (ref 4.0–10.5)
nRBC: 0 % (ref 0.0–0.2)

## 2022-01-25 LAB — TROPONIN I (HIGH SENSITIVITY)
Troponin I (High Sensitivity): <2 ng/L (ref <18)
Troponin I (High Sensitivity): <2 ng/L (ref <18)

## 2022-01-25 MED ORDER — IPRATROPIUM-ALBUTEROL 0.5-2.5 (3) MG/3ML IN SOLN: 3.0000 mL | Freq: Once | RESPIRATORY_TRACT | Status: DC

## 2022-01-25 MED ORDER — IPRATROPIUM-ALBUTEROL 0.5-2.5 (3) MG/3ML IN SOLN
3.0000 mL | Freq: Once | RESPIRATORY_TRACT | Status: AC
  Administered 2022-01-25: 3 mL via RESPIRATORY_TRACT
  Filled 2022-01-25: qty 3

## 2022-01-25 MED ORDER — APIXABAN 5 MG PO TABS: 5.0000 mg | ORAL_TABLET | Freq: Two times a day (BID) | ORAL | 0 refills | Status: DC

## 2022-01-25 MED ORDER — ALBUTEROL SULFATE HFA 108 (90 BASE) MCG/ACT IN AERS
2.0000 | INHALATION_SPRAY | Freq: Once | RESPIRATORY_TRACT | Status: AC
  Administered 2022-01-25: 2 via RESPIRATORY_TRACT
  Filled 2022-01-25: qty 6.7

## 2022-01-25 MED ORDER — IOHEXOL 350 MG/ML SOLN
100.0000 mL | Freq: Once | INTRAVENOUS | Status: AC | PRN
  Administered 2022-01-25: 78 mL via INTRAVENOUS

## 2022-01-25 MED ORDER — ALBUTEROL SULFATE HFA 108 (90 BASE) MCG/ACT IN AERS: 2.0000 | INHALATION_SPRAY | Freq: Four times a day (QID) | RESPIRATORY_TRACT | 3 refills | Status: DC | PRN

## 2022-01-25 NOTE — ED Notes (Signed)
RT Note: Pt. educated on indications, use, frequency of Albuterol Inhaler/spacer and able to teach back.

## 2022-01-25 NOTE — ED Triage Notes (Signed)
Pt c/o sob that started last night. C/o pain to right anterior chest area. States pain comes and goes. States this is similar to when she was diagnosed with a PE. Has been taking her blood thinners. C/o of a headache.

## 2022-01-25 NOTE — ED Notes (Signed)
Patient given discharge instructions. Questions were answered. Patient verbalized understanding of discharge instructions and care at home.  

## 2022-01-25 NOTE — ED Provider Notes (Signed)
[facility] EMERGENCY DEPT Provider Note   CSN: [id number] Arrival date & time: 01/25/22  0341     History  Chief Complaint  Patient presents with   Shortness of Breath    Anne Richardson is a 36 y.o. female.  The history is provided by the patient.  Shortness of Breath She has history of pulmonary embolism anticoagulated on apixaban, and comes in with some pain in her mid chest with some difficulty breathing.  She is states that this feels somewhat similar to how she felt when she was diagnosed with her pulmonary embolism.  She also had some shooting pains in different places in her head with some intermittent dimming of her vision.  She denies any weakness or numbness.  She is concerned that she may have had another blood clot in her lungs.  She has been compliant with taking apixaban, but she is concerned that she only got a 30-day prescription and she does not have a primary care provider who can refill the prescription.  On further questioning, she did not appear to have any precipitating event - there was no history of travel, surgery, exogenous estrogen use.  Her only risk factor was tobacco use.   Home Medications Prior to Admission medications   Medication Sig Start Date End Date Taking? Authorizing Provider  APIXABAN (ELIQUIS) VTE STARTER PACK (10MG  AND 5MG ) Take as directed on package: start with two-5mg  tablets twice daily for 7 days. On day 8, switch to one-5mg  tablet twice daily. 01/11/22   , MD      Allergies    Penicillins    Review of Systems   Review of Systems  Respiratory:  Positive for shortness of breath.   All other systems reviewed and are negative.  Physical Exam Updated Vital Signs BP 96/78    Pulse 70    Temp 98.5 F (36.9 C) (Oral)    Resp 11    Ht 5\' 6"  (1.676 m)    Wt 108.9 kg    LMP 01/25/2022 (Exact Date)    SpO2 100%    BMI 38.74 kg/m  Physical Exam Vitals and nursing note reviewed.  36 year old female, resting  comfortably and in no acute distress. Vital signs are normal. Oxygen saturation is 100%, which is normal. Head is normocephalic and atraumatic. PERRLA, EOMI. Oropharynx is clear. Neck is nontender and supple without adenopathy or JVD. Back is nontender and there is no CVA tenderness. Lungs are clear without rales, wheezes, or rhonchi.  There is a slightly prolonged exhalation phase, and there is slight wheezing noted on the left upper lobe with forced exhalation. Chest is nontender. Heart has regular rate and rhythm without murmur. Abdomen is soft, flat, nontender. Extremities have no cyanosis or edema, full range of motion is present. Skin is warm and dry without rash. Neurologic: Mental status is normal, cranial nerves are intact, moves all extremities equally.  ED Results / Procedures / Treatments   Labs (all labs ordered are listed, but only abnormal results are displayed) Labs Reviewed  CBC - Abnormal; Notable for the following components:      Result Value   Hemoglobin 10.8 (*)    HCT 35.4 (*)    MCH 24.6 (*)    RDW 18.3 (*)    Platelets 420 (*)    All other components within normal limits  COMPREHENSIVE METABOLIC PANEL - Abnormal; Notable for the following components:   AST 8 (*)    Total Bilirubin 0.2 (*)  All other components within normal limits  TROPONIN I (HIGH SENSITIVITY)  TROPONIN I (HIGH SENSITIVITY)    EKG EKG Interpretation  Date/Time:  Sunday January 25 2022 04:23:05 EST Ventricular Rate:  76 PR Interval:  162 QRS Duration: 84 QT Interval:  364 QTC Calculation: 409 R Axis:   78 Text Interpretation: Normal sinus rhythm Normal ECG When compared with ECG of 11-Jan-2022 08:42, No significant change was found Confirmed by Dione Booze ([id number]) on 01/25/2022 4:29:30 AM  Radiology CT Head Wo Contrast  Result Date: 01/25/2022 CLINICAL DATA:  36 year old female with positive PE last month on anticoagulation. Recurrent chest pain. Headache. EXAM: CT HEAD WITHOUT  CONTRAST TECHNIQUE: Contiguous axial images were obtained from the base of the skull through the vertex without intravenous contrast. RADIATION DOSE REDUCTION: This exam was performed according to the departmental dose-optimization program which includes automated exposure control, adjustment of the mA and/or kV according to patient size and/or use of iterative reconstruction technique. COMPARISON:  None. FINDINGS: Brain: Normal cerebral volume. No midline shift, ventriculomegaly, mass effect, evidence of mass lesion, intracranial hemorrhage or evidence of cortically based acute infarction. Gray-white matter differentiation is within normal limits throughout the brain. No encephalomalacia identified. Vascular: Faint ICA calcified atherosclerosis at the skull base. No suspicious intracranial vascular hyperdensity. Skull: Negative. Sinuses/Orbits: Small left maxillary mucous retention cysts. Other visible paranasal sinuses are well aerated. Tympanic cavities and mastoids appear clear. Other: No acute orbit or scalp soft tissue finding. IMPRESSION: Negative noncontrast Head CT. Electronically Signed   By: Odessa Fleming M.D.   On: 01/25/2022 06:45   CT Angio Chest PE W and/or Wo Contrast  Result Date: 01/25/2022 CLINICAL DATA:  36 year old female with positive PE last month on anticoagulation. Recurrent chest pain. Headache. EXAM: CT ANGIOGRAPHY CHEST WITH CONTRAST TECHNIQUE: Multidetector CT imaging of the chest was performed using the standard protocol during bolus administration of intravenous contrast. Multiplanar CT image reconstructions and MIPs were obtained to evaluate the vascular anatomy. RADIATION DOSE REDUCTION: This exam was performed according to the departmental dose-optimization program which includes automated exposure control, adjustment of the mA and/or kV according to patient size and/or use of iterative reconstruction technique. CONTRAST:  55mL OMNIPAQUE IOHEXOL 350 MG/ML SOLN COMPARISON:  Portable  chest today. CTA chest 01/11/2022. FINDINGS: Cardiovascular: Good contrast bolus timing in the pulmonary arterial tree. Previously seen right upper lobe pulmonary emboli appear resolved. No focal filling defect identified in the pulmonary arteries to suggest acute pulmonary embolism. No cardiomegaly or pericardial effusion. Negative visible aorta. No calcified coronary artery atherosclerosis is evident. Mediastinum/Nodes: Negative. No mediastinal mass or lymphadenopathy. Lungs/Pleura: Major airways are patent. Stable lung volumes. No pleural effusion or pulmonary infarct. No acute pulmonary opacity. Upper Abdomen: Negative visible liver, gallbladder, spleen, pancreas, adrenal glands, kidneys, and bowel in the upper abdomen. Musculoskeletal: Degenerative appearing subchondral cyst at the left glenoid. Otherwise negative. Review of the MIP images confirms the above findings. IMPRESSION: 1. Negative for acute pulmonary embolus. Previously seen right upper lobe PE appears resolved. 2. Negative CT appearance of the Chest. 3. 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 Electronically Signed   By: Odessa Fleming M.D.   On: 01/25/2022 06:49   DG Chest Port 1 View  Result Date: 01/25/2022 CLINICAL DATA:  36 year old female shortness of breath and anterior chest pain, diagnosed with acute PE last month on blood thinners. EXAM: PORTABLE CHEST 1 VIEW COMPARISON:  CTA chest 01/11/2022 and earlier. FINDINGS: Portable AP upright view at 0519 hours. Mildly lower lung volumes.  Mediastinal contours remain normal. Visualized tracheal air column is within normal limits. No pneumothorax. No pleural effusion. Allowing for portable technique the lungs are clear. No osseous abnormality identified. IMPRESSION: Negative portable chest. Electronically Signed   By: Odessa Fleming M.D.   On: 01/25/2022 05:52    Procedures Procedures  Cardiac monitor shows normal sinus rhythm per my interpretation.  Medications Ordered in  ED Medications  ipratropium-albuterol (DUONEB) 0.5-2.5 (3) MG/3ML nebulizer solution 3 mL (has no administration in time range)  iohexol (OMNIPAQUE) 350 MG/ML injection 100 mL (78 mLs Intravenous Contrast Given 01/25/22 4356)    ED Course/ Medical Decision Making/ A&P                           Medical Decision Making Amount and/or Complexity of Data Reviewed Labs: ordered. Radiology: ordered.  Risk Prescription drug management.   Chest pain and shortness of breath and patient recently diagnosed with pulmonary embolism.  Old records reviewed showing ED visit on 1/22 with diagnosis of pulmonary embolism and discharged with prescription for apixaban.  Embolism was localized to the right upper lobe.  With normal vital signs, I feel that recurrent pulmonary embolism is very unlikely.  However, because of similarity of symptoms, will proceed with CT angiogram.  We will also check CT of head since she is having new headaches and is anticoagulated.  There is some suggestion of bronchospasm on exam, will give therapeutic trial of albuterol with ipratropium.  ECG was obtained and is normal.  Initial troponin is normal.  There has been a slight drop in hemoglobin compared with 1/22, but she has had anemia in a similar range in the past.  Metabolic panel is normal.  Chest x-ray shows no acute process.  CT of the head shows no acute process.  CT of the chest shows resolution of prior right upper lobe embolism, no acute process.  I have independently viewed these images, and agree with the radiologist's interpretation.  She is getting a nebulizer treatment with albuterol and ipratropium.  Case is signed out to Dr. Renaye Rakers.        Final Clinical Impression(s) / ED Diagnoses Final diagnoses:  Shortness of breath    Rx / DC Orders ED Discharge Orders     None         Dione Booze, MD 01/25/22 [phone]

## 2022-01-25 NOTE — ED Provider Notes (Signed)
Assumed care of patient from Dr [name].  Hx of recently diagnosed PE, now on eliquis x 3 weeks, here with pleuritic chest pain.  CT PE shows no acute PE or PNA or PTX - resolution of prior PE.  Labs and remainder of workup unremarkable.  Patient reportedly had left sided wheezing for Dr [name] - after nebulizer treatment, I listened to her and her lungs are completely clear.  We can prescribe an albuterol inhaler for home for PRN use.  I'll provide hematology office info for f/u and extend her eliquis prescription for another 2 months, as she has almost run out of her 1 month supply and has not been able to schedule a PCP appointment anywhere.  She has a family history of clotting (mother, cousins) with DVT's and Pe's, and she may require a clotting disorder workup, but I will defer this decision to the specialist.   Wyvonnia Dusky, MD 01/25/22 [phone]

## 2022-01-27 ENCOUNTER — Telehealth: Payer: Self-pay | Admitting: Physician Assistant

## 2022-01-27 NOTE — Telephone Encounter (Signed)
Scheduled appt per 2/3 staff msg from Dr. Bertis Ruddy. Pt was referred from the ED. Pt is aware of appt date and time.

## 2022-02-11 ENCOUNTER — Inpatient Hospital Stay: Payer: Medicaid Other

## 2022-02-11 ENCOUNTER — Inpatient Hospital Stay: Payer: Medicaid Other | Attending: Physician Assistant | Admitting: Physician Assistant

## 2022-02-12 ENCOUNTER — Encounter: Payer: Self-pay | Admitting: Critical Care Medicine

## 2022-02-12 ENCOUNTER — Other Ambulatory Visit: Payer: Self-pay

## 2022-02-12 ENCOUNTER — Telehealth: Payer: Self-pay | Admitting: Physician Assistant

## 2022-02-12 ENCOUNTER — Ambulatory Visit: Payer: Medicaid Other | Attending: Critical Care Medicine | Admitting: Critical Care Medicine

## 2022-02-12 VITALS — BP 118/83 | HR 70 | Wt 256.4 lb

## 2022-02-12 DIAGNOSIS — I2699 Other pulmonary embolism without acute cor pulmonale: Secondary | ICD-10-CM | POA: Diagnosis present

## 2022-02-12 DIAGNOSIS — F250 Schizoaffective disorder, bipolar type: Secondary | ICD-10-CM | POA: Diagnosis not present

## 2022-02-12 DIAGNOSIS — N921 Excessive and frequent menstruation with irregular cycle: Secondary | ICD-10-CM | POA: Diagnosis not present

## 2022-02-12 DIAGNOSIS — Z8489 Family history of other specified conditions: Secondary | ICD-10-CM | POA: Diagnosis not present

## 2022-02-12 DIAGNOSIS — Z6841 Body Mass Index (BMI) 40.0 and over, adult: Secondary | ICD-10-CM | POA: Diagnosis not present

## 2022-02-12 DIAGNOSIS — Z23 Encounter for immunization: Secondary | ICD-10-CM | POA: Diagnosis not present

## 2022-02-12 DIAGNOSIS — F1210 Cannabis abuse, uncomplicated: Secondary | ICD-10-CM | POA: Diagnosis not present

## 2022-02-12 DIAGNOSIS — Z79899 Other long term (current) drug therapy: Secondary | ICD-10-CM | POA: Diagnosis not present

## 2022-02-12 DIAGNOSIS — N920 Excessive and frequent menstruation with regular cycle: Secondary | ICD-10-CM | POA: Insufficient documentation

## 2022-02-12 DIAGNOSIS — N925 Other specified irregular menstruation: Secondary | ICD-10-CM | POA: Insufficient documentation

## 2022-02-12 DIAGNOSIS — E669 Obesity, unspecified: Secondary | ICD-10-CM | POA: Insufficient documentation

## 2022-02-12 DIAGNOSIS — Z1159 Encounter for screening for other viral diseases: Secondary | ICD-10-CM

## 2022-02-12 DIAGNOSIS — Z7901 Long term (current) use of anticoagulants: Secondary | ICD-10-CM | POA: Diagnosis not present

## 2022-02-12 DIAGNOSIS — O09529 Supervision of elderly multigravida, unspecified trimester: Secondary | ICD-10-CM | POA: Insufficient documentation

## 2022-02-12 DIAGNOSIS — Z86711 Personal history of pulmonary embolism: Secondary | ICD-10-CM | POA: Insufficient documentation

## 2022-02-12 DIAGNOSIS — Z641 Problems related to multiparity: Secondary | ICD-10-CM

## 2022-02-12 MED ORDER — APIXABAN 5 MG PO TABS
5.0000 mg | ORAL_TABLET | Freq: Two times a day (BID) | ORAL | 0 refills | Status: DC
  Filled 2022-02-12: qty 60, 30d supply, fill #0

## 2022-02-12 MED ORDER — ALBUTEROL SULFATE HFA 108 (90 BASE) MCG/ACT IN AERS
2.0000 | INHALATION_SPRAY | Freq: Four times a day (QID) | RESPIRATORY_TRACT | 3 refills | Status: DC | PRN
Start: 2022-02-12 — End: 2022-12-31
  Filled 2022-02-12: qty 8.5, 25d supply, fill #0

## 2022-02-12 MED ORDER — SERTRALINE HCL 50 MG PO TABS
50.0000 mg | ORAL_TABLET | Freq: Every day | ORAL | 3 refills | Status: DC
  Filled 2022-02-12: qty 30, 30d supply, fill #0

## 2022-02-12 NOTE — Patient Instructions (Signed)
Stay on Eliquis 1 pill twice daily this was sent to our pharmacy downstairs for patient assistance  Begin sertraline 1 pill daily for anxiety  Albuterol inhaler was refilled  Please keep your hematology appointment today they will draw additional labs at that visit  Referral to gynecology was made  Referral to psychiatry was made  [place]ante our clinical social worker will contact you regarding your anxiety  We gave you a handout for active lifestyle management please follow some of the recommendations on the healthy food choices  Return to see Dr. Joya Gaskins 2 months

## 2022-02-12 NOTE — Assessment & Plan Note (Signed)
Patient claims she is not using any substances

## 2022-02-12 NOTE — Assessment & Plan Note (Signed)
Patient with elevated BMI I gave her an active lifestyle management handout for her to focus on dietary changes at this visit

## 2022-02-12 NOTE — Assessment & Plan Note (Signed)
Acute pulmonary emboli involving the right upper lobe arteries peripherally without evidence of shock or cor pulmonale.  Patient has obesity and a positive family history is potential factors.  I have refilled her Eliquis to take 5 mg twice daily course of treatment yet to be determined she does have a hematology appointment today did not draw any labs today I did note she has had menorrhagia and irregular periods and her blood counts were slightly low at the last ER visit.  We will see what hematology has to say but for now she is to take 5 mg of Eliquis until further determined

## 2022-02-12 NOTE — Assessment & Plan Note (Signed)
Patient needs a Pap smear and has mild anemia and now on blood thinner will refer to gynecology

## 2022-02-12 NOTE — Progress Notes (Signed)
New Patient Office Visit  Subjective:  Patient ID: Anne Richardson, female    DOB: [birth year]  Age: 36 y.o. MRN: [id number]  CC:  Chief Complaint  Patient presents with   Hospitalization Follow-up    HPI [name] presents for primary care to establish.  This patient is seen as a posthospital visit.  On arrival blood pressure 118/83.  This patient has a history of significant obesity and developed acute onset shortness of breath cough and right-sided chest pain January 11, 2022.  Below is a copy of the discharge summary from the emergency room.  She was found to have pulmonary emboli started on the starter pack Eliquis.  She came back to the ER on 5 February still with symptoms they gave her in a continuing dosing of Eliquis but she has not filled this yet because her local pharmacy said they did not have this medication in stock.  Patient does have a hematology visit today later this afternoon.  Patient does have menorrhagia and irregular periods and is in need of a Pap smear.  Patient does complain of chronic anxiety and has been seen by mental health in the past has been on Abilify along with sertraline.  She is requesting referral back to psychiatry.  Patient has a positive family history for blood clots including maternal cousin maternal grandmother paternal grandmother  Patient is nulliparous she has 5 children a 104-year-old twin 98-year-old is a 57 and 38 year old.  She has had swelling and pain in both legs but these have not been studied or imaged she has not been on birth control recently.  She works in [employer]  She has had no prior history of blood clotting herself. Seen ED 01/11/22 for PE 36 year old female presenting to the emergency department with chest pain and shortness of breath.  The patient states that she developed right-sided chest pain that radiates around to her shoulder blade.  She states that the pain is sharp and pleuritic.  She is a smoker.   Shortness of breath is worse with exertion, better with rest.   On arrival, the patient was afebrile, hemodynamically stable, mildly tachypneic, RR 24, saturating 100% on room air.  Her tachypnea improved without intervention.  Normal sinus rhythm noted on cardiac telemetry.  EKG on arrival revealed sinus rhythm, ventricular rate 70, no acute ischemic changes noted, intervals normal.  A chest x-ray was performed and reviewed by myself and radiology and revealed no acute abnormality other than mild hyperinflation of the lungs.   Frontal diagnosis includes PE, COVID-19, influenza, other viral URI, pleuritis, pneumothorax, bacterial pneumonia, less likely ACS.   Screening laboratory work-up initiated to include COVID-19 and influenza PCR which was negative, BMP unremarkable, CBC without a leukocytosis, stable anemia to 11.8, troponin normal at 2.  Patient symptoms have been going on for the past week.  Do not think repeat troponin testing is warranted at this time.  A D-dimer was collected and was found to be elevated 0.80.   CTA PE study was ordered to evaluate for PE, subsequently resulted positive for a single segmental right upper lobe PE in addition to a 3 mm right solid pulmonary nodule.  No follow-up imaging was recommended for the pulmonary nodule.  Informed the patient of her diagnosis of pulmonary embolism.  Discussed the risks and benefits of inpatient admission versus discharge.  Spoke with inpatient hospitalist team who felt that given the patient's reassuring vitals and overall presentation, she would be stable for outpatient  management without need for observation admission.  Patient is comfortable with the plan for discharge at this time.  Prescription provided for Eliquis starter pack and the patient was advised to follow-up outpatient for further management of her PE and continued anticoagulation.    returned ED 01/25/22  Assumed care of patient from Dr [name].   Hx of recently diagnosed PE,  now on eliquis x 3 weeks, here with pleuritic chest pain.   CT PE shows no acute PE or PNA or PTX - resolution of prior PE.  Labs and remainder of workup unremarkable.   Patient reportedly had left sided wheezing for Dr [name] - after nebulizer treatment, I listened to her and her lungs are completely clear.  We can prescribe an albuterol inhaler for home for PRN use.   I'll provide hematology office info for f/u and extend her eliquis prescription for another 2 months, as she has almost run out of her 1 month supply and has not been able to schedule a PCP appointment anywhere.  She has a family history of clotting (mother, cousins) with DVT's and Pe's, and she may require a clotting disorder workup, but I will defer this decision to the specialist.    Past Medical History:  Diagnosis Date   Anxiety    Chlamydia    Depression    doing good now   Gestational diabetes    Gonorrhea    Insufficient prenatal care in third trimester 07/20/2017   Onset of care at 36w   Pregnancy induced hypertension    Pulmonary embolism ([place])     Past Surgical History:  Procedure Laterality Date   BIOPSY BREAST     BREAST LUMPECTOMY     left   CESAREAN SECTION     CESAREAN SECTION  06/28/2012   Procedure: CESAREAN SECTION;  Surgeon: Guss Bunde, MD;  Location: [facility];  Service: Gynecology;  Laterality: N/A;   CESAREAN SECTION N/A 03/21/2016   Procedure: CESAREAN SECTION;  Surgeon: Florian Buff, MD;  Location: [facility];  Service: Obstetrics;  Laterality: N/A;   CESAREAN SECTION N/A 07/24/2017   Procedure: CESAREAN SECTION;  Surgeon: Chancy Milroy, MD;  Location: [facility];  Service: Obstetrics;  Laterality: N/A;   WISDOM TOOTH EXTRACTION     age 16    Family History  Problem Relation Age of Onset   Cancer Mother 67       breast   Alcohol abuse Mother    Depression Mother    Hypertension Mother    Depression Sister    Hypertension Sister    Asthma Brother    Depression Brother     Hypertension Brother    Alcohol abuse Maternal Aunt    Hypertension Maternal Aunt    Cancer Maternal Aunt 65       breast ca   Alcohol abuse Maternal Uncle    Hypertension Maternal Uncle    Heart disease Maternal Grandmother    Alcohol abuse Maternal Grandmother    Hypertension Maternal Grandmother    Heart disease Maternal Grandfather    Alcohol abuse Maternal Grandfather    Hypertension Maternal Grandfather    Cancer Maternal Grandfather        prostate and bone   Depression Paternal Grandfather    Alzheimer's disease Paternal Grandmother    Diabetes Paternal Grandmother     Social History   Socioeconomic History   Marital status: Single    Spouse name: Not on file   Number of children:  Not on file   Years of education: Not on file   Highest education level: Not on file  Occupational History   Not on file  Tobacco Use   Smoking status: Every Day    Types: Cigarettes, Cigars   Smokeless tobacco: Never  Vaping Use   Vaping Use: Never used  Substance and Sexual Activity   Alcohol use: No   Drug use: Not Currently    Types: Marijuana   Sexual activity: Yes    Birth control/protection: None  Other Topics Concern   Not on file  Social History Narrative   Single, Lives with infant child, brother, and boyfriend (father of children)    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   HENT: Negative.  Negative for ear pain, postnasal drip, rhinorrhea, sinus pressure, sore throat, trouble swallowing and voice change.   Eyes: Negative.   Respiratory:  Positive for cough and shortness of breath. Negative for apnea, choking, chest tightness,  wheezing and stridor.   Cardiovascular:  Positive for chest pain and leg swelling. Negative for palpitations.  Gastrointestinal: Negative.  Negative for abdominal distention, abdominal pain, nausea and vomiting.  Genitourinary:  Positive for menstrual problem, pelvic pain and vaginal bleeding.  Musculoskeletal: Negative.  Negative for arthralgias and myalgias.  Skin: Negative.  Negative for rash.  Allergic/Immunologic: Negative.  Negative for environmental allergies and food allergies.  Neurological: Negative.  Negative for dizziness, syncope, weakness and headaches.  Hematological: Negative.  Negative for adenopathy. Does not bruise/bleed easily.  Psychiatric/Behavioral: Negative.  Negative for agitation and sleep disturbance. The patient is not nervous/anxious.    Objective:   Today's Vitals: BP 118/83    Pulse 70    Wt 256 lb 6.4 oz (116.3 kg)    LMP 01/25/2022 (Exact Date)    SpO2 97%    BMI 41.38 kg/m   Physical Exam Vitals reviewed.  Constitutional:      Appearance: Normal appearance. She is well-developed. She is obese. She is not diaphoretic.  HENT:     Head: Normocephalic and atraumatic.     Nose: No nasal deformity, septal deviation, mucosal edema or rhinorrhea.     Right Sinus: No maxillary sinus tenderness or frontal sinus tenderness.     Left Sinus: No maxillary sinus tenderness or frontal sinus tenderness.     Mouth/Throat:     Pharynx: No oropharyngeal exudate.  Eyes:     General: No scleral icterus.    Conjunctiva/sclera: Conjunctivae normal.     Pupils: Pupils are equal, round, and reactive to light.  Neck:     Thyroid: No thyromegaly.     Vascular: No carotid bruit or JVD.     Trachea: Trachea normal. No tracheal tenderness or tracheal deviation.  Cardiovascular:     Rate and Rhythm: Normal rate and regular rhythm.     Chest Wall: PMI is not displaced.     Pulses: Normal pulses. No decreased pulses.     Heart sounds: Normal heart sounds, S1 normal and S2  normal. Heart sounds not distant. No murmur heard. No systolic murmur is present.  No diastolic murmur is present.  No friction rub. No gallop. No S3 or S4 sounds.  Pulmonary:     Effort: Pulmonary effort is normal. No tachypnea, accessory muscle usage or respiratory distress.     Breath sounds: Normal breath sounds. No stridor. No decreased breath sounds, wheezing, rhonchi or rales.  Chest:     Chest wall: No tenderness.  Abdominal:     General: Bowel sounds are normal. There is no distension.     Palpations: Abdomen is soft. Abdomen is not rigid.     Tenderness: There is abdominal tenderness. There is no guarding or rebound.     Comments: Tender in the lower abdomen around the suprapubic area without guarding or rebound  Musculoskeletal:        General: Normal range of motion.     Cervical back: Normal range of motion and neck supple. No edema, erythema or rigidity. No muscular tenderness. Normal range of motion.  Lymphadenopathy:     Head:     Right side of head: No submental or submandibular adenopathy.     Left side of head: No submental or submandibular adenopathy.     Cervical: No cervical adenopathy.  Skin:    General: Skin is warm and dry.     Coloration: Skin is not pale.     Findings: No rash.     Nails: There is no clubbing.  Neurological:     General: No focal deficit present.     Mental Status: She is alert and oriented to person, place, and time. Mental status is at baseline.     Sensory: No sensory deficit.  Psychiatric:        Mood and Affect: Mood normal.        Speech: Speech normal.        Behavior: Behavior normal.        Thought Content: Thought content normal.        Judgment: Judgment normal.    Assessment & Plan:   Problem List Items Addressed This Visit       Cardiovascular and Mediastinum   Acute pulmonary embolism ([place]) - Primary    Acute pulmonary emboli involving the right upper lobe arteries peripherally without evidence of shock or cor  pulmonale.  Patient has obesity and a positive family history is potential factors.  I have refilled her Eliquis to take 5 mg twice daily course of treatment yet to be determined she does have a hematology appointment today did not draw any labs today I did note she has had menorrhagia and irregular periods and her blood counts were slightly low at the last ER visit.  We will see what hematology has to say but for now she is to take 5 mg of Eliquis until further determined      Relevant Medications   apixaban (ELIQUIS) 5 MG TABS tablet     Other   Schizoaffective disorder, bipolar type ([place])    History of bipolar schizoaffective disorder we will begin sertraline 50 mg daily and make referral to psychiatry we will also have clinical social work connect with her from our clinic      Relevant Orders   Ambulatory referral to Psychiatry   Cannabis abuse    Patient claims she is not using any substances      Menorrhagia with irregular cycle    Patient needs a Pap smear and has mild anemia and now on blood thinner will refer to gynecology      Relevant Orders   Ambulatory referral  to Gynecology   Multiparous   Relevant Orders   Ambulatory referral to Gynecology   BMI 40.0-44.9, adult [facility])    Patient with elevated BMI I gave her an active lifestyle management handout for her to focus on dietary changes at this visit      Other Visit Diagnoses     Need for hepatitis C screening test       Need for immunization against influenza       Relevant Orders   Flu Vaccine QUAD 77mo+IM (Fluarix, Fluzone & Alfiuria Quad PF) (Completed)       Outpatient Encounter Medications as of 02/12/2022  Medication Sig   sertraline (ZOLOFT) 50 MG tablet Take 1 tablet (50 mg total) by mouth daily.   albuterol (VENTOLIN HFA) 108 (90 Base) MCG/ACT inhaler Inhale 2 puffs into the lungs every 6 (six) hours as needed for wheezing or shortness of breath.   apixaban (ELIQUIS) 5 MG TABS tablet Take 1 tablet  (5 mg total) by mouth 2 (two) times daily.   [DISCONTINUED] albuterol (VENTOLIN HFA) 108 (90 Base) MCG/ACT inhaler Inhale 2 puffs into the lungs every 6 (six) hours as needed for wheezing or shortness of breath.   [DISCONTINUED] apixaban (ELIQUIS) 5 MG TABS tablet Take 1 tablet (5 mg total) by mouth 2 (two) times daily.   [DISCONTINUED] APIXABAN (ELIQUIS) VTE STARTER PACK (10MG  AND 5MG ) Take as directed on package: start with two-5mg  tablets twice daily for 7 days. On day 8, switch to one-5mg  tablet twice daily.   No facility-administered encounter medications on file as of 02/12/2022.   45 minutes spent reviewing history and physical examining patient high risk patient with high complexity decision making Follow-up: Return in about 2 months (around 04/12/2022).   Asencion Noble, MD

## 2022-02-12 NOTE — Assessment & Plan Note (Signed)
History of bipolar schizoaffective disorder we will begin sertraline 50 mg daily and make referral to psychiatry we will also have clinical social work connect with her from our clinic

## 2022-02-12 NOTE — Telephone Encounter (Signed)
R/s pt's new hem appt. Pt missed appt yesterday. Pt is aware of new appt date and time. Pt is aware to arrive 15 mins prior to appt. Also gave pt a calendar print out a I r/s her appt in person.

## 2022-03-03 ENCOUNTER — Inpatient Hospital Stay: Payer: Medicaid Other

## 2022-03-03 ENCOUNTER — Inpatient Hospital Stay: Payer: Medicaid Other | Attending: Physician Assistant | Admitting: Physician Assistant

## 2022-04-14 NOTE — Progress Notes (Incomplete)
? ?New Patient Office Visit ? ?Subjective:  ?Patient ID: Anne BlazerChiquita J Richardson, female    DOB: 02/04/1986  Age: 36 y.o. MRN: [id number] ? ?CC:  ?No chief complaint on file. ? ? ?HPI ?01/2022 ?Vic BlackbirdChiquita J Cecilio Richardson presents for primary care to establish.  This patient is seen as a posthospital visit.  On arrival blood pressure 118/83.  This patient has a history of significant obesity and developed acute onset shortness of breath cough and right-sided chest pain January 11, 2022.  Below is a copy of the discharge summary from the emergency room.  She was found to have pulmonary emboli started on the starter pack Eliquis.  She came back to the ER on 5 February still with symptoms they gave her in a continuing dosing of Eliquis but she has not filled this yet because her local pharmacy said they did not have this medication in stock. ? ?Patient does have a hematology visit today later this afternoon. ? ?Patient does have menorrhagia and irregular periods and is in need of a Pap smear. ? ?Patient does complain of chronic anxiety and has been seen by mental health in the past has been on Abilify along with sertraline.  She is requesting referral back to psychiatry. ? ?Patient has a positive family history for blood clots including maternal cousin maternal grandmother paternal grandmother ? ?Patient is nulliparous she has 5 children a 38-year-old twin 38-year-old is a 229 and 38 year old. ? ?She has had swelling and pain in both legs but these have not been studied or imaged she has not been on birth control recently. ? 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 ?She has had no prior history of blood clotting herself. ?Seen ED 01/11/22 for PE ?36 year old female presenting to the emergency department with chest pain and shortness of breath.  The patient states that she developed right-sided chest pain that radiates around to her shoulder blade.  She states that the pain is sharp and pleuritic.  She is a smoker.  Shortness of breath is worse with  exertion, better with rest. ?  ?On arrival, the patient was afebrile, hemodynamically stable, mildly tachypneic, RR 24, saturating 100% on room air.  Her tachypnea improved without intervention.  Normal sinus rhythm noted on cardiac telemetry.  EKG on arrival revealed sinus rhythm, ventricular rate 70, no acute ischemic changes noted, intervals normal.  A chest x-ray was performed and reviewed by myself and radiology and revealed no acute abnormality other than mild hyperinflation of the lungs. ?  ?Frontal diagnosis includes PE, COVID-19, influenza, other viral URI, pleuritis, pneumothorax, bacterial pneumonia, less likely ACS. ?  ?Screening laboratory work-up initiated to include COVID-19 and influenza PCR which was negative, BMP unremarkable, CBC without a leukocytosis, stable anemia to 11.8, troponin normal at 2.  Patient symptoms have been going on for the past week.  Do not think repeat troponin testing is warranted at this time.  A D-dimer was collected and was found to be elevated 0.80. ?  ?CTA PE study was ordered to evaluate for PE, subsequently resulted positive for a single segmental right upper lobe PE in addition to a 3 mm right solid pulmonary nodule.  No follow-up imaging was recommended for the pulmonary nodule.  Informed the patient of her diagnosis of pulmonary embolism.  Discussed the risks and benefits of inpatient admission versus discharge.  Spoke with inpatient hospitalist team who felt that given the patient's reassuring vitals and overall presentation, she would be stable for outpatient management without need for observation  admission.  Patient is comfortable with the plan for discharge at this time.  Prescription provided for Eliquis starter pack and the patient was advised to follow-up outpatient for further management of her PE and continued anticoagulation. ?  ? returned ED 01/25/22  ?Assumed care of patient from Dr Preston Fleeting. ?  ?Hx of recently diagnosed PE, now on eliquis x 3 weeks, here  with pleuritic chest pain. ?  ?CT PE shows no acute PE or PNA or PTX - resolution of prior PE. ? ?Labs and remainder of workup unremarkable. ?  ?Patient reportedly had left sided wheezing for Dr Preston Fleeting - after nebulizer treatment, I listened to her and her lungs are completely clear.  We can prescribe an albuterol inhaler for home for PRN use. ?  ?I'll provide hematology office info for f/u and extend her eliquis prescription for another 2 months, as she has almost run out of her 1 month supply and has not been able to schedule a PCP appointment anywhere.  She has a family history of clotting (mother, cousins) with DVT's and Pe's, and she may require a clotting disorder workup, but I will defer this decision to the specialist. ?  ?04/15/22 ? Cardiovascular and Mediastinum  ? Acute pulmonary embolism ([facility]) - Primary  ?  Acute pulmonary emboli involving the right upper lobe arteries peripherally without evidence of shock or cor pulmonale. ? ?Patient has obesity and a positive family history is potential factors. ? ?I have refilled her Eliquis to take 5 mg twice daily course of treatment yet to be determined she does have a hematology appointment today did not draw any labs today I did note she has had menorrhagia and irregular periods and her blood counts were slightly low at the last ER visit. ? ?We will see what hematology has to say but for now she is to take 5 mg of Eliquis until further determined ?  ?  ? Relevant Medications  ? apixaban (ELIQUIS) 5 MG TABS tablet  ?  ? Other  ? Schizoaffective disorder, bipolar type ([facility])  ?  History of bipolar schizoaffective disorder we will begin sertraline 50 mg daily and make referral to psychiatry we will also have clinical social work connect with her from our clinic ?  ?  ? Relevant Orders  ? Ambulatory referral to Psychiatry  ? Cannabis abuse  ?  Patient claims she is not using any substances ?  ?  ? Menorrhagia with irregular cycle  ?  Patient needs a Pap smear and has  mild anemia and now on blood thinner will refer to gynecology ?  ?  ? Relevant Orders  ? Ambulatory referral to Gynecology  ? Multiparous  ? Relevant Orders  ? Ambulatory referral to Gynecology  ? BMI 40.0-44.9, adult ([facility])  ?  Patient with elevated BMI I gave her an active lifestyle management handout for her to focus on dietary changes at this visit ?  ?  ? ?Other Visit Diagnoses   ? ? Need for hepatitis C screening test      ? Need for immunization against influenza      ? Relevant Orders  ? Flu Vaccine QUAD 52mo+IM (Fluarix, Fluzone & Alfiuria Quad PF) (Completed)  ? ?  ?Missed Heme appt ? ? ?Outpatient Encounter Medications as of 02/12/2022  ?Medication Sig  ?? sertraline (ZOLOFT) 50 MG tablet Take 1 tablet (50 mg total) by mouth daily.  ?? albuterol (VENTOLIN HFA) 108 (90 Base) MCG/ACT inhaler Inhale 2 puffs into  the lungs every 6 (six) hours as needed for wheezing or shortness of breath.  ?? apixaban (ELIQUIS) 5 MG TABS tablet Take 1 tablet (5 mg total) by mouth 2 (two) times daily.  ?? [DISCONTINUED] albuterol (VENTOLIN HFA) 108 (90 Base) MCG/ACT inhaler Inhale 2 puffs into the lungs every 6 (six) hours as needed for wheezing or shortness of breath.  ?? [DISCONTINUED] apixaban (ELIQUIS) 5 MG TABS tablet Take 1 tablet (5 mg total) by mouth 2 (two) times daily.  ?? [DISCONTINUED] APIXABAN (ELIQUIS) VTE STARTER PACK (10MG  AND 5MG ) Take as directed on package: start with two-5mg  tablets twice daily for 7 days. On day 8, switch to one-5mg  tablet twice daily.  ? ?Past Medical History:  ?Diagnosis Date  ?? Anxiety   ?? Chlamydia   ?? Depression   ? doing good now  ?? Gestational diabetes   ?? Gonorrhea   ?? Insufficient prenatal care in third trimester 07/20/2017  ? Onset of care at 38w  ?? Pregnancy induced hypertension   ?? Pulmonary embolism ([facility])   ? ? ?Past Surgical History:  ?Procedure Laterality Date  ?? BIOPSY BREAST    ?? BREAST LUMPECTOMY    ? left  ?? CESAREAN SECTION    ?? CESAREAN SECTION  06/28/2012  ?  Procedure: CESAREAN SECTION;  Surgeon: 07/22/2017, MD;  Location: [facility];  Service: Gynecology;  Laterality: N/A;  ?? CESAREAN SECTION N/A 03/21/2016  ? Procedure: CESAREAN SECTION;  Surgeon: Lesly Dukes

## 2022-04-15 ENCOUNTER — Ambulatory Visit: Payer: Medicaid Other | Admitting: Critical Care Medicine

## 2022-11-29 ENCOUNTER — Inpatient Hospital Stay (HOSPITAL_COMMUNITY): Payer: Medicaid Other

## 2022-11-29 ENCOUNTER — Inpatient Hospital Stay (HOSPITAL_COMMUNITY)
Admission: AD | Admit: 2022-11-29 | Discharge: 2022-12-01 | DRG: 833 | Disposition: A | Discharge status: Home / Self Care | Payer: Medicaid Other | Attending: Obstetrics and Gynecology | Admitting: Obstetrics and Gynecology

## 2022-11-29 ENCOUNTER — Other Ambulatory Visit: Payer: Self-pay

## 2022-11-29 ENCOUNTER — Encounter (HOSPITAL_COMMUNITY): Payer: Self-pay | Admitting: Obstetrics & Gynecology

## 2022-11-29 DIAGNOSIS — O23591 Infection of other part of genital tract in pregnancy, first trimester: Secondary | ICD-10-CM | POA: Diagnosis present

## 2022-11-29 DIAGNOSIS — O26891 Other specified pregnancy related conditions, first trimester: Principal | ICD-10-CM | POA: Diagnosis present

## 2022-11-29 DIAGNOSIS — Z6841 Body Mass Index (BMI) 40.0 and over, adult: Secondary | ICD-10-CM

## 2022-11-29 DIAGNOSIS — Z98891 History of uterine scar from previous surgery: Secondary | ICD-10-CM

## 2022-11-29 DIAGNOSIS — B9689 Other specified bacterial agents as the cause of diseases classified elsewhere: Secondary | ICD-10-CM

## 2022-11-29 DIAGNOSIS — O0991 Supervision of high risk pregnancy, unspecified, first trimester: Secondary | ICD-10-CM | POA: Diagnosis present

## 2022-11-29 DIAGNOSIS — O009 Unspecified ectopic pregnancy without intrauterine pregnancy: Secondary | ICD-10-CM | POA: Diagnosis present

## 2022-11-29 DIAGNOSIS — N760 Acute vaginitis: Secondary | ICD-10-CM | POA: Diagnosis present

## 2022-11-29 DIAGNOSIS — Z880 Allergy status to penicillin: Secondary | ICD-10-CM

## 2022-11-29 DIAGNOSIS — Z3A08 8 weeks gestation of pregnancy: Secondary | ICD-10-CM

## 2022-11-29 DIAGNOSIS — O008 Other ectopic pregnancy without intrauterine pregnancy: Secondary | ICD-10-CM

## 2022-11-29 DIAGNOSIS — Z87891 Personal history of nicotine dependence: Secondary | ICD-10-CM

## 2022-11-29 DIAGNOSIS — Z86711 Personal history of pulmonary embolism: Secondary | ICD-10-CM

## 2022-11-29 DIAGNOSIS — R1031 Right lower quadrant pain: Secondary | ICD-10-CM | POA: Diagnosis present

## 2022-11-29 LAB — URINALYSIS, ROUTINE W REFLEX MICROSCOPIC
Bilirubin Urine: NEGATIVE
Glucose, UA: NEGATIVE mg/dL
Hgb urine dipstick: NEGATIVE
Ketones, ur: NEGATIVE mg/dL
Nitrite: POSITIVE — AB
Protein, ur: 30 mg/dL — AB
Specific Gravity, Urine: 1.023 (ref 1.005–1.030)
pH: 7 (ref 5.0–8.0)

## 2022-11-29 LAB — WET PREP, GENITAL
Sperm: NONE SEEN
Trich, Wet Prep: NONE SEEN
WBC, Wet Prep HPF POC: >=10 — AB (ref <10)
Yeast Wet Prep HPF POC: NONE SEEN

## 2022-11-29 LAB — ABO/RH: ABO/RH(D): B POS

## 2022-11-29 LAB — CBC
HCT: 33.8 % — ABNORMAL LOW (ref 36.0–46.0)
Hemoglobin: 11 g/dL — ABNORMAL LOW (ref 12.0–15.0)
MCH: 25.3 pg — ABNORMAL LOW (ref 26.0–34.0)
MCHC: 32.5 g/dL (ref 30.0–36.0)
MCV: 77.9 fL — ABNORMAL LOW (ref 80.0–100.0)
Platelets: 361 10*3/uL (ref 150–400)
RBC: 4.34 MIL/uL (ref 3.87–5.11)
RDW: 19 % — ABNORMAL HIGH (ref 11.5–15.5)
WBC: 10 10*3/uL (ref 4.0–10.5)
nRBC: 0 % (ref 0.0–0.2)

## 2022-11-29 LAB — HCG, QUANTITATIVE, PREGNANCY: hCG, Beta Chain, Quant, S: 86650 m[IU]/mL — ABNORMAL HIGH (ref <5)

## 2022-11-29 LAB — POCT PREGNANCY, URINE: Preg Test, Ur: POSITIVE — AB

## 2022-11-29 MED ORDER — GADOBUTROL 1 MMOL/ML IV SOLN
10.0000 mL | Freq: Once | INTRAVENOUS | Status: AC | PRN
  Administered 2022-11-29: 10 mL via INTRAVENOUS

## 2022-11-29 MED ORDER — HYDROMORPHONE HCL 1 MG/ML IJ SOLN
1.0000 mg | Freq: Once | INTRAMUSCULAR | Status: AC
  Administered 2022-11-29: 1 mg via INTRAVENOUS
  Filled 2022-11-29: qty 1

## 2022-11-29 MED ORDER — ONDANSETRON HCL 4 MG/2ML IJ SOLN
4.0000 mg | Freq: Once | INTRAMUSCULAR | Status: AC
  Administered 2022-11-29: 4 mg via INTRAVENOUS
  Filled 2022-11-29: qty 2

## 2022-11-29 NOTE — MAU Note (Signed)
.  Anne Richardson is a 36 y.o. at Unknown here in MAU reporting: patient brought in by EMS stating pain in her lower middle abdomen started last night and worsened throughout the day. States it is worse on her right side and it radiates to her back and rectum. States she has not had a BM since Thursday. Denies any pain with urination but had noticed an increase in frequency. Denies any vaginal bleeding. Denies any discolored vaginal discharge, odor or itching. States she has been nauseated and vomiting all day.   Onset of complaint: last night  Pain score: 10 Vitals:   11/29/22 1743  BP: (!) 113/52  Pulse: 80  Resp: 18  Temp: 98 F (36.7 C)

## 2022-11-29 NOTE — MAU Note (Signed)
Pt to MRI via stretcher.

## 2022-11-29 NOTE — MAU Provider Note (Cosign Needed)
History     CSN: [id number]  Arrival date and time: 11/29/22 1737   Event Date/Time   First Provider Initiated Contact with Patient 11/29/22 1806      Chief Complaint  Patient presents with   Back Pain   Abdominal Pain   Constipation   Nausea   Emesis   Anne Richardson , a  36 y.o. [id number] at [redacted]w[redacted]d presents to MAU with complaints of abdominal pain around her Navel and pelvic pain with cramps in low back and rectum. Patient states this Started day before yesterday. Currently rates pain as 10/10 and is only improved by laying on her stomach. Describes pain as "Constant and sharp cramping" from her navel to midline of the pelvis and towards her right hip and low back. Patient  states "it hurts to walk and to stand".  She also endorses Nausea and vomiting that started today with pain @ 2pm. Patient reports multiple known cysts on right ovary.  Denies fever and chills. Patient states she threw up on the truck and was given Zofran. Denies vomiting since arrival to MAU. Denies vaginal and urinary symptoms. Denies vaginal bleeding. Endorses constipation. Patient reports Last BM was Thursday.        Back Pain Associated symptoms include abdominal pain and pelvic pain. Pertinent negatives include no chest pain, dysuria, fever, headaches or weakness.  Abdominal Pain Associated symptoms include constipation and vomiting. Pertinent negatives include no diarrhea, dysuria, fever, headaches or nausea.  Constipation Associated symptoms include abdominal pain, back pain and vomiting. Pertinent negatives include no diarrhea, difficulty urinating, fever or nausea.  Emesis  Associated symptoms include abdominal pain. Pertinent negatives include no chest pain, chills, diarrhea, fever or headaches.   OB History     Gravida  8   Para  4   Term  3   Preterm  1   AB  2   Living  5      SAB  2   IAB  0   Ectopic      Multiple  1   Live Births  5           Past Medical  History:  Diagnosis Date   Anxiety    Chlamydia    Depression    doing good now   Gestational diabetes    Gonorrhea    Insufficient prenatal care in third trimester 07/20/2017   Onset of care at 38w   Pregnancy induced hypertension    Pulmonary embolism (HCC)     Past Surgical History:  Procedure Laterality Date   BIOPSY BREAST     BREAST LUMPECTOMY     left   CESAREAN SECTION     CESAREAN SECTION  06/28/2012   Procedure: CESAREAN SECTION;  Surgeon: Lesly Dukes, MD;  Location: [facility];  Service: Gynecology;  Laterality: N/A;   CESAREAN SECTION N/A 03/21/2016   Procedure: CESAREAN SECTION;  Surgeon: Lazaro Arms, MD;  Location: [facility];  Service: Obstetrics;  Laterality: N/A;   CESAREAN SECTION N/A 07/24/2017   Procedure: CESAREAN SECTION;  Surgeon: [name], MD;  Location: [facility];  Service: Obstetrics;  Laterality: N/A;   WISDOM TOOTH EXTRACTION     age 33    Family History  Problem Relation Age of Onset   Cancer Mother 63       breast   Alcohol abuse Mother    Depression Mother    Hypertension Mother    Depression Sister    Hypertension  Sister    Asthma Brother    Depression Brother    Hypertension Brother    Alcohol abuse Maternal Aunt    Hypertension Maternal Aunt    Cancer Maternal Aunt 71       breast ca   Alcohol abuse Maternal Uncle    Hypertension Maternal Uncle    Heart disease Maternal Grandmother    Alcohol abuse Maternal Grandmother    Hypertension Maternal Grandmother    Heart disease Maternal Grandfather    Alcohol abuse Maternal Grandfather    Hypertension Maternal Grandfather    Cancer Maternal Grandfather        prostate and bone   Depression Paternal Grandfather    Alzheimer's disease Paternal Grandmother    Diabetes Paternal Grandmother     Social History   Tobacco Use   Smoking status: Former    Types: Cigarettes, Cigars   Smokeless tobacco: Never  Vaping Use   Vaping Use: Never used  Substance Use Topics    Alcohol use: No   Drug use: Not Currently    Types: Marijuana    Allergies:  Allergies  Allergen Reactions   Penicillins Itching and Swelling    Has patient had a PCN reaction causing immediate rash, facial/tongue/throat swelling, SOB or lightheadedness with hypotension: No Has patient had a PCN reaction causing severe rash involving mucus membranes or skin necrosis: No Has patient had a PCN reaction that required hospitalization No Has patient had a PCN reaction occurring within the last 10 years: No If all of the above answers are "NO", then may proceed with Cephalosporin use.     Medications Prior to Admission  Medication Sig Dispense Refill Last Dose   albuterol (VENTOLIN HFA) 108 (90 Base) MCG/ACT inhaler Inhale 2 puffs into the lungs every 6 (six) hours as needed for wheezing or shortness of breath. 8.5 g 3 Unknown   apixaban (ELIQUIS) 5 MG TABS tablet Take 1 tablet (5 mg total) by mouth 2 (two) times daily. 120 tablet 0    sertraline (ZOLOFT) 50 MG tablet Take 1 tablet (50 mg total) by mouth daily. 30 tablet 3 Unknown    Review of Systems  Constitutional:  Positive for diaphoresis. Negative for chills, fatigue and fever.  Eyes:  Negative for pain and visual disturbance.  Respiratory:  Negative for apnea, shortness of breath and wheezing.   Cardiovascular:  Negative for chest pain and palpitations.  Gastrointestinal:  Positive for abdominal pain, constipation and vomiting. Negative for diarrhea and nausea.  Genitourinary:  Positive for flank pain and pelvic pain. Negative for difficulty urinating, dysuria, vaginal bleeding, vaginal discharge and vaginal pain.  Musculoskeletal:  Positive for back pain.  Neurological:  Negative for seizures, weakness and headaches.  Psychiatric/Behavioral:  Negative for suicidal ideas.    Physical Exam   Blood pressure (!) 102/55, pulse 91, temperature 98 F (36.7 C), temperature source Oral, resp. rate 18, height 5\' 6"  (1.676 m), weight  119.3 kg, last menstrual period 10/03/2022, SpO2 100 %, unknown if currently breastfeeding.  Physical Exam Vitals and nursing note reviewed.  Constitutional:      General: She is in acute distress.     Appearance: Normal appearance. She is obese.  HENT:     Head: Normocephalic.  Cardiovascular:     Rate and Rhythm: Normal rate and regular rhythm.  Pulmonary:     Effort: Pulmonary effort is normal.  Abdominal:     Palpations: Abdomen is soft.     Tenderness: There is abdominal tenderness  in the right lower quadrant and periumbilical area. There is right CVA tenderness, guarding and rebound.  Musculoskeletal:     Cervical back: Normal range of motion.  Skin:    General: Skin is warm and dry.     Capillary Refill: Capillary refill takes less than 2 seconds.  Neurological:     Mental Status: She is alert and oriented to person, place, and time.  Psychiatric:        Mood and Affect: Mood normal.     MAU Course  Procedures Orders Placed This Encounter  Procedures   Wet prep, genital   US OB LESS THAN 14 WEEKS WITH OB TRANSVAGINAL   US RENAL   MR Abdomen W or Wo Contrast   US APPENDIX (ABDOMEN LIMITED)   Urinalysis, Routine w reflex microscopic Urine, Clean Catch   CBC   hCG, quantitative, pregnancy   CBC   Comprehensive metabolic panel   Diet NPO time specified   Pregnancy, urine POC   ABO/Rh   Admit to Inpatient (patient's expected length of stay will be greater than 2 midnights or inpatient only procedure)   Results for orders placed or performed during the hospital encounter of 11/29/22 (from the past 24 hour(s))  Pregnancy, urine POC     Status: Abnormal   Collection Time: 11/29/22  5:59 PM  Result Value Ref Range   Preg Test, Ur POSITIVE (A) NEGATIVE  Urinalysis, Routine w reflex microscopic     Status: Abnormal   Collection Time: 11/29/22  6:01 PM  Result Value Ref Range   Color, Urine YELLOW YELLOW   APPearance CLOUDY (A) CLEAR   Specific Gravity, Urine 1.023  1.005 - 1.030   pH 7.0 5.0 - 8.0   Glucose, UA NEGATIVE NEGATIVE mg/dL   Hgb urine dipstick NEGATIVE NEGATIVE   Bilirubin Urine NEGATIVE NEGATIVE   Ketones, ur NEGATIVE NEGATIVE mg/dL   Protein, ur 30 (A) NEGATIVE mg/dL   Nitrite POSITIVE (A) NEGATIVE   Leukocytes,Ua TRACE (A) NEGATIVE   RBC / HPF 0-5 0 - 5 RBC/hpf   WBC, UA 6-10 0 - 5 WBC/hpf   Bacteria, UA MANY (A) NONE SEEN   Squamous Epithelial / LPF 6-10 0 - 5   Mucus PRESENT   ABO/Rh     Status: None   Collection Time: 11/29/22  6:18 PM  Result Value Ref Range   ABO/RH(D) B POS    No rh immune globuloin      NOT A RH IMMUNE GLOBULIN CANDIDATE, PT RH POSITIVE Performed at [facility], [address] [place], [place] [address]   CBC     Status: Abnormal   Collection Time: 11/29/22  6:19 PM  Result Value Ref Range   WBC 10.0 4.0 - 10.5 K/uL   RBC 4.34 3.87 - 5.11 MIL/uL   Hemoglobin 11.0 (L) 12.0 - 15.0 g/dL   HCT 66.4 (L) 40.3 - 47.4 %   MCV 77.9 (L) 80.0 - 100.0 fL   MCH 25.3 (L) 26.0 - 34.0 pg   MCHC 32.5 30.0 - 36.0 g/dL   RDW 25.9 (H) 56.3 - 87.5 %   Platelets 361 150 - 400 K/uL   nRBC 0.0 0.0 - 0.2 %  hCG, quantitative, pregnancy     Status: Abnormal   Collection Time: 11/29/22  6:19 PM  Result Value Ref Range   hCG, Beta Chain, Quant, S 86,650 (H) <5 mIU/mL  Wet prep, genital     Status: Abnormal   Collection Time:  11/29/22  6:41 PM   Specimen: PATH Cytology Cervicovaginal Ancillary Only  Result Value Ref Range   Yeast Wet Prep HPF POC NONE SEEN NONE SEEN   Trich, Wet Prep NONE SEEN NONE SEEN   Clue Cells Wet Prep HPF POC PRESENT (A) NONE SEEN   WBC, Wet Prep HPF POC >=10 (A) <10   Sperm NONE SEEN   CBC     Status: Abnormal   Collection Time: 11/30/22 12:01 AM  Result Value Ref Range   WBC 12.4 (H) 4.0 - 10.5 K/uL   RBC 4.01 3.87 - 5.11 MIL/uL   Hemoglobin 10.0 (L) 12.0 - 15.0 g/dL   HCT 45.4 (L) 09.8 - 11.9 %   MCV 78.6 (L) 80.0 - 100.0 fL   MCH 24.9 (L) 26.0 - 34.0 pg   MCHC 31.7 30.0 -  36.0 g/dL   RDW 14.7 (H) 82.9 - 56.2 %   Platelets 358 150 - 400 K/uL   nRBC 0.0 0.0 - 0.2 %   MR Abdomen W or Wo Contrast  Result Date: 11/30/2022 CLINICAL DATA:  36 year old pregnant female with history of acute onset of nonlocalized abdominal pain in the lower abdomen. EXAM: MRI ABDOMEN WITHOUT AND WITH CONTRAST TECHNIQUE: Multiplanar multisequence MR imaging of the abdomen was performed both before and after the administration of intravenous contrast. CONTRAST:  10mL GADAVIST GADOBUTROL 1 MMOL/ML IV SOLN COMPARISON:  No prior abdominal MRI.  OB ultrasound 11/29/2022. FINDINGS: Lower chest: Unremarkable. Hepatobiliary: No suspicious cystic or solid hepatic lesions. No intra or extrahepatic biliary ductal dilatation. Gallbladder contains some amorphous material which is T1 hyperintense, likely biliary sludge. Gallbladder is moderately distended. Gallbladder wall appears normal in thickness. No pericholecystic fluid. Pancreas: No pancreatic mass. No pancreatic ductal dilatation. No pancreatic or peripancreatic fluid collections or inflammatory changes. Spleen:  Unremarkable. Adrenals/Urinary Tract: Bilateral kidneys and adrenal glands are unremarkable in appearance. No hydroureteronephrosis. Urinary bladder is grossly unremarkable in appearance. Stomach/Bowel: Stomach is unremarkable in appearance. No pathologic dilatation of small bowel or colon. Normal appendix. Vascular/Lymphatic: No significant atherosclerotic disease, or aneurysm identified in the abdominal vasculature. No lymphadenopathy noted in the abdomen. Other: Trace volume of free fluid, most evident in the low anatomic pelvis. Incidental imaging of the pelvis demonstrates an IUP in the endometrial canal. A C-section scar is noted in the anterior aspect of the lower uterine segment. There does appear to be myometrium between the gestational sac and the adjacent urinary bladder. Fat containing periumbilical ventral hernia. Left ovary is not  confidently identified. Multiple T2 hyperintense lesions in the right ovary which is located anteriorly adjacent to the uterine fundus, largest of which measures up to 3.8 cm in diameter. Musculoskeletal: Visualized portions are unremarkable. IMPRESSION: 1. Small volume of ascites. No other findings in the abdomen to account for the patient's symptoms. 2. Single IUP with gestational sac in the endometrial canal. There appears to be myometrium between the gestational sac and the urinary bladder. Despite the proximity of the gestational sac to the patient's C-section scar, this does not appear to represent a frank C-section scar ectopic. 3. Multiple follicles and small cysts in the right ovary. 4. Small volume of free fluid in the pelvis, most evident in the cul-de-sac. 5. Periumbilical ventral hernia containing omental fat. Electronically Signed   By: Trudie Reed M.D.   On: 11/30/2022 05:48   US APPENDIX (ABDOMEN LIMITED)  Result Date: 11/29/2022 CLINICAL DATA:  Pregnant, pelvic pain EXAM: ULTRASOUND ABDOMEN LIMITED TECHNIQUE: Wallace Cullens scale imaging of the right  lower quadrant was performed to evaluate for suspected appendicitis. Standard imaging planes and graded compression technique were utilized. COMPARISON:  None Available. FINDINGS: The appendix is not visualized. Ancillary findings: Small free intraperitoneal fluid is seen within the right lower quadrant. By report of the technologist, the patient was tender to application of transducer pressure in this region. Factors affecting image quality: None. Other findings: None. IMPRESSION: 1. Non visualization of the appendix. Non-visualization of appendix by US does not definitely exclude appendicitis. If there is sufficient clinical concern, consider abdomen pelvis CT or MRI for further evaluation. 2. Small free intraperitoneal fluid within the right lower quadrant. Electronically Signed   By: Helyn NumbersAshesh  Parikh M.D.   On: 11/29/2022 21:15   US RENAL  Result  Date: 11/29/2022 CLINICAL DATA:  Back pain, pregnant EXAM: RENAL / URINARY TRACT ULTRASOUND COMPLETE COMPARISON:  None Available. FINDINGS: Right Kidney: Renal measurements: 11.8 x 4.8 x 5.9 cm = volume: 175 mL. Echogenicity within normal limits. No mass or hydronephrosis visualized. Left Kidney: Renal measurements: 12.6 x 5.2 x 4.6 cm = volume: 157 mL. The lower pole the left kidney is partially obscured by overlying bowel gas. Echogenicity within normal limits. No mass or hydronephrosis visualized. Bladder: Appears normal for degree of bladder distention. Bilateral ureteral jets identified. Other: Small amount of free fluid is seen within the right upper quadrant within Morison's pouch. IMPRESSION: 1. Normal renal sonogram. 2. Small amount of free fluid within Morison's pouch. Electronically Signed   By: Helyn NumbersAshesh  Parikh M.D.   On: 11/29/2022 21:11   US OB LESS THAN 14 WEEKS WITH OB TRANSVAGINAL  Result Date: 11/29/2022 CLINICAL DATA:  Pregnant, pelvic pain EXAM: OBSTETRIC <14 WK US AND TRANSVAGINAL OB US TECHNIQUE: Both transabdominal and transvaginal ultrasound examinations were performed for complete evaluation of the gestation as well as the maternal uterus, adnexal regions, and pelvic cul-de-sac. Transvaginal technique was performed to assess early pregnancy. COMPARISON:  None Available. FINDINGS: Intrauterine gestational sac: Present. The gestational sac, however, appears positioned within the lower uterine segment immediately abuts the Caesarean section scar within the anterior LUS. Additionally, there is thinning of the myometrium in this region measuring up to 9 mm in minimal diameter. Yolk sac:  Visualized. Embryo:  Visualized. Cardiac Activity: Visualized. Heart Rate: 173 bpm MSD: Appropriate given fetal size CRL:  20 mm   8 w   4 d                  US EDC: Not applicable Subchorionic hemorrhage:  None visualized. Maternal uterus/adnexae: The uterus is anteverted. The cervix is closed and is  unremarkable. As noted above, the intrauterine gestational sac appears positioned within the lower uterine segment abutting the Caesarean section scar. Additionally, best appreciated on cine sequence # 5, there is a moderate amount of echogenic fluid within the endometrial cavity within the fundus most in keeping with intracavitary blood. Additionally, there is echogenic fluid within the pelvis anterior to the lower uterine segment, best seen on image # 46 and # 54. There is moderate echogenic fluid within the left adnexa as well likely representing blood. The myometrial echotexture is diffusely coarsened likely related to underlying uterine fibroids. The left ovary is not clearly identified. The right ovary contains multiple follicles but is otherwise unremarkable. IMPRESSION: Ectopically positioned intrauterine gestational sac abutting the Caesarean section scar most in keeping with a scar ectopic. Echogenic fluid noted within the endometrial cavity as well as external to the uterus within the pelvis anterior to the lower  uterine segment and within the left adnexa likely representing hemorrhagic fluid. Together, the findings raise the question of uterine rupture in the setting of a Caesarean section scar ectopic pregnancy. Nonvisualization of the left ovary. Coarsened myometrial echotexture possibly related to underlying uterine fibroids. These results were called by telephone at the time of interpretation on 11/29/2022 at 8:24 pm to provider [facility] , who verbally acknowledged these results. Electronically Signed   By: Helyn Numbers M.D.   On: 11/29/2022 21:10    MDM - UA positive for Nitrates. UTI present reflex to culture.  - Wet prep positive for Clue Cells- BV  - Hbg and Platelet count normal for pregnancy. Patient hemodynamically stable.  - White Count Normal  - [place] results revealed High Suspicion for Ectopic present in the Previous C/S Scar with possible uterine rupture as evidenced by free  fluid in the pelvis.   - Dr. Shawnie Pons Notified of Radiology findings. Per MD send patient for MRI for further imaging to R/o Kidney stones and imaging of the pregnancy.  - IV Dilaudid and Zofran ordered for Pain and nausea with improvement.     - MRI ordered.    - @ 2200 Report given to D. Lodema Hong, CNM  Transfer of care.  -Shantonette Danella Deis) Suzie Portela, MSN, CNM  [facility]  11/30/22 5:58 AM   - Patient given IV dilaudid at 0315 for increase in pain.   - 0600: MRI results reviewed with Dr. Shawnie Pons. There does not appear to be a c-section scar ectopic. Will hold MTX at this time. She will be admitted to [facility] for further MD management and workup.   Assessment and Plan  Other ectopic pregnancy without intrauterine pregnancy  Previous cesarean section  BV (bacterial vaginosis)  BMI 40.0-44.9, adult (HCC)  History of pulmonary embolism  - Admit to [facility] - Orders placed by Dr. Rozanna Boer, CNM 11/30/22 6:29 AM

## 2022-11-30 DIAGNOSIS — O009 Unspecified ectopic pregnancy without intrauterine pregnancy: Secondary | ICD-10-CM | POA: Diagnosis present

## 2022-11-30 DIAGNOSIS — R109 Unspecified abdominal pain: Secondary | ICD-10-CM

## 2022-11-30 DIAGNOSIS — Z880 Allergy status to penicillin: Secondary | ICD-10-CM | POA: Diagnosis not present

## 2022-11-30 DIAGNOSIS — O008 Other ectopic pregnancy without intrauterine pregnancy: Secondary | ICD-10-CM

## 2022-11-30 DIAGNOSIS — O26891 Other specified pregnancy related conditions, first trimester: Principal | ICD-10-CM

## 2022-11-30 DIAGNOSIS — R1031 Right lower quadrant pain: Secondary | ICD-10-CM | POA: Diagnosis present

## 2022-11-30 DIAGNOSIS — O0991 Supervision of high risk pregnancy, unspecified, first trimester: Secondary | ICD-10-CM | POA: Diagnosis present

## 2022-11-30 DIAGNOSIS — O23591 Infection of other part of genital tract in pregnancy, first trimester: Secondary | ICD-10-CM | POA: Diagnosis present

## 2022-11-30 DIAGNOSIS — Z3A08 8 weeks gestation of pregnancy: Secondary | ICD-10-CM

## 2022-11-30 DIAGNOSIS — N760 Acute vaginitis: Secondary | ICD-10-CM | POA: Diagnosis present

## 2022-11-30 DIAGNOSIS — Z87891 Personal history of nicotine dependence: Secondary | ICD-10-CM | POA: Diagnosis not present

## 2022-11-30 DIAGNOSIS — Z86711 Personal history of pulmonary embolism: Secondary | ICD-10-CM | POA: Diagnosis not present

## 2022-11-30 LAB — COMPREHENSIVE METABOLIC PANEL
ALT: 13 U/L (ref 0–44)
AST: 18 U/L (ref 15–41)
Albumin: 2.8 g/dL — ABNORMAL LOW (ref 3.5–5.0)
Alkaline Phosphatase: 52 U/L (ref 38–126)
Anion gap: 10 (ref 5–15)
BUN: 6 mg/dL (ref 6–20)
CO2: 23 mmol/L (ref 22–32)
Calcium: 8.9 mg/dL (ref 8.9–10.3)
Chloride: 103 mmol/L (ref 98–111)
Creatinine, Ser: 0.73 mg/dL (ref 0.44–1.00)
GFR, Estimated: >60 mL/min (ref >60)
Glucose, Bld: 110 mg/dL — ABNORMAL HIGH (ref 70–99)
Potassium: 3.6 mmol/L (ref 3.5–5.1)
Sodium: 136 mmol/L (ref 135–145)
Total Bilirubin: 0.3 mg/dL (ref 0.3–1.2)
Total Protein: 6.6 g/dL (ref 6.5–8.1)

## 2022-11-30 LAB — CBC
HCT: 31.5 % — ABNORMAL LOW (ref 36.0–46.0)
Hemoglobin: 10 g/dL — ABNORMAL LOW (ref 12.0–15.0)
MCH: 24.9 pg — ABNORMAL LOW (ref 26.0–34.0)
MCHC: 31.7 g/dL (ref 30.0–36.0)
MCV: 78.6 fL — ABNORMAL LOW (ref 80.0–100.0)
Platelets: 358 10*3/uL (ref 150–400)
RBC: 4.01 MIL/uL (ref 3.87–5.11)
RDW: 18.9 % — ABNORMAL HIGH (ref 11.5–15.5)
WBC: 12.4 10*3/uL — ABNORMAL HIGH (ref 4.0–10.5)
nRBC: 0 % (ref 0.0–0.2)

## 2022-11-30 LAB — CBC WITH DIFFERENTIAL/PLATELET
Abs Immature Granulocytes: 0.03 10*3/uL (ref 0.00–0.07)
Basophils Absolute: 0 10*3/uL (ref 0.0–0.1)
Basophils Relative: 0 %
Eosinophils Absolute: 0.1 10*3/uL (ref 0.0–0.5)
Eosinophils Relative: 1 %
HCT: 31.1 % — ABNORMAL LOW (ref 36.0–46.0)
Hemoglobin: 9.7 g/dL — ABNORMAL LOW (ref 12.0–15.0)
Immature Granulocytes: 0 %
Lymphocytes Relative: 19 %
Lymphs Abs: 1.8 10*3/uL (ref 0.7–4.0)
MCH: 24.6 pg — ABNORMAL LOW (ref 26.0–34.0)
MCHC: 31.2 g/dL (ref 30.0–36.0)
MCV: 78.9 fL — ABNORMAL LOW (ref 80.0–100.0)
Monocytes Absolute: 0.8 10*3/uL (ref 0.1–1.0)
Monocytes Relative: 8 %
Neutro Abs: 7 10*3/uL (ref 1.7–7.7)
Neutrophils Relative %: 72 %
Platelets: 335 10*3/uL (ref 150–400)
RBC: 3.94 MIL/uL (ref 3.87–5.11)
RDW: 19 % — ABNORMAL HIGH (ref 11.5–15.5)
WBC: 9.7 10*3/uL (ref 4.0–10.5)
nRBC: 0 % (ref 0.0–0.2)

## 2022-11-30 MED ORDER — ONDANSETRON HCL 4 MG PO TABS
4.0000 mg | ORAL_TABLET | Freq: Four times a day (QID) | ORAL | Status: DC | PRN
  Administered 2022-12-01: 4 mg via ORAL
  Filled 2022-11-30: qty 1

## 2022-11-30 MED ORDER — PROMETHAZINE HCL 25 MG RE SUPP: 25.0000 mg | Freq: Four times a day (QID) | RECTAL | Status: DC | PRN

## 2022-11-30 MED ORDER — LACTATED RINGERS IV SOLN
INTRAVENOUS | Status: DC
  Administered 2022-11-30: 1000 mL via INTRAVENOUS

## 2022-11-30 MED ORDER — PROMETHAZINE HCL 25 MG PO TABS
25.0000 mg | ORAL_TABLET | Freq: Four times a day (QID) | ORAL | Status: DC | PRN
  Administered 2022-11-30 – 2022-12-01 (×3): 25 mg via ORAL
  Filled 2022-11-30 (×3): qty 1

## 2022-11-30 MED ORDER — SIMETHICONE 80 MG PO CHEW: 80.0000 mg | CHEWABLE_TABLET | Freq: Four times a day (QID) | ORAL | Status: DC | PRN

## 2022-11-30 MED ORDER — ONDANSETRON HCL 4 MG/2ML IJ SOLN
4.0000 mg | Freq: Four times a day (QID) | INTRAMUSCULAR | Status: DC | PRN
  Administered 2022-11-30 (×3): 4 mg via INTRAVENOUS
  Filled 2022-11-30 (×4): qty 2

## 2022-11-30 MED ORDER — POLYETHYLENE GLYCOL 3350 17 G PO PACK
17.0000 g | PACK | Freq: Every day | ORAL | Status: DC
  Administered 2022-11-30 – 2022-12-01 (×2): 17 g via ORAL
  Filled 2022-11-30 (×2): qty 1

## 2022-11-30 MED ORDER — HYDROMORPHONE HCL 1 MG/ML IJ SOLN
0.2000 mg | INTRAMUSCULAR | Status: DC | PRN
  Administered 2022-11-30 (×4): 0.6 mg via INTRAVENOUS
  Filled 2022-11-30 (×4): qty 1

## 2022-11-30 MED ORDER — OXYCODONE HCL 5 MG PO TABS
5.0000 mg | ORAL_TABLET | ORAL | Status: DC | PRN
  Administered 2022-11-30 – 2022-12-01 (×3): 10 mg via ORAL
  Filled 2022-11-30 (×3): qty 2

## 2022-11-30 MED ORDER — ACETAMINOPHEN 500 MG PO TABS
1000.0000 mg | ORAL_TABLET | Freq: Four times a day (QID) | ORAL | Status: DC | PRN
  Administered 2022-12-01: 1000 mg via ORAL
  Filled 2022-11-30: qty 2

## 2022-11-30 MED ORDER — SODIUM CHLORIDE 0.9 % IV SOLN: 25.0000 mg | Freq: Four times a day (QID) | INTRAVENOUS | Status: DC | PRN

## 2022-11-30 MED ORDER — METHOTREXATE FOR ECTOPIC PREGNANCY: 50.0000 mg/m2 | Freq: Once | INTRAMUSCULAR | Status: DC

## 2022-11-30 NOTE — MAU Note (Signed)
Report given to [facility] RN-Danielle Simpson CNM in to speak with patient prior to transfer to review plan of care. Pt states pain medication and antemetic effective. Pain at 2 on 0-10 scale

## 2022-11-30 NOTE — MAU Note (Signed)
IV restarted-Dilaudid given per pt request. Transport arrived-pt transferred to Radiology for additional scans.

## 2022-11-30 NOTE — Progress Notes (Signed)
FACULTY PRACTICE ANTEPARTUM COMPREHENSIVE PROGRESS NOTE  Anne Richardson is a 36 y.o. [phone] at [redacted]w[redacted]d who is admitted for pain, nausea/vomiting diagnosed with possible c-section scar ectopic.  Estimated Date of Delivery: 07/10/23   Length of Stay:  0 Days. Admitted 11/29/2022  Subjective: Her pain is well under control today on IV Pain medication. She has nausea but not unusual for her during her pregnancy and she feels ready to eat. Denies vaginal bleeding.    Vitals:  Blood pressure (!) 95/41, pulse 73, temperature 97.8 F (36.6 C), temperature source Oral, resp. rate 16, height 5\' 6"  (1.676 m), weight 119.3 kg, last menstrual period 10/03/2022, SpO2 97 %, unknown if currently breastfeeding. Physical Examination: CONSTITUTIONAL: Well-developed, well-nourished female in no acute distress.  NEUROLOGIC: Alert and oriented to person, place, and time. No cranial nerve deficit noted. PSYCHIATRIC: Normal mood and affect. Normal behavior. Normal judgment and thought content. CARDIOVASCULAR: Normal heart rate noted, regular rhythm RESPIRATORY: Effort and breath sounds normal, no problems with respiration noted MUSCULOSKELETAL: Normal range of motion. No edema and no tenderness. 2+ distal pulses. ABDOMEN: Soft, nontender, nondistended, gravid. CERVIX:  Not examined  Results for orders placed or performed during the hospital encounter of 11/29/22 (from the past 48 hour(s))  Pregnancy, urine POC     Status: Abnormal   Collection Time: 11/29/22  5:59 PM  Result Value Ref Range   Preg Test, Ur POSITIVE (A) NEGATIVE    Comment:        THE SENSITIVITY OF THIS METHODOLOGY IS >24 mIU/mL   Urinalysis, Routine w reflex microscopic     Status: Abnormal   Collection Time: 11/29/22  6:01 PM  Result Value Ref Range   Color, Urine YELLOW YELLOW   APPearance CLOUDY (A) CLEAR   Specific Gravity, Urine 1.023 1.005 - 1.030   pH 7.0 5.0 - 8.0   Glucose, UA NEGATIVE NEGATIVE mg/dL   Hgb urine dipstick  NEGATIVE NEGATIVE   Bilirubin Urine NEGATIVE NEGATIVE   Ketones, ur NEGATIVE NEGATIVE mg/dL   Protein, ur 30 (A) NEGATIVE mg/dL   Nitrite POSITIVE (A) NEGATIVE   Leukocytes,Ua TRACE (A) NEGATIVE   RBC / HPF 0-5 0 - 5 RBC/hpf   WBC, UA 6-10 0 - 5 WBC/hpf   Bacteria, UA MANY (A) NONE SEEN   Squamous Epithelial / LPF 6-10 0 - 5   Mucus PRESENT     Comment: Performed at [facility], [address], [place], [place]  ABO/Rh     Status: None   Collection Time: 11/29/22  6:18 PM  Result Value Ref Range   ABO/RH(D) B POS    No rh immune globuloin      NOT A RH IMMUNE GLOBULIN CANDIDATE, PT RH POSITIVE Performed at [facility], [address] [place], [place]   CBC     Status: Abnormal   Collection Time: 11/29/22  6:19 PM  Result Value Ref Range   WBC 10.0 4.0 - 10.5 K/uL   RBC 4.34 3.87 - 5.11 MIL/uL   Hemoglobin 11.0 (L) 12.0 - 15.0 g/dL   HCT 14/10/23 (L) 35.0 - 09.3 %   MCV 77.9 (L) 80.0 - 100.0 fL   MCH 25.3 (L) 26.0 - 34.0 pg   MCHC 32.5 30.0 - 36.0 g/dL   RDW 81.8 (H) 29.9 - 37.1 %   Platelets 361 150 - 400 K/uL   nRBC 0.0 0.0 - 0.2 %    Comment: Performed at [facility], [address] [place], [place] [address]  hCG, quantitative, pregnancy     Status: Abnormal   Collection Time: 11/29/22  6:19 PM  Result Value Ref Range   hCG, Beta Chain, Quant, S 86,650 (H) <5 mIU/mL    Comment:          GEST. AGE      CONC.  (mIU/mL)   <=1 WEEK        5 - 50     2 WEEKS       50 - 500     3 WEEKS       100 - 10,000     4 WEEKS     1,000 - 30,000     5 WEEKS     3,500 - 115,000   6-8 WEEKS     12,000 - 270,000    12 WEEKS     15,000 - 220,000        FEMALE AND NON-PREGNANT FEMALE:     LESS THAN 5 mIU/mL Performed at [facility], [address] [place], [place] [address]   Wet prep, genital     Status: Abnormal   Collection Time: 11/29/22  6:41 PM   Specimen: PATH Cytology Cervicovaginal Ancillary Only  Result Value Ref Range    Yeast Wet Prep HPF POC NONE SEEN NONE SEEN   Trich, Wet Prep NONE SEEN NONE SEEN   Clue Cells Wet Prep HPF POC PRESENT (A) NONE SEEN   WBC, Wet Prep HPF POC >=10 (A) <10   Sperm NONE SEEN     Comment: Performed at [facility], [address] [place], [place] [address]  CBC     Status: Abnormal   Collection Time: 11/30/22 12:01 AM  Result Value Ref Range   WBC 12.4 (H) 4.0 - 10.5 K/uL   RBC 4.01 3.87 - 5.11 MIL/uL   Hemoglobin 10.0 (L) 12.0 - 15.0 g/dL   HCT 57.831.5 (L) 46.936.0 - 62.946.0 %   MCV 78.6 (L) 80.0 - 100.0 fL   MCH 24.9 (L) 26.0 - 34.0 pg   MCHC 31.7 30.0 - 36.0 g/dL   RDW 52.818.9 (H) 41.311.5 - 24.415.5 %   Platelets 358 150 - 400 K/uL   nRBC 0.0 0.0 - 0.2 %    Comment: Performed at [facility], [address] [place], [place] [address]  Comprehensive metabolic panel     Status: Abnormal   Collection Time: 11/30/22  5:38 AM  Result Value Ref Range   Sodium 136 135 - 145 mmol/L   Potassium 3.6 3.5 - 5.1 mmol/L   Chloride 103 98 - 111 mmol/L   CO2 23 22 - 32 mmol/L   Glucose, Bld 110 (H) 70 - 99 mg/dL    Comment: Glucose reference range applies only to samples taken after fasting for at least 8 hours.   BUN 6 6 - 20 mg/dL   Creatinine, Ser 2.530.73 0.44 - 1.00 mg/dL   Calcium 8.9 8.9 - 66.410.3 mg/dL   Total Protein 6.6 6.5 - 8.1 g/dL   Albumin 2.8 (L) 3.5 - 5.0 g/dL   AST 18 15 - 41 U/L   ALT 13 0 - 44 U/L   Alkaline Phosphatase 52 38 - 126 U/L   Total Bilirubin 0.3 0.3 - 1.2 mg/dL   GFR, Estimated >40>60 >34>60 mL/min    Comment: (NOTE) Calculated using the CKD-EPI Creatinine Equation (2021)    Anion gap 10 5 -  15    Comment: Performed at [facility], [address], [place], [place] [address]  CBC with Differential/Platelet     Status: Abnormal   Collection Time: 11/30/22  7:56 AM  Result Value Ref Range   WBC 9.7 4.0 - 10.5 K/uL   RBC 3.94 3.87 - 5.11 MIL/uL   Hemoglobin 9.7 (L) 12.0 - 15.0 g/dL   HCT 84.1 (L) 32.4 - 40.1 %   MCV 78.9 (L) 80.0 - 100.0 fL   MCH  24.6 (L) 26.0 - 34.0 pg   MCHC 31.2 30.0 - 36.0 g/dL   RDW 02.7 (H) 25.3 - 66.4 %   Platelets 335 150 - 400 K/uL   nRBC 0.0 0.0 - 0.2 %   Neutrophils Relative % 72 %   Neutro Abs 7.0 1.7 - 7.7 K/uL   Lymphocytes Relative 19 %   Lymphs Abs 1.8 0.7 - 4.0 K/uL   Monocytes Relative 8 %   Monocytes Absolute 0.8 0.1 - 1.0 K/uL   Eosinophils Relative 1 %   Eosinophils Absolute 0.1 0.0 - 0.5 K/uL   Basophils Relative 0 %   Basophils Absolute 0.0 0.0 - 0.1 K/uL   Immature Granulocytes 0 %   Abs Immature Granulocytes 0.03 0.00 - 0.07 K/uL    Comment: Performed at [facility], [address] [place], [place] [address]    MR Abdomen W or Wo Contrast  Result Date: 11/30/2022 CLINICAL DATA:  36 year old pregnant female with history of acute onset of nonlocalized abdominal pain in the lower abdomen. EXAM: MRI ABDOMEN WITHOUT AND WITH CONTRAST TECHNIQUE: Multiplanar multisequence MR imaging of the abdomen was performed both before and after the administration of intravenous contrast. CONTRAST:  10mL GADAVIST GADOBUTROL 1 MMOL/ML IV SOLN COMPARISON:  No prior abdominal MRI.  OB ultrasound 11/29/2022. FINDINGS: Lower chest: Unremarkable. Hepatobiliary: No suspicious cystic or solid hepatic lesions. No intra or extrahepatic biliary ductal dilatation. Gallbladder contains some amorphous material which is T1 hyperintense, likely biliary sludge. Gallbladder is moderately distended. Gallbladder wall appears normal in thickness. No pericholecystic fluid. Pancreas: No pancreatic mass. No pancreatic ductal dilatation. No pancreatic or peripancreatic fluid collections or inflammatory changes. Spleen:  Unremarkable. Adrenals/Urinary Tract: Bilateral kidneys and adrenal glands are unremarkable in appearance. No hydroureteronephrosis. Urinary bladder is grossly unremarkable in appearance. Stomach/Bowel: Stomach is unremarkable in appearance. No pathologic dilatation of small bowel or colon. Normal appendix.  Vascular/Lymphatic: No significant atherosclerotic disease, or aneurysm identified in the abdominal vasculature. No lymphadenopathy noted in the abdomen. Other: Trace volume of free fluid, most evident in the low anatomic pelvis. Incidental imaging of the pelvis demonstrates an IUP in the endometrial canal. A C-section scar is noted in the anterior aspect of the lower uterine segment. There does appear to be myometrium between the gestational sac and the adjacent urinary bladder. Fat containing periumbilical ventral hernia. Left ovary is not confidently identified. Multiple T2 hyperintense lesions in the right ovary which is located anteriorly adjacent to the uterine fundus, largest of which measures up to 3.8 cm in diameter. Musculoskeletal: Visualized portions are unremarkable. IMPRESSION: 1. Small volume of ascites. No other findings in the abdomen to account for the patient's symptoms. 2. Single IUP with gestational sac in the endometrial canal. There appears to be myometrium between the gestational sac and the urinary bladder. Despite the proximity of the gestational sac to the patient's C-section scar, this does not appear to represent a frank C-section scar ectopic. 3. Multiple follicles and small cysts in  the right ovary. 4. Small volume of free fluid in the pelvis, most evident in the cul-de-sac. 5. Periumbilical ventral hernia containing omental fat. Electronically Signed   By: Trudie Reed M.D.   On: 11/30/2022 05:48   US APPENDIX (ABDOMEN LIMITED)  Result Date: 11/29/2022 CLINICAL DATA:  Pregnant, pelvic pain EXAM: ULTRASOUND ABDOMEN LIMITED TECHNIQUE: Wallace Cullens scale imaging of the right lower quadrant was performed to evaluate for suspected appendicitis. Standard imaging planes and graded compression technique were utilized. COMPARISON:  None Available. FINDINGS: The appendix is not visualized. Ancillary findings: Small free intraperitoneal fluid is seen within the right lower quadrant. By report  of the technologist, the patient was tender to application of transducer pressure in this region. Factors affecting image quality: None. Other findings: None. IMPRESSION: 1. Non visualization of the appendix. Non-visualization of appendix by [place] does not definitely exclude appendicitis. If there is sufficient clinical concern, consider abdomen pelvis CT or MRI for further evaluation. 2. Small free intraperitoneal fluid within the right lower quadrant. Electronically Signed   By: Helyn Numbers M.D.   On: 11/29/2022 21:15   US RENAL  Result Date: 11/29/2022 CLINICAL DATA:  Back pain, pregnant EXAM: RENAL / URINARY TRACT ULTRASOUND COMPLETE COMPARISON:  None Available. FINDINGS: Right Kidney: Renal measurements: 11.8 x 4.8 x 5.9 cm = volume: 175 mL. Echogenicity within normal limits. No mass or hydronephrosis visualized. Left Kidney: Renal measurements: 12.6 x 5.2 x 4.6 cm = volume: 157 mL. The lower pole the left kidney is partially obscured by overlying bowel gas. Echogenicity within normal limits. No mass or hydronephrosis visualized. Bladder: Appears normal for degree of bladder distention. Bilateral ureteral jets identified. Other: Small amount of free fluid is seen within the right upper quadrant within Morison's pouch. IMPRESSION: 1. Normal renal sonogram. 2. Small amount of free fluid within Morison's pouch. Electronically Signed   By: Helyn Numbers M.D.   On: 11/29/2022 21:11   US OB LESS THAN 14 WEEKS WITH OB TRANSVAGINAL  Result Date: 11/29/2022 CLINICAL DATA:  Pregnant, pelvic pain EXAM: OBSTETRIC <14 WK [place] AND TRANSVAGINAL OB US TECHNIQUE: Both transabdominal and transvaginal ultrasound examinations were performed for complete evaluation of the gestation as well as the maternal uterus, adnexal regions, and pelvic cul-de-sac. Transvaginal technique was performed to assess early pregnancy. COMPARISON:  None Available. FINDINGS: Intrauterine gestational sac: Present. The gestational sac, however,  appears positioned within the lower uterine segment immediately abuts the Caesarean section scar within the anterior LUS. Additionally, there is thinning of the myometrium in this region measuring up to 9 mm in minimal diameter. Yolk sac:  Visualized. Embryo:  Visualized. Cardiac Activity: Visualized. Heart Rate: 173 bpm MSD: Appropriate given fetal size CRL:  20 mm   8 w   4 d                  [place] EDC: Not applicable Subchorionic hemorrhage:  None visualized. Maternal uterus/adnexae: The uterus is anteverted. The cervix is closed and is unremarkable. As noted above, the intrauterine gestational sac appears positioned within the lower uterine segment abutting the Caesarean section scar. Additionally, best appreciated on cine sequence # 5, there is a moderate amount of echogenic fluid within the endometrial cavity within the fundus most in keeping with intracavitary blood. Additionally, there is echogenic fluid within the pelvis anterior to the lower uterine segment, best seen on image # 46 and # 54. There is moderate echogenic fluid within the left adnexa as well likely representing blood. The myometrial echotexture  is diffusely coarsened likely related to underlying uterine fibroids. The left ovary is not clearly identified. The right ovary contains multiple follicles but is otherwise unremarkable. IMPRESSION: Ectopically positioned intrauterine gestational sac abutting the Caesarean section scar most in keeping with a scar ectopic. Echogenic fluid noted within the endometrial cavity as well as external to the uterus within the pelvis anterior to the lower uterine segment and within the left adnexa likely representing hemorrhagic fluid. Together, the findings raise the question of uterine rupture in the setting of a Caesarean section scar ectopic pregnancy. Nonvisualization of the left ovary. Coarsened myometrial echotexture possibly related to underlying uterine fibroids. These results were called by telephone at  the time of interpretation on 11/29/2022 at 8:24 pm to provider [facility] , who verbally acknowledged these results. Electronically Signed   By: Helyn Numbers M.D.   On: 11/29/2022 21:10    Current scheduled medications   I have reviewed the patient's current medications.  ASSESSMENT: Principal Problem:   Ectopic pregnancy Active Problems:   Other ectopic pregnancy without intrauterine pregnancy   PLAN: Intrauterine pregnancy - I independently reviewed the imaging from the [place] and MRI and agree that by [place] it appears more like a c-section scar ectopic but still questionable. The sac appears superior to the scar itself. On MRI, especially in sagittal views, the sac appears above the scar. Blood is evident above the sac, possibly representing a [facility] or some other process. I reviewed the patient with Dr. April Manson who agrees. Ultimately, he would like to perform an ultrasound later this week again in the office but he agrees with expectant management at this time.  - CBC stable from overnight. Will recheck tomorrow. If stable, will d/c home with short follow up on Wednesday or Thursday with Dr. Lyndal Rainbow office.   Pain - Based on the above evaluation, we will advance her to PO medications and will allow her eat regular diet  Nausea/vomiting of pregnancy - Continue antiemetics of Zofran and Phenergan which are helping. Would send home on Unisom and B6 as first line as well.    Milas Hock, MD, FACOG Obstetrician & Gynecologist, [facility] for [facility], [facility]

## 2022-11-30 NOTE — MAU Note (Signed)
Pt returned from MRI. States Dilaudid is effective for abdominal and rectal pain but she continues to have pain and pressure in her vagina when on the toilet to urinate.

## 2022-11-30 NOTE — MAU Note (Signed)
Pt unable to remain still for fetal monitoring. Monitors repositioned frequently CNM notified. In room to adjust monitors

## 2022-11-30 NOTE — H&P (Signed)
Anne Richardson is an 36 y.o. [id number] female.   Chief Complaint: acute RLQ pain HPI: patient has h/o prev. C-section x 4. Arrives via EMS with worsening RLQ pain today. Pain started a few days ago and acutely worsened today. Arrived via EMS and was diaphoretic. Has stable VS. Initial pelvic u/s reveals probable C-section ectopic with free fluid in pelvis. Also with fluid in the endometrium and in the adnexa bilaterally. Has h/o PE. Possible fibroids noted. Pt. Notes abdominal and back pain and flank pain and constipation and vomiting.  Past Medical History:  Diagnosis Date   Anxiety    Chlamydia    Depression    doing good now   Gestational diabetes    Gonorrhea    Insufficient prenatal care in third trimester 07/20/2017   Onset of care at 36w   Pregnancy induced hypertension    Pulmonary embolism [facility](HCC)     Past Surgical History:  Procedure Laterality Date   BIOPSY BREAST     BREAST LUMPECTOMY     left   CESAREAN SECTION     CESAREAN SECTION  06/28/2012   Procedure: CESAREAN SECTION;  Surgeon: Lesly DukesKelly H Leggett, MD;  Location: [facility];  Service: Gynecology;  Laterality: N/A;   CESAREAN SECTION N/A 03/21/2016   Procedure: CESAREAN SECTION;  Surgeon: Lazaro ArmsLuther H Eure, MD;  Location: [facility];  Service: Obstetrics;  Laterality: N/A;   CESAREAN SECTION N/A 07/24/2017   Procedure: CESAREAN SECTION;  Surgeon: Hermina StaggersErvin, Michael L, MD;  [facility];  Service: Obstetrics;  Laterality: N/A;   WISDOM TOOTH EXTRACTION     age 36    Family History  Problem Relation Age of Onset   Cancer Mother 335       breast   Alcohol abuse Mother    Depression Mother    Hypertension Mother    Depression Sister    Hypertension Sister    Asthma Brother    Depression Brother    Hypertension Brother    Alcohol abuse Maternal Aunt    Hypertension Maternal Aunt    Cancer Maternal Aunt [phone]       breast ca   Alcohol abuse Maternal Uncle    Hypertension Maternal Uncle    Heart disease Maternal  Grandmother    Alcohol abuse Maternal Grandmother    Hypertension Maternal Grandmother    Heart disease Maternal Grandfather    Alcohol abuse Maternal Grandfather    Hypertension Maternal Grandfather    Cancer Maternal Grandfather        prostate and bone   Depression Paternal Grandfather    Alzheimer's disease Paternal Grandmother    Diabetes Paternal Grandmother    Social History:  reports that she has quit smoking. Her smoking use included cigarettes and cigars. She has never used smokeless tobacco. She reports that she does not currently use drugs after having used the following drugs: Marijuana. She reports that she does not drink alcohol.  Allergies:  Allergies  Allergen Reactions   Penicillins Itching and Swelling    Has patient had a PCN reaction causing immediate rash, facial/tongue/throat swelling, SOB or lightheadedness with hypotension: No Has patient had a PCN reaction causing severe rash involving mucus membranes or skin necrosis: No Has patient had a PCN reaction that required hospitalization No Has patient had a PCN reaction occurring within the last 10 years: No If all of the above answers are "NO", then may proceed with Cephalosporin use.     Medications Prior to Admission  Medication Sig Dispense Refill   albuterol (VENTOLIN HFA) 108 (90 Base) MCG/ACT inhaler Inhale 2 puffs into the lungs every 6 (six) hours as needed for wheezing or shortness of breath. 8.5 g 3   apixaban (ELIQUIS) 5 MG TABS tablet Take 1 tablet (5 mg total) by mouth 2 (two) times daily. 120 tablet 0   sertraline (ZOLOFT) 50 MG tablet Take 1 tablet (50 mg total) by mouth daily. 30 tablet 3    Pertinent items are noted in HPI.  Blood pressure (!) 102/55, pulse 91, temperature 98 F (36.7 C), temperature source Oral, resp. rate 18, height 5\' 6"  (1.676 m), weight 119.3 kg, last menstrual period 10/03/2022, SpO2 100 %, unknown if currently breastfeeding. BP (!) 102/55 (BP Location: Left Arm)    Pulse 91   Temp 98 F (36.7 C) (Oral)   Resp 18   Ht 5\' 6"  (1.676 m)   Wt 119.3 kg   LMP 10/03/2022 (Exact Date)   SpO2 100%   BMI 42.45 kg/m  General appearance: alert, cooperative, and appears stated age Neck: supple, symmetrical, trachea midline Lungs:  normal effort Heart: regular rate and rhythm Abdomen:  soft, tender to palpation, minimal guarding Extremities: Homans sign is negative, no sign of DVT Skin: Skin color, texture, turgor normal. No rashes or lesions Neurologic: Grossly normal   Results for orders placed or performed during the hospital encounter of 11/29/22 (from the past 24 hour(s))  Pregnancy, urine POC     Status: Abnormal   Collection Time: 11/29/22  5:59 PM  Result Value Ref Range   Preg Test, Ur POSITIVE (A) NEGATIVE  Urinalysis, Routine w reflex microscopic     Status: Abnormal   Collection Time: 11/29/22  6:01 PM  Result Value Ref Range   Color, Urine YELLOW YELLOW   APPearance CLOUDY (A) CLEAR   Specific Gravity, Urine 1.023 1.005 - 1.030   pH 7.0 5.0 - 8.0   Glucose, UA NEGATIVE NEGATIVE mg/dL   Hgb urine dipstick NEGATIVE NEGATIVE   Bilirubin Urine NEGATIVE NEGATIVE   Ketones, ur NEGATIVE NEGATIVE mg/dL   Protein, ur 30 (A) NEGATIVE mg/dL   Nitrite POSITIVE (A) NEGATIVE   Leukocytes,Ua TRACE (A) NEGATIVE   RBC / HPF 0-5 0 - 5 RBC/hpf   WBC, UA 6-10 0 - 5 WBC/hpf   Bacteria, UA MANY (A) NONE SEEN   Squamous Epithelial / LPF 6-10 0 - 5   Mucus PRESENT   ABO/Rh     Status: None   Collection Time: 11/29/22  6:18 PM  Result Value Ref Range   ABO/RH(D) B POS    No rh immune globuloin      NOT A RH IMMUNE GLOBULIN CANDIDATE, PT RH POSITIVE Performed at [facility], [address], [place], [address] [place]   CBC     Status: Abnormal   Collection Time: 11/29/22  6:19 PM  Result Value Ref Range   WBC 10.0 4.0 - 10.5 K/uL   RBC 4.34 3.87 - 5.11 MIL/uL   Hemoglobin 11.0 (L) 12.0 - 15.0 g/dL   HCT 65784 (L) 14/10/23 - 69.6 %   MCV 77.9  (L) 80.0 - 100.0 fL   MCH 25.3 (L) 26.0 - 34.0 pg   MCHC 32.5 30.0 - 36.0 g/dL   RDW 29.5 (H) 28.4 - 13.2 %   Platelets 361 150 - 400 K/uL   nRBC 0.0 0.0 - 0.2 %  hCG, quantitative, pregnancy     Status: Abnormal   Collection  Time: 11/29/22  6:19 PM  Result Value Ref Range   hCG, Beta Chain, Quant, S 86,650 (H) <5 mIU/mL  Wet prep, genital     Status: Abnormal   Collection Time: 11/29/22  6:41 PM   Specimen: PATH Cytology Cervicovaginal Ancillary Only  Result Value Ref Range   Yeast Wet Prep HPF POC NONE SEEN NONE SEEN   Trich, Wet Prep NONE SEEN NONE SEEN   Clue Cells Wet Prep HPF POC PRESENT (A) NONE SEEN   WBC, Wet Prep HPF POC >=10 (A) <10   Sperm NONE SEEN   CBC     Status: Abnormal   Collection Time: 11/30/22 12:01 AM  Result Value Ref Range   WBC 12.4 (H) 4.0 - 10.5 K/uL   RBC 4.01 3.87 - 5.11 MIL/uL   Hemoglobin 10.0 (L) 12.0 - 15.0 g/dL   HCT 35.0 (L) 09.3 - 81.8 %   MCV 78.6 (L) 80.0 - 100.0 fL   MCH 24.9 (L) 26.0 - 34.0 pg   MCHC 31.7 30.0 - 36.0 g/dL   RDW 29.9 (H) 37.1 - 69.6 %   Platelets 358 150 - 400 K/uL   nRBC 0.0 0.0 - 0.2 %   US APPENDIX (ABDOMEN LIMITED)  Result Date: 11/29/2022 CLINICAL DATA:  Pregnant, pelvic pain EXAM: ULTRASOUND ABDOMEN LIMITED TECHNIQUE: Wallace Cullens scale imaging of the right lower quadrant was performed to evaluate for suspected appendicitis. Standard imaging planes and graded compression technique were utilized. COMPARISON:  None Available. FINDINGS: The appendix is not visualized. Ancillary findings: Small free intraperitoneal fluid is seen within the right lower quadrant. By report of the technologist, the patient was tender to application of transducer pressure in this region. Factors affecting image quality: None. Other findings: None. IMPRESSION: 1. Non visualization of the appendix. Non-visualization of appendix by [place] does not definitely exclude appendicitis. If there is sufficient clinical concern, consider abdomen pelvis CT or MRI for  further evaluation. 2. Small free intraperitoneal fluid within the right lower quadrant. Electronically Signed   By: Helyn Numbers M.D.   On: 11/29/2022 21:15   US RENAL  Result Date: 11/29/2022 CLINICAL DATA:  Back pain, pregnant EXAM: RENAL / URINARY TRACT ULTRASOUND COMPLETE COMPARISON:  None Available. FINDINGS: Right Kidney: Renal measurements: 11.8 x 4.8 x 5.9 cm = volume: 175 mL. Echogenicity within normal limits. No mass or hydronephrosis visualized. Left Kidney: Renal measurements: 12.6 x 5.2 x 4.6 cm = volume: 157 mL. The lower pole the left kidney is partially obscured by overlying bowel gas. Echogenicity within normal limits. No mass or hydronephrosis visualized. Bladder: Appears normal for degree of bladder distention. Bilateral ureteral jets identified. Other: Small amount of free fluid is seen within the right upper quadrant within Morison's pouch. IMPRESSION: 1. Normal renal sonogram. 2. Small amount of free fluid within Morison's pouch. Electronically Signed   By: Helyn Numbers M.D.   On: 11/29/2022 21:11   US OB LESS THAN 14 WEEKS WITH OB TRANSVAGINAL  Result Date: 11/29/2022 CLINICAL DATA:  Pregnant, pelvic pain EXAM: OBSTETRIC <14 WK [place] AND TRANSVAGINAL OB US TECHNIQUE: Both transabdominal and transvaginal ultrasound examinations were performed for complete evaluation of the gestation as well as the maternal uterus, adnexal regions, and pelvic cul-de-sac. Transvaginal technique was performed to assess early pregnancy. COMPARISON:  None Available. FINDINGS: Intrauterine gestational sac: Present. The gestational sac, however, appears positioned within the lower uterine segment immediately abuts the Caesarean section scar within the anterior LUS. Additionally, there is thinning of the myometrium in this  region measuring up to 9 mm in minimal diameter. Yolk sac:  Visualized. Embryo:  Visualized. Cardiac Activity: Visualized. Heart Rate: 173 bpm MSD: Appropriate given fetal size CRL:  20  mm   8 w   4 d                  [place] EDC: Not applicable Subchorionic hemorrhage:  None visualized. Maternal uterus/adnexae: The uterus is anteverted. The cervix is closed and is unremarkable. As noted above, the intrauterine gestational sac appears positioned within the lower uterine segment abutting the Caesarean section scar. Additionally, best appreciated on cine sequence # 5, there is a moderate amount of echogenic fluid within the endometrial cavity within the fundus most in keeping with intracavitary blood. Additionally, there is echogenic fluid within the pelvis anterior to the lower uterine segment, best seen on image # 46 and # 54. There is moderate echogenic fluid within the left adnexa as well likely representing blood. The myometrial echotexture is diffusely coarsened likely related to underlying uterine fibroids. The left ovary is not clearly identified. The right ovary contains multiple follicles but is otherwise unremarkable. IMPRESSION: Ectopically positioned intrauterine gestational sac abutting the Caesarean section scar most in keeping with a scar ectopic. Echogenic fluid noted within the endometrial cavity as well as external to the uterus within the pelvis anterior to the lower uterine segment and within the left adnexa likely representing hemorrhagic fluid. Together, the findings raise the question of uterine rupture in the setting of a Caesarean section scar ectopic pregnancy. Nonvisualization of the left ovary. Coarsened myometrial echotexture possibly related to underlying uterine fibroids. These results were called by telephone at the time of interpretation on 11/29/2022 at 8:24 pm to provider [facility] , who verbally acknowledged these results. Electronically Signed   By: Helyn Numbers M.D.   On: 11/29/2022 21:10    Assessment/Plan Other ectopic pregnancy without intrauterine pregnancy  Previous cesarean section  BV (bacterial vaginosis)  BMI 40.0-44.9, adult  (HCC)  History of pulmonary embolism   Case discussed with Fermin Schwab. He recommended MRI and MTX. He also recommended consultation with IR in am for gel foam placement in the Uterine arteries and then suction D & E on Wednesday. MRI results pending. Treat UTI, culture urine Following treatment consider ppx lovenox for 2-4 weeks    Reva Bores 11/30/2022, 1:45 AM

## 2022-12-01 LAB — GC/CHLAMYDIA PROBE AMP (~~LOC~~) NOT AT ARMC
Chlamydia: NEGATIVE
Comment: NEGATIVE
Comment: NORMAL
Neisseria Gonorrhea: NEGATIVE

## 2022-12-01 LAB — CBC
HCT: 28.3 % — ABNORMAL LOW (ref 36.0–46.0)
Hemoglobin: 9.3 g/dL — ABNORMAL LOW (ref 12.0–15.0)
MCH: 25.2 pg — ABNORMAL LOW (ref 26.0–34.0)
MCHC: 32.9 g/dL (ref 30.0–36.0)
MCV: 76.7 fL — ABNORMAL LOW (ref 80.0–100.0)
Platelets: 294 10*3/uL (ref 150–400)
RBC: 3.69 MIL/uL — ABNORMAL LOW (ref 3.87–5.11)
RDW: 18.6 % — ABNORMAL HIGH (ref 11.5–15.5)
WBC: 7.8 10*3/uL (ref 4.0–10.5)
nRBC: 0 % (ref 0.0–0.2)

## 2022-12-01 MED ORDER — OXYCODONE HCL 5 MG PO TABS: 10.0000 mg | ORAL_TABLET | ORAL | 0 refills | Status: DC | PRN

## 2022-12-01 MED ORDER — ACETAMINOPHEN 500 MG PO TABS: 1000.0000 mg | ORAL_TABLET | Freq: Four times a day (QID) | ORAL | 0 refills | Status: DC | PRN

## 2022-12-01 MED ORDER — POLYETHYLENE GLYCOL 3350 17 G PO PACK: 17.0000 g | PACK | Freq: Every day | ORAL | 0 refills | Status: DC

## 2022-12-01 MED ORDER — PROMETHAZINE HCL 25 MG PO TABS: 25.0000 mg | ORAL_TABLET | Freq: Four times a day (QID) | ORAL | 0 refills | Status: DC | PRN

## 2022-12-01 MED ORDER — ONDANSETRON HCL 4 MG PO TABS: 4.0000 mg | ORAL_TABLET | Freq: Four times a day (QID) | ORAL | 0 refills | Status: DC | PRN

## 2022-12-01 NOTE — Discharge Summary (Signed)
Antenatal Physician Discharge Summary  Patient ID: Anne Richardson MRN: [id number] DOB/AGE: 37/02/1986 38 y.o.  Admit date: 11/29/2022 Discharge date: 12/01/2022  Admission Diagnoses: Ectopic pregnancy [O00.90]   Discharge Diagnoses:  Abdominal pain in pregnancy  Prenatal Procedures: ultrasound and MRI  Consults: No formal consults.   Hospital Course:  Anne BlazerChiquita J Connors is a 36 y.o. [phone] with IUP at 5041w3d admitted for abdominal pain and concern for possible c-section scar ectopic. She had an US which showed probably c-section scar ectopic. She had an MRI which showed unlikely ectopic. She has some fluid behind the sac and a small amount in her pelvis through to be blood. We discussed possible cyst rupture versus blood egress outside the tube. Dr. April MansonYalcinkaya was informally consulted and he would like to see the patient on Wednesday or Thursday for further evaluation in his office. [place] from [place] and eventually plans to move back there potentially in January but [facility]. [facility]   She also has h/o PE this year in January. No longer on blood thinner. We discussed holding on anticoagulation until evaluation is complete for the pregnancy in this first trimester but by her first OB visit we will likely recommend starting Lovenox. During her hospital admission her pain was controlled with pain medication and she felt much improved from when she initially came in.   She was deemed stable for discharge to home with outpatient follow up.  Discharge Exam: Temp:  [97.7 F (36.5 C)-98.3 F (36.8 C)] 97.9 F (36.6 C) (12/12 0828) Pulse Rate:  [77-89] 89 (12/12 0828) Resp:  [15-20] 20 (12/12 0828) BP: (100-123)/(53-71) 110/63 (12/12 0828) SpO2:  [96 %-99 %] 99 % (12/12 0828) Physical Examination: CONSTITUTIONAL: Well-developed, well-nourished female in no acute distress.  HENT:  Normocephalic, atraumatic, External right and  left ear normal.  EYES: Conjunctivae and EOM are normal. Pupils are equal, round, and reactive to light. No scleral icterus.  NECK: Normal range of motion, supple, no masses SKIN: Skin is warm and dry. No rash noted. Not diaphoretic. No erythema. No pallor. NEUROLOGIC: Alert and oriented to person, place, and time. Normal reflexes, muscle tone coordination. No cranial nerve deficit noted. PSYCHIATRIC: Normal mood and affect. Normal behavior. Normal judgment and thought content. CARDIOVASCULAR: Normal heart rate noted, regular rhythm RESPIRATORY: Effort and breath sounds normal, no problems with respiration noted MUSCULOSKELETAL: Normal range of motion. No edema and no tenderness. 2+ distal pulses. ABDOMEN: Soft, nontender, nondistended, gravid. CERVIX:  Not examined   Significant Diagnostic Studies:  Results for orders placed or performed during the hospital encounter of 11/29/22 (from the past 168 hour(s))  Pregnancy, urine POC   Collection Time: 11/29/22  5:59 PM  Result Value Ref Range   Preg Test, Ur POSITIVE (A) NEGATIVE  Urinalysis, Routine w reflex microscopic   Collection Time: 11/29/22  6:01 PM  Result Value Ref Range   Color, Urine YELLOW YELLOW   APPearance CLOUDY (A) CLEAR   Specific Gravity, Urine 1.023 1.005 - 1.030   pH 7.0 5.0 - 8.0   Glucose, UA NEGATIVE NEGATIVE mg/dL   Hgb urine dipstick NEGATIVE NEGATIVE   Bilirubin Urine NEGATIVE NEGATIVE   Ketones, ur NEGATIVE NEGATIVE mg/dL   Protein, ur 30 (A) NEGATIVE mg/dL   Nitrite POSITIVE (A) NEGATIVE   Leukocytes,Ua TRACE (A) NEGATIVE   RBC / HPF 0-5 0 - 5 RBC/hpf   WBC, UA 6-10 0 - 5 WBC/hpf   Bacteria, UA  MANY (A) NONE SEEN   Squamous Epithelial / LPF 6-10 0 - 5   Mucus PRESENT   GC/Chlamydia probe amp ([place])not at [facility]   Collection Time: 11/29/22  6:08 PM  Result Value Ref Range   Neisseria Gonorrhea Negative    Chlamydia Negative    Comment Normal Reference Ranger Chlamydia - Negative    Comment       Normal Reference Range Neisseria Gonorrhea - Negative  ABO/Rh   Collection Time: 11/29/22  6:18 PM  Result Value Ref Range   ABO/RH(D) B POS    No rh immune globuloin      NOT A RH IMMUNE GLOBULIN CANDIDATE, PT RH POSITIVE Performed at [facility], [address], [place], [place] [address]   CBC   Collection Time: 11/29/22  6:19 PM  Result Value Ref Range   WBC 10.0 4.0 - 10.5 K/uL   RBC 4.34 3.87 - 5.11 MIL/uL   Hemoglobin 11.0 (L) 12.0 - 15.0 g/dL   HCT 78.2 (L) 95.6 - 21.3 %   MCV 77.9 (L) 80.0 - 100.0 fL   MCH 25.3 (L) 26.0 - 34.0 pg   MCHC 32.5 30.0 - 36.0 g/dL   RDW 08.6 (H) 57.8 - 46.9 %   Platelets 361 150 - 400 K/uL   nRBC 0.0 0.0 - 0.2 %  hCG, quantitative, pregnancy   Collection Time: 11/29/22  6:19 PM  Result Value Ref Range   hCG, Beta Chain, Quant, S 86,650 (H) <5 mIU/mL  Wet prep, genital   Collection Time: 11/29/22  6:41 PM   Specimen: PATH Cytology Cervicovaginal Ancillary Only  Result Value Ref Range   Yeast Wet Prep HPF POC NONE SEEN NONE SEEN   Trich, Wet Prep NONE SEEN NONE SEEN   Clue Cells Wet Prep HPF POC PRESENT (A) NONE SEEN   WBC, Wet Prep HPF POC >=10 (A) <10   Sperm NONE SEEN   CBC   Collection Time: 11/30/22 12:01 AM  Result Value Ref Range   WBC 12.4 (H) 4.0 - 10.5 K/uL   RBC 4.01 3.87 - 5.11 MIL/uL   Hemoglobin 10.0 (L) 12.0 - 15.0 g/dL   HCT 62.9 (L) 52.8 - 41.3 %   MCV 78.6 (L) 80.0 - 100.0 fL   MCH 24.9 (L) 26.0 - 34.0 pg   MCHC 31.7 30.0 - 36.0 g/dL   RDW 24.4 (H) 01.0 - 27.2 %   Platelets 358 150 - 400 K/uL   nRBC 0.0 0.0 - 0.2 %  Comprehensive metabolic panel   Collection Time: 11/30/22  5:38 AM  Result Value Ref Range   Sodium 136 135 - 145 mmol/L   Potassium 3.6 3.5 - 5.1 mmol/L   Chloride 103 98 - 111 mmol/L   CO2 23 22 - 32 mmol/L   Glucose, Bld 110 (H) 70 - 99 mg/dL   BUN 6 6 - 20 mg/dL   Creatinine, Ser 5.36 0.44 - 1.00 mg/dL   Calcium 8.9 8.9 - 64.4 mg/dL   Total Protein 6.6 6.5 - 8.1 g/dL   Albumin  2.8 (L) 3.5 - 5.0 g/dL   AST 18 15 - 41 U/L   ALT 13 0 - 44 U/L   Alkaline Phosphatase 52 38 - 126 U/L   Total Bilirubin 0.3 0.3 - 1.2 mg/dL   GFR, Estimated >03 >47 mL/min   Anion gap 10 5 - 15  CBC with Differential/Platelet   Collection Time: 11/30/22  7:56 AM  Result Value  Ref Range   WBC 9.7 4.0 - 10.5 K/uL   RBC 3.94 3.87 - 5.11 MIL/uL   Hemoglobin 9.7 (L) 12.0 - 15.0 g/dL   HCT 54.9 (L) 82.6 - 41.5 %   MCV 78.9 (L) 80.0 - 100.0 fL   MCH 24.6 (L) 26.0 - 34.0 pg   MCHC 31.2 30.0 - 36.0 g/dL   RDW 83.0 (H) 94.0 - 76.8 %   Platelets 335 150 - 400 K/uL   nRBC 0.0 0.0 - 0.2 %   Neutrophils Relative % 72 %   Neutro Abs 7.0 1.7 - 7.7 K/uL   Lymphocytes Relative 19 %   Lymphs Abs 1.8 0.7 - 4.0 K/uL   Monocytes Relative 8 %   Monocytes Absolute 0.8 0.1 - 1.0 K/uL   Eosinophils Relative 1 %   Eosinophils Absolute 0.1 0.0 - 0.5 K/uL   Basophils Relative 0 %   Basophils Absolute 0.0 0.0 - 0.1 K/uL   Immature Granulocytes 0 %   Abs Immature Granulocytes 0.03 0.00 - 0.07 K/uL  CBC   Collection Time: 12/01/22  4:32 AM  Result Value Ref Range   WBC 7.8 4.0 - 10.5 K/uL   RBC 3.69 (L) 3.87 - 5.11 MIL/uL   Hemoglobin 9.3 (L) 12.0 - 15.0 g/dL   HCT 08.8 (L) 11.0 - 31.5 %   MCV 76.7 (L) 80.0 - 100.0 fL   MCH 25.2 (L) 26.0 - 34.0 pg   MCHC 32.9 30.0 - 36.0 g/dL   RDW 94.5 (H) 85.9 - 29.2 %   Platelets 294 150 - 400 K/uL   nRBC 0.0 0.0 - 0.2 %   MR Abdomen W or Wo Contrast  Result Date: 11/30/2022 CLINICAL DATA:  36 year old pregnant female with history of acute onset of nonlocalized abdominal pain in the lower abdomen. EXAM: MRI ABDOMEN WITHOUT AND WITH CONTRAST TECHNIQUE: Multiplanar multisequence MR imaging of the abdomen was performed both before and after the administration of intravenous contrast. CONTRAST:  36mL GADAVIST GADOBUTROL 1 MMOL/ML IV SOLN COMPARISON:  No prior abdominal MRI.  OB ultrasound 11/29/2022. FINDINGS: Lower chest: Unremarkable. Hepatobiliary: No suspicious  cystic or solid hepatic lesions. No intra or extrahepatic biliary ductal dilatation. Gallbladder contains some amorphous material which is T1 hyperintense, likely biliary sludge. Gallbladder is moderately distended. Gallbladder wall appears normal in thickness. No pericholecystic fluid. Pancreas: No pancreatic mass. No pancreatic ductal dilatation. No pancreatic or peripancreatic fluid collections or inflammatory changes. Spleen:  Unremarkable. Adrenals/Urinary Tract: Bilateral kidneys and adrenal glands are unremarkable in appearance. No hydroureteronephrosis. Urinary bladder is grossly unremarkable in appearance. Stomach/Bowel: Stomach is unremarkable in appearance. No pathologic dilatation of small bowel or colon. Normal appendix. Vascular/Lymphatic: No significant atherosclerotic disease, or aneurysm identified in the abdominal vasculature. No lymphadenopathy noted in the abdomen. Other: Trace volume of free fluid, most evident in the low anatomic pelvis. Incidental imaging of the pelvis demonstrates an IUP in the endometrial canal. A C-section scar is noted in the anterior aspect of the lower uterine segment. There does appear to be myometrium between the gestational sac and the adjacent urinary bladder. Fat containing periumbilical ventral hernia. Left ovary is not confidently identified. Multiple T2 hyperintense lesions in the right ovary which is located anteriorly adjacent to the uterine fundus, largest of which measures up to 3.8 cm in diameter. Musculoskeletal: Visualized portions are unremarkable. IMPRESSION: 1. Small volume of ascites. No other findings in the abdomen to account for the patient's symptoms. 2. Single IUP with gestational sac in the endometrial canal.  There appears to be myometrium between the gestational sac and the urinary bladder. Despite the proximity of the gestational sac to the patient's C-section scar, this does not appear to represent a frank C-section scar ectopic. 3. Multiple  follicles and small cysts in the right ovary. 4. Small volume of free fluid in the pelvis, most evident in the cul-de-sac. 5. Periumbilical ventral hernia containing omental fat. Electronically Signed   By: Trudie Reed M.D.   On: 11/30/2022 05:48   US APPENDIX (ABDOMEN LIMITED)  Result Date: 11/29/2022 CLINICAL DATA:  Pregnant, pelvic pain EXAM: ULTRASOUND ABDOMEN LIMITED TECHNIQUE: Wallace Cullens scale imaging of the right lower quadrant was performed to evaluate for suspected appendicitis. Standard imaging planes and graded compression technique were utilized. COMPARISON:  None Available. FINDINGS: The appendix is not visualized. Ancillary findings: Small free intraperitoneal fluid is seen within the right lower quadrant. By report of the technologist, the patient was tender to application of transducer pressure in this region. Factors affecting image quality: None. Other findings: None. IMPRESSION: 1. Non visualization of the appendix. Non-visualization of appendix by [place] does not definitely exclude appendicitis. If there is sufficient clinical concern, consider abdomen pelvis CT or MRI for further evaluation. 2. Small free intraperitoneal fluid within the right lower quadrant. Electronically Signed   By: Helyn Numbers M.D.   On: 11/29/2022 21:15   US RENAL  Result Date: 11/29/2022 CLINICAL DATA:  Back pain, pregnant EXAM: RENAL / URINARY TRACT ULTRASOUND COMPLETE COMPARISON:  None Available. FINDINGS: Right Kidney: Renal measurements: 11.8 x 4.8 x 5.9 cm = volume: 175 mL. Echogenicity within normal limits. No mass or hydronephrosis visualized. Left Kidney: Renal measurements: 12.6 x 5.2 x 4.6 cm = volume: 157 mL. The lower pole the left kidney is partially obscured by overlying bowel gas. Echogenicity within normal limits. No mass or hydronephrosis visualized. Bladder: Appears normal for degree of bladder distention. Bilateral ureteral jets identified. Other: Small amount of free fluid is seen within the  right upper quadrant within Morison's pouch. IMPRESSION: 1. Normal renal sonogram. 2. Small amount of free fluid within Morison's pouch. Electronically Signed   By: Helyn Numbers M.D.   On: 11/29/2022 21:11   US OB LESS THAN 14 WEEKS WITH OB TRANSVAGINAL  Result Date: 11/29/2022 CLINICAL DATA:  Pregnant, pelvic pain EXAM: OBSTETRIC <14 WK [place] AND TRANSVAGINAL OB US TECHNIQUE: Both transabdominal and transvaginal ultrasound examinations were performed for complete evaluation of the gestation as well as the maternal uterus, adnexal regions, and pelvic cul-de-sac. Transvaginal technique was performed to assess early pregnancy. COMPARISON:  None Available. FINDINGS: Intrauterine gestational sac: Present. The gestational sac, however, appears positioned within the lower uterine segment immediately abuts the Caesarean section scar within the anterior LUS. Additionally, there is thinning of the myometrium in this region measuring up to 9 mm in minimal diameter. Yolk sac:  Visualized. Embryo:  Visualized. Cardiac Activity: Visualized. Heart Rate: 173 bpm MSD: Appropriate given fetal size CRL:  20 mm   8 w   4 d                  [place] EDC: Not applicable Subchorionic hemorrhage:  None visualized. Maternal uterus/adnexae: The uterus is anteverted. The cervix is closed and is unremarkable. As noted above, the intrauterine gestational sac appears positioned within the lower uterine segment abutting the Caesarean section scar. Additionally, best appreciated on cine sequence # 5, there is a moderate amount of echogenic fluid within the endometrial cavity within the fundus most in keeping  with intracavitary blood. Additionally, there is echogenic fluid within the pelvis anterior to the lower uterine segment, best seen on image # 46 and # 54. There is moderate echogenic fluid within the left adnexa as well likely representing blood. The myometrial echotexture is diffusely coarsened likely related to underlying uterine fibroids.  The left ovary is not clearly identified. The right ovary contains multiple follicles but is otherwise unremarkable. IMPRESSION: Ectopically positioned intrauterine gestational sac abutting the Caesarean section scar most in keeping with a scar ectopic. Echogenic fluid noted within the endometrial cavity as well as external to the uterus within the pelvis anterior to the lower uterine segment and within the left adnexa likely representing hemorrhagic fluid. Together, the findings raise the question of uterine rupture in the setting of a Caesarean section scar ectopic pregnancy. Nonvisualization of the left ovary. Coarsened myometrial echotexture possibly related to underlying uterine fibroids. These results were called by telephone at the time of interpretation on 11/29/2022 at 8:24 pm to provider [facility] , who verbally acknowledged these results. Electronically Signed   By: Helyn Numbers M.D.   On: 11/29/2022 21:10    No future appointments.  Discharge Condition: Stable  Discharge disposition: 01-Home or Self Care       Discharge Instructions     Activity as tolerated - No restrictions   Complete by: As directed    Call MD for:   Complete by: As directed    Decreased fetal movement, contractions, and vaginal bleeding.   Call MD for:  difficulty breathing, headache or visual disturbances   Complete by: As directed    Call MD for:  persistant nausea and vomiting   Complete by: As directed    Call MD for:  redness, tenderness, or signs of infection (pain, swelling, redness, odor or green/yellow discharge around incision site)   Complete by: As directed    Call MD for:  severe uncontrolled pain   Complete by: As directed    Call MD for:  temperature >100.4   Complete by: As directed    Diet general   Complete by: As directed    May shower / Bathe   Complete by: As directed       Allergies as of 12/01/2022       Reactions   Penicillins Itching, Swelling   Has patient  had a PCN reaction causing immediate rash, facial/tongue/throat swelling, SOB or lightheadedness with hypotension: No Has patient had a PCN reaction causing severe rash involving mucus membranes or skin necrosis: No Has patient had a PCN reaction that required hospitalization No Has patient had a PCN reaction occurring within the last 10 years: No If all of the above answers are "NO", then may proceed with Cephalosporin use.   Pork-derived Products Other (See Comments)   Religious reasons         Medication List     STOP taking these medications    Eliquis 5 MG Tabs tablet Generic drug: apixaban       TAKE these medications    acetaminophen 500 MG tablet Commonly known as: TYLENOL Take 2 tablets (1,000 mg total) by mouth every 6 (six) hours as needed for mild pain or moderate pain.   albuterol 108 (90 Base) MCG/ACT inhaler Commonly known as: VENTOLIN HFA Inhale 2 puffs into the lungs every 6 (six) hours as needed for wheezing or shortness of breath.   ondansetron 4 MG tablet Commonly known as: ZOFRAN Take 1 tablet (4 mg total) by mouth every  6 (six) hours as needed for nausea.   oxyCODONE 5 MG immediate release tablet Commonly known as: Oxy IR/ROXICODONE Take 2 tablets (10 mg total) by mouth every 3 (three) hours as needed for severe pain or breakthrough pain.   polyethylene glycol 17 g packet Commonly known as: MIRALAX / GLYCOLAX Take 17 g by mouth daily. Start taking on: December 02, 2022   promethazine 25 MG tablet Commonly known as: PHENERGAN Take 1 tablet (25 mg total) by mouth every 6 (six) hours as needed for nausea.   sertraline 50 MG tablet Commonly known as: ZOLOFT Take 1 tablet (50 mg total) by mouth daily.        Follow-up Information     Fermin Schwab, MD Follow up in 2 day(s).   Specialty: Obstetrics and Gynecology Contact information: [address] [place]. [place] [address] [phone]         [facility] Follow up in 4 week(s).   Specialty: Obstetrics and Gynecology Contact information: [address] [place] [address] [phone]                Total discharge time: 45 minutes   Signed: Milas Hock M.D. 12/01/2022, 10:57 AM

## 2022-12-02 ENCOUNTER — Telehealth: Payer: Self-pay

## 2022-12-02 NOTE — Telephone Encounter (Signed)
Pharmacy called office to state cannot fill Oxycodone rx due to high dosage. Report given to Para March MD. Provider will call to verify.

## 2022-12-17 ENCOUNTER — Other Ambulatory Visit: Payer: Self-pay

## 2022-12-17 ENCOUNTER — Encounter (HOSPITAL_COMMUNITY): Payer: Self-pay | Admitting: Obstetrics and Gynecology

## 2022-12-17 ENCOUNTER — Inpatient Hospital Stay (HOSPITAL_COMMUNITY)
Admission: AD | Admit: 2022-12-17 | Discharge: 2022-12-17 | Disposition: A | Discharge status: Home / Self Care | Payer: Medicaid Other | Attending: Obstetrics and Gynecology | Admitting: Obstetrics and Gynecology

## 2022-12-17 DIAGNOSIS — O99511 Diseases of the respiratory system complicating pregnancy, first trimester: Secondary | ICD-10-CM | POA: Insufficient documentation

## 2022-12-17 DIAGNOSIS — Z3A10 10 weeks gestation of pregnancy: Secondary | ICD-10-CM | POA: Diagnosis not present

## 2022-12-17 DIAGNOSIS — J101 Influenza due to other identified influenza virus with other respiratory manifestations: Secondary | ICD-10-CM | POA: Diagnosis not present

## 2022-12-17 DIAGNOSIS — O99341 Other mental disorders complicating pregnancy, first trimester: Secondary | ICD-10-CM | POA: Diagnosis present

## 2022-12-17 DIAGNOSIS — Z1152 Encounter for screening for COVID-19: Secondary | ICD-10-CM | POA: Insufficient documentation

## 2022-12-17 DIAGNOSIS — O26891 Other specified pregnancy related conditions, first trimester: Secondary | ICD-10-CM | POA: Insufficient documentation

## 2022-12-17 LAB — COMPREHENSIVE METABOLIC PANEL
ALT: 12 U/L (ref 0–44)
AST: 18 U/L (ref 15–41)
Albumin: 3 g/dL — ABNORMAL LOW (ref 3.5–5.0)
Alkaline Phosphatase: 53 U/L (ref 38–126)
Anion gap: 9 (ref 5–15)
BUN: 6 mg/dL (ref 6–20)
CO2: 21 mmol/L — ABNORMAL LOW (ref 22–32)
Calcium: 8.7 mg/dL — ABNORMAL LOW (ref 8.9–10.3)
Chloride: 102 mmol/L (ref 98–111)
Creatinine, Ser: 0.77 mg/dL (ref 0.44–1.00)
GFR, Estimated: >60 mL/min (ref >60)
Glucose, Bld: 87 mg/dL (ref 70–99)
Potassium: 3.3 mmol/L — ABNORMAL LOW (ref 3.5–5.1)
Sodium: 132 mmol/L — ABNORMAL LOW (ref 135–145)
Total Bilirubin: 0.5 mg/dL (ref 0.3–1.2)
Total Protein: 7.2 g/dL (ref 6.5–8.1)

## 2022-12-17 LAB — CBC
HCT: 33.9 % — ABNORMAL LOW (ref 36.0–46.0)
Hemoglobin: 11 g/dL — ABNORMAL LOW (ref 12.0–15.0)
MCH: 25.3 pg — ABNORMAL LOW (ref 26.0–34.0)
MCHC: 32.4 g/dL (ref 30.0–36.0)
MCV: 77.9 fL — ABNORMAL LOW (ref 80.0–100.0)
Platelets: 349 10*3/uL (ref 150–400)
RBC: 4.35 MIL/uL (ref 3.87–5.11)
RDW: 18.2 % — ABNORMAL HIGH (ref 11.5–15.5)
WBC: 6.2 10*3/uL (ref 4.0–10.5)
nRBC: 0 % (ref 0.0–0.2)

## 2022-12-17 LAB — RESP PANEL BY RT-PCR (RSV, FLU A&B, COVID)  RVPGX2
Influenza A by PCR: POSITIVE — AB
Influenza B by PCR: NEGATIVE
Resp Syncytial Virus by PCR: NEGATIVE
SARS Coronavirus 2 by RT PCR: NEGATIVE

## 2022-12-17 LAB — URINALYSIS, ROUTINE W REFLEX MICROSCOPIC
Bilirubin Urine: NEGATIVE
Glucose, UA: NEGATIVE mg/dL
Hgb urine dipstick: NEGATIVE
Ketones, ur: NEGATIVE mg/dL
Leukocytes,Ua: NEGATIVE
Nitrite: NEGATIVE
Protein, ur: 30 mg/dL — AB
Specific Gravity, Urine: 1.024 (ref 1.005–1.030)
pH: 5 (ref 5.0–8.0)

## 2022-12-17 MED ORDER — PROMETHAZINE HCL 25 MG RE SUPP: 25.0000 mg | Freq: Four times a day (QID) | RECTAL | 0 refills | Status: DC | PRN

## 2022-12-17 MED ORDER — FAMOTIDINE IN NACL 20-0.9 MG/50ML-% IV SOLN
20.0000 mg | Freq: Once | INTRAVENOUS | Status: AC
  Administered 2022-12-17: 20 mg via INTRAVENOUS
  Filled 2022-12-17: qty 50

## 2022-12-17 MED ORDER — ACETAMINOPHEN 500 MG PO TABS
1000.0000 mg | ORAL_TABLET | Freq: Once | ORAL | Status: AC
  Administered 2022-12-17: 1000 mg via ORAL
  Filled 2022-12-17: qty 2

## 2022-12-17 MED ORDER — SODIUM CHLORIDE 0.9 % IV SOLN
25.0000 mg | Freq: Once | INTRAVENOUS | Status: AC
  Administered 2022-12-17: 25 mg via INTRAVENOUS
  Filled 2022-12-17: qty 1

## 2022-12-17 MED ORDER — GUAIFENESIN ER 600 MG PO TB12: 600.0000 mg | ORAL_TABLET | Freq: Two times a day (BID) | ORAL | 0 refills | Status: DC

## 2022-12-17 MED ORDER — SALINE SPRAY 0.65 % NA SOLN: 1.0000 | NASAL | 0 refills | Status: DC | PRN

## 2022-12-17 MED ORDER — SODIUM CHLORIDE 0.9 % IV SOLN: 25.0000 mg | Freq: Once | INTRAVENOUS | Status: DC

## 2022-12-17 MED ORDER — PROMETHAZINE HCL 25 MG PO TABS: 25.0000 mg | ORAL_TABLET | Freq: Four times a day (QID) | ORAL | 0 refills | Status: DC | PRN

## 2022-12-17 MED ORDER — LACTATED RINGERS IV BOLUS
1000.0000 mL | Freq: Once | INTRAVENOUS | Status: AC
  Administered 2022-12-17: 1000 mL via INTRAVENOUS

## 2022-12-17 NOTE — MAU Note (Signed)
Anne Richardson is a 36 y.o. at [redacted]w[redacted]d here in MAU reporting: since yesterday has been having nausea and aching. Has been taking promethazine, last dose was at 10 this morning. 8-10 episodes of emesis since yesterday.  Onset of complaint: yesterday  Pain score: 10/10  Vitals:   12/17/22 1447  BP: 121/69  Pulse: (!) 103  Resp: 18  Temp: 100.1 F (37.8 C)  SpO2: 97%     OEU:[id number]  Lab orders placed from triage: UA

## 2022-12-17 NOTE — Discharge Instructions (Signed)

## 2022-12-17 NOTE — MAU Provider Note (Signed)
History     CSN: [id number]  Arrival date and time: 12/17/22 1430   None     Chief Complaint  Patient presents with   Vomiting   Nausea   HPI Anne Richardson is a 36 y.o. [id number] at [redacted]w[redacted]d by LMP who presents to MAU via EMS for generalized body aches, worsening vomiting, and headache. She reports symptoms started yesterday and have progressively worsened throughout the day. She reports it feels like her head has been hit with a hammer and her body has been run over by a train. She reports feeling very fatigued. She has vomiting with pregnancy but reports for the last 24 hours she has had approximately 8-10 episodes of vomiting. Zofran and Promethazine are not helping. She reports she has had chills, sweats, itchy throat, cough that aggravates vomiting, congestion and postnasal drip. She reports she was around her niece who had cough and runny nose x1 days. She denies vaginal bleeding, abdominal pain. Has had some pressure with urination but denies burning/pain, hematuria or other urinary symptoms. She reports she has some constipation, last BM was Monday. She reports she was given a prescription for Miralax but has not used it because "it makes my poop like soft serve and seeing it makes me vomit".   Patient is scheduled for a NOB with [facility] on 12/31/2022.   Of note, patient was previously admitted for a possible c-section scar ectopic but was told this was likely not the case. She did follow up with Dr. Jamse Arn after discharge but does not have any follow up scheduled with him.  OB History     Gravida  8   Para  4   Term  3   Preterm  1   AB  2   Living  5      SAB  2   IAB  0   Ectopic      Multiple  1   Live Births  5           Past Medical History:  Diagnosis Date   Anxiety    Chlamydia    Depression    doing good now   Gestational diabetes    Gonorrhea    Insufficient prenatal care in third trimester 07/20/2017   Onset of care at 38w   Pregnancy  induced hypertension    Pulmonary embolism (HCC)     Past Surgical History:  Procedure Laterality Date   BIOPSY BREAST     BREAST LUMPECTOMY     left   CESAREAN SECTION     CESAREAN SECTION  06/28/2012   Procedure: CESAREAN SECTION;  Surgeon: Lesly Dukes, MD;  Location: [facility];  Service: Gynecology;  Laterality: N/A;   CESAREAN SECTION N/A 03/21/2016   Procedure: CESAREAN SECTION;  Surgeon: Lazaro Arms, MD;  Location: [facility];  Service: Obstetrics;  Laterality: N/A;   CESAREAN SECTION N/A 07/24/2017   Procedure: CESAREAN SECTION;  Surgeon: [name], MD;  [facility];  Service: Obstetrics;  Laterality: N/A;   WISDOM TOOTH EXTRACTION     age 49    Family History  Problem Relation Age of Onset   Cancer Mother 54       breast   Alcohol abuse Mother    Depression Mother    Hypertension Mother    Depression Sister    Hypertension Sister    Asthma Brother    Depression Brother    Hypertension Brother    Alcohol abuse  Maternal Aunt    Hypertension Maternal Aunt    Cancer Maternal Aunt [phone]       breast ca   Alcohol abuse Maternal Uncle    Hypertension Maternal Uncle    Heart disease Maternal Grandmother    Alcohol abuse Maternal Grandmother    Hypertension Maternal Grandmother    Heart disease Maternal Grandfather    Alcohol abuse Maternal Grandfather    Hypertension Maternal Grandfather    Cancer Maternal Grandfather        prostate and bone   Depression Paternal Grandfather    Alzheimer's disease Paternal Grandmother    Diabetes Paternal Grandmother     Social History   Tobacco Use   Smoking status: Former    Types: Cigarettes, Cigars   Smokeless tobacco: Never  Vaping Use   Vaping Use: Never used  Substance Use Topics   Alcohol use: No   Drug use: Not Currently    Types: Marijuana    Allergies:  Allergies  Allergen Reactions   Penicillins Itching and Swelling    Has patient had a PCN reaction causing immediate rash,  facial/tongue/throat swelling, SOB or lightheadedness with hypotension: No Has patient had a PCN reaction causing severe rash involving mucus membranes or skin necrosis: No Has patient had a PCN reaction that required hospitalization No Has patient had a PCN reaction occurring within the last 10 years: No If all of the above answers are "NO", then may proceed with Cephalosporin use.    Pork-Derived Products Other (See Comments)    Religious reasons     Medications Prior to Admission  Medication Sig Dispense Refill Last Dose   acetaminophen (TYLENOL) 500 MG tablet Take 2 tablets (1,000 mg total) by mouth every 6 (six) hours as needed for mild pain or moderate pain. 30 tablet 0 Past Month   albuterol (VENTOLIN HFA) 108 (90 Base) MCG/ACT inhaler Inhale 2 puffs into the lungs every 6 (six) hours as needed for wheezing or shortness of breath. 8.5 g 3 Past Month   ondansetron (ZOFRAN) 4 MG tablet Take 1 tablet (4 mg total) by mouth every 6 (six) hours as needed for nausea. 20 tablet 0 Past Week   [DISCONTINUED] promethazine (PHENERGAN) 25 MG tablet Take 1 tablet (25 mg total) by mouth every 6 (six) hours as needed for nausea. 30 tablet 0 12/17/2022   oxyCODONE (OXY IR/ROXICODONE) 5 MG immediate release tablet Take 2 tablets (10 mg total) by mouth every 3 (three) hours as needed for severe pain or breakthrough pain. 24 tablet 0 More than a month   polyethylene glycol (MIRALAX / GLYCOLAX) 17 g packet Take 17 g by mouth daily. 28 each 0 More than a month   sertraline (ZOLOFT) 50 MG tablet Take 1 tablet (50 mg total) by mouth daily. 30 tablet 3 More than a month   Review of Systems  Constitutional:  Positive for chills and diaphoresis.  HENT:  Positive for congestion and postnasal drip.   Respiratory:  Positive for cough.   Cardiovascular: Negative.   Gastrointestinal:  Positive for constipation and vomiting. Negative for abdominal pain.  Genitourinary: Negative.   Musculoskeletal:  Positive for  back pain and myalgias.  Neurological:  Positive for headaches.   Physical Exam  Patient Vitals for the past 24 hrs:  BP Temp Temp src Pulse Resp SpO2 Height Weight  12/17/22 1810 (!) 96/44 98.6 F (37 C) Oral 88 18 98 % -- --  12/17/22 1509 (!) 108/54 99.4 F (37.4 C) Oral Marland Kitchen(!)  102 20 100 % -- --  12/17/22 1447 121/69 100.1 F (37.8 C) Oral (!) 103 18 97 % -- --  12/17/22 1442 -- -- -- -- -- -- 5\' 6"  (1.676 m) 122.3 kg   Physical Exam Vitals and nursing note reviewed.  Constitutional:      General: She is not in acute distress.    Appearance: She is obese.  HENT:     Nose: Congestion present.  Eyes:     Extraocular Movements: Extraocular movements intact.     Pupils: Pupils are equal, round, and reactive to light.  Cardiovascular:     Rate and Rhythm: Tachycardia present.  Pulmonary:     Effort: Pulmonary effort is normal. No respiratory distress.  Abdominal:     Palpations: Abdomen is soft.     Tenderness: There is no abdominal tenderness. There is no right CVA tenderness or left CVA tenderness.  Musculoskeletal:        General: Normal range of motion.     Cervical back: Normal range of motion.  Skin:    General: Skin is warm and dry.  Neurological:     General: No focal deficit present.     Mental Status: She is alert and oriented to person, place, and time.  Psychiatric:        Mood and Affect: Mood normal.        Behavior: Behavior normal.    Results for orders placed or performed during the hospital encounter of 12/17/22 (from the past 24 hour(s))  Urinalysis, Routine w reflex microscopic     Status: Abnormal   Collection Time: 12/17/22  3:30 PM  Result Value Ref Range   Color, Urine YELLOW YELLOW   APPearance HAZY (A) CLEAR   Specific Gravity, Urine 1.024 1.005 - 1.030   pH 5.0 5.0 - 8.0   Glucose, UA NEGATIVE NEGATIVE mg/dL   Hgb urine dipstick NEGATIVE NEGATIVE   Bilirubin Urine NEGATIVE NEGATIVE   Ketones, ur NEGATIVE NEGATIVE mg/dL   Protein, ur 30  (A) NEGATIVE mg/dL   Nitrite NEGATIVE NEGATIVE   Leukocytes,Ua NEGATIVE NEGATIVE   RBC / HPF 0-5 0 - 5 RBC/hpf   WBC, UA 6-10 0 - 5 WBC/hpf   Bacteria, UA RARE (A) NONE SEEN   Squamous Epithelial / LPF 6-10 0 - 5 /HPF   Mucus PRESENT   Resp panel by RT-PCR (RSV, Flu A&B, Covid) Anterior Nasal Swab     Status: Abnormal   Collection Time: 12/17/22  3:39 PM   Specimen: Anterior Nasal Swab  Result Value Ref Range   SARS Coronavirus 2 by RT PCR NEGATIVE NEGATIVE   Influenza A by PCR POSITIVE (A) NEGATIVE   Influenza B by PCR NEGATIVE NEGATIVE   Resp Syncytial Virus by PCR NEGATIVE NEGATIVE  CBC     Status: Abnormal   Collection Time: 12/17/22  4:01 PM  Result Value Ref Range   WBC 6.2 4.0 - 10.5 K/uL   RBC 4.35 3.87 - 5.11 MIL/uL   Hemoglobin 11.0 (L) 12.0 - 15.0 g/dL   HCT 12/19/22 (L) 13.2 - 44.0 %   MCV 77.9 (L) 80.0 - 100.0 fL   MCH 25.3 (L) 26.0 - 34.0 pg   MCHC 32.4 30.0 - 36.0 g/dL   RDW 10.2 (H) 72.5 - 36.6 %   Platelets 349 150 - 400 K/uL   nRBC 0.0 0.0 - 0.2 %  Comprehensive metabolic panel     Status: Abnormal   Collection Time: 12/17/22  4:01 PM  Result Value Ref Range   Sodium 132 (L) 135 - 145 mmol/L   Potassium 3.3 (L) 3.5 - 5.1 mmol/L   Chloride 102 98 - 111 mmol/L   CO2 21 (L) 22 - 32 mmol/L   Glucose, Bld 87 70 - 99 mg/dL   BUN 6 6 - 20 mg/dL   Creatinine, Ser 1.03 0.44 - 1.00 mg/dL   Calcium 8.7 (L) 8.9 - 10.3 mg/dL   Total Protein 7.2 6.5 - 8.1 g/dL   Albumin 3.0 (L) 3.5 - 5.0 g/dL   AST 18 15 - 41 U/L   ALT 12 0 - 44 U/L   Alkaline Phosphatase 53 38 - 126 U/L   Total Bilirubin 0.5 0.3 - 1.2 mg/dL   GFR, Estimated >15 >94 mL/min   Anion gap 9 5 - 15    MAU Course  Procedures  MDM UA, culture pending Covid/Flu/RSV CBC, CMP LR, Phenergan  UA with small amount of bacteria. Culture pending. Labs reassuring. Potassium is 3.3 however after review of previous values, this is within patient's normal value. Nausea improved after medications. No episodes of  vomiting during stay. Patient given 1L LR along with Tylenol. Patient is positive for Flu A. I offered Tamiflu to patient however she declines. I discussed symptom management at home. Will send Ocean nasal spray and Mucinex to patient's pharmacy. Will also refill Promethazine and send suppositories prn.   Assessment and Plan  [redacted] weeks gestation of pregnancy Influenza A  - Discharge home in stable condition - Rx sent to pharmacy - Return precautions given. Return to MAU for new/worsening symptoms - Keep NOB as scheduled on 12/31/2022   Brand Males, CNM 12/17/2022, 6:27 PM

## 2022-12-19 LAB — CULTURE, OB URINE: Culture: 60000 — AB

## 2022-12-20 ENCOUNTER — Other Ambulatory Visit: Payer: Self-pay | Admitting: Advanced Practice Midwife

## 2022-12-20 ENCOUNTER — Encounter: Payer: Self-pay | Admitting: Advanced Practice Midwife

## 2022-12-20 DIAGNOSIS — O2341 Unspecified infection of urinary tract in pregnancy, first trimester: Secondary | ICD-10-CM

## 2022-12-20 MED ORDER — NITROFURANTOIN MONOHYD MACRO 100 MG PO CAPS
100.0000 mg | ORAL_CAPSULE | Freq: Two times a day (BID) | ORAL | 0 refills | Status: DC
  Filled 2022-12-20: qty 14, 7d supply, fill #0

## 2022-12-20 NOTE — Progress Notes (Signed)
+   UTI per urine culture. Patient's listed phone numbers ring "unable to complete call". Will rx Macrobid based on documented PCN allergy. Will attempt to notify patient via MyChart account.  Clayton Bibles, MSA, MSN, CNM Certified Nurse Midwife, Chiropractor

## 2022-12-21 ENCOUNTER — Other Ambulatory Visit: Payer: Self-pay

## 2022-12-22 ENCOUNTER — Other Ambulatory Visit: Payer: Self-pay

## 2022-12-28 ENCOUNTER — Other Ambulatory Visit: Payer: Self-pay

## 2022-12-31 ENCOUNTER — Encounter: Payer: Self-pay | Admitting: Obstetrics and Gynecology

## 2022-12-31 ENCOUNTER — Other Ambulatory Visit: Payer: Self-pay | Admitting: Obstetrics and Gynecology

## 2022-12-31 ENCOUNTER — Other Ambulatory Visit: Payer: Self-pay

## 2022-12-31 ENCOUNTER — Ambulatory Visit: Payer: Medicaid Other | Attending: Obstetrics and Gynecology | Admitting: *Deleted

## 2022-12-31 ENCOUNTER — Other Ambulatory Visit
Admission: RE | Admit: 2022-12-31 | Discharge: 2022-12-31 | Disposition: A | Discharge status: Home / Self Care | Payer: Medicaid Other | Source: Ambulatory Visit | Attending: Maternal & Fetal Medicine | Admitting: Maternal & Fetal Medicine

## 2022-12-31 ENCOUNTER — Ambulatory Visit (HOSPITAL_BASED_OUTPATIENT_CLINIC_OR_DEPARTMENT_OTHER): Payer: Medicaid Other

## 2022-12-31 ENCOUNTER — Ambulatory Visit (INDEPENDENT_AMBULATORY_CARE_PROVIDER_SITE_OTHER): Payer: Medicaid Other | Admitting: Obstetrics and Gynecology

## 2022-12-31 ENCOUNTER — Ambulatory Visit (HOSPITAL_BASED_OUTPATIENT_CLINIC_OR_DEPARTMENT_OTHER): Payer: Medicaid Other | Admitting: Maternal & Fetal Medicine

## 2022-12-31 VITALS — BP 122/77 | HR 91 | Wt 280.1 lb

## 2022-12-31 VITALS — BP 113/71 | HR 88

## 2022-12-31 DIAGNOSIS — O43211 Placenta accreta, first trimester: Secondary | ICD-10-CM | POA: Diagnosis not present

## 2022-12-31 DIAGNOSIS — O34219 Maternal care for unspecified type scar from previous cesarean delivery: Secondary | ICD-10-CM

## 2022-12-31 DIAGNOSIS — Z6841 Body Mass Index (BMI) 40.0 and over, adult: Secondary | ICD-10-CM

## 2022-12-31 DIAGNOSIS — Z3A12 12 weeks gestation of pregnancy: Secondary | ICD-10-CM

## 2022-12-31 DIAGNOSIS — O2340 Unspecified infection of urinary tract in pregnancy, unspecified trimester: Secondary | ICD-10-CM | POA: Insufficient documentation

## 2022-12-31 DIAGNOSIS — O0991 Supervision of high risk pregnancy, unspecified, first trimester: Secondary | ICD-10-CM

## 2022-12-31 DIAGNOSIS — O43212 Placenta accreta, second trimester: Secondary | ICD-10-CM | POA: Diagnosis present

## 2022-12-31 DIAGNOSIS — O283 Abnormal ultrasonic finding on antenatal screening of mother: Secondary | ICD-10-CM | POA: Insufficient documentation

## 2022-12-31 DIAGNOSIS — Z8632 Personal history of gestational diabetes: Secondary | ICD-10-CM

## 2022-12-31 DIAGNOSIS — Z86711 Personal history of pulmonary embolism: Secondary | ICD-10-CM

## 2022-12-31 DIAGNOSIS — Z8759 Personal history of other complications of pregnancy, childbirth and the puerperium: Secondary | ICD-10-CM

## 2022-12-31 DIAGNOSIS — O2341 Unspecified infection of urinary tract in pregnancy, first trimester: Secondary | ICD-10-CM

## 2022-12-31 DIAGNOSIS — O99210 Obesity complicating pregnancy, unspecified trimester: Secondary | ICD-10-CM

## 2022-12-31 DIAGNOSIS — O99211 Obesity complicating pregnancy, first trimester: Secondary | ICD-10-CM

## 2022-12-31 DIAGNOSIS — O09521 Supervision of elderly multigravida, first trimester: Secondary | ICD-10-CM

## 2022-12-31 DIAGNOSIS — Z98891 History of uterine scar from previous surgery: Secondary | ICD-10-CM

## 2022-12-31 DIAGNOSIS — F250 Schizoaffective disorder, bipolar type: Secondary | ICD-10-CM

## 2022-12-31 HISTORY — DX: Unspecified infection of urinary tract in pregnancy, unspecified trimester: O23.40

## 2022-12-31 HISTORY — DX: Personal history of gestational diabetes: Z86.32

## 2022-12-31 MED ORDER — NITROFURANTOIN MONOHYD MACRO 100 MG PO CAPS: 100.0000 mg | ORAL_CAPSULE | Freq: Two times a day (BID) | ORAL | 0 refills | Status: DC

## 2022-12-31 MED ORDER — ALBUTEROL SULFATE HFA 108 (90 BASE) MCG/ACT IN AERS: 2.0000 | INHALATION_SPRAY | Freq: Four times a day (QID) | RESPIRATORY_TRACT | 3 refills | Status: AC | PRN

## 2022-12-31 NOTE — Progress Notes (Signed)
New OB Note  12/31/2022   Clinic: [facility]   Chief Complaint: new OB  Transfer of Care Patient: no  History of Present Illness: Ms. Skora is a 37 y.o. [id number] @ 12/5 weeks ([place] 7/20, based on Patient's last menstrual period was 10/03/2022 (exact date).=9YV u/s  Preg complicated by has Obesity in pregnancy; Previous cesarean section; Schizoaffective disorder, bipolar type ([place]); PTSD (post-traumatic stress disorder); Cannabis abuse; History of pulmonary embolism; AMA (advanced maternal age) multigravida 35+; BMI 40.0-44.9, adult ([place]); Supervision of high risk pregnancy in first trimester; UTI in pregnancy; History of gestational hypertension; and History of gestational diabetes mellitus (GDM) on their problem list.   No current OB s/s.   ROS: A 12-point review of systems was performed and negative, except as stated in the above HPI.  OBGYN History: As per HPI. OB History  Gravida Para Term Preterm AB Living  8 4 3 1 3 5   SAB IAB Ectopic Multiple Live Births  3 0   1 5    # Outcome Date GA Lbr Len/2nd Weight Sex Delivery Anes PTL Lv  8 Current           7 Term 07/24/17 [redacted]w[redacted]d  7 lb 2.8 oz (3.255 kg) F CS-LTranv Spinal  LIV     Birth Comments: wnl  6A Preterm 03/21/16 [redacted]w[redacted]d  5 lb 15.2 oz (2.7 kg) M CS-LTranv Spinal  LIV  6B Preterm 03/21/16 [redacted]w[redacted]d  4 lb 14.7 oz (2.23 kg) M CS-LTranv Spinal  LIV  5 Term 06/28/12 [redacted]w[redacted]d  9 lb 14.1 oz (4.483 kg) F CS-LTranv Spinal  LIV  4 Term 12/13/10   7 lb 13 oz (3.544 kg) F CS-LTranv   LIV     Birth Comments: high blood pressure, fever, meconium  3 SAB 12/05/09          2 SAB           1 SAB            History of pap smears: Yes. Last pap smear 09/2021 and results were negative and hpv negative (see media tab)   Past Medical History: Past Medical History:  Diagnosis Date   Anxiety    Chlamydia    Depression    doing good now   Gestational diabetes    Gonorrhea    Insufficient prenatal care in third  trimester 07/20/2017   Onset of care at 38w   Pregnancy induced hypertension    Pulmonary embolism ([place])     Past Surgical History: Past Surgical History:  Procedure Laterality Date   BIOPSY BREAST     BREAST LUMPECTOMY     left   CESAREAN SECTION     CESAREAN SECTION  06/28/2012   Procedure: CESAREAN SECTION;  Surgeon: Guss Bunde, MD;  Location: [facility];  Service: Gynecology;  Laterality: N/A;   CESAREAN SECTION N/A 03/21/2016   Procedure: CESAREAN SECTION;  Surgeon: Florian Buff, MD;  Location: [facility];  Service: Obstetrics;  Laterality: N/A;   CESAREAN SECTION N/A 07/24/2017   Procedure: CESAREAN SECTION;  Surgeon: Chancy Milroy, MD;  Location: [facility];  Service: Obstetrics;  Laterality: N/A;   WISDOM TOOTH EXTRACTION     age 2    Family History:  Family History  Problem Relation Age of Onset   Cancer Mother 42       breast   Alcohol abuse Mother    Depression Mother    Hypertension Mother  Depression Sister    Hypertension Sister    Asthma Brother    Depression Brother    Hypertension Brother    Alcohol abuse Maternal Aunt    Hypertension Maternal Aunt    Cancer Maternal Aunt 65       breast ca   Alcohol abuse Maternal Uncle    Hypertension Maternal Uncle    Heart disease Maternal Grandmother    Alcohol abuse Maternal Grandmother    Hypertension Maternal Grandmother    Heart disease Maternal Grandfather    Alcohol abuse Maternal Grandfather    Hypertension Maternal Grandfather    Cancer Maternal Grandfather        prostate and bone   Depression Paternal Grandfather    Alzheimer's disease Paternal Grandmother    Diabetes Paternal Grandmother     Social History:  Social History   Socioeconomic History   Marital status: Single    Spouse name: Not on file   Number of children: Not on file   Years of education: Not on file   Highest education level: Not on file  Occupational History   Not on file  Tobacco Use   Smoking status: Every Day     Packs/day: 0.10    Types: Cigarettes, Cigars   Smokeless tobacco: Never  Vaping Use   Vaping Use: Never used  Substance and Sexual Activity   Alcohol use: No   Drug use: Not Currently    Types: Marijuana   Sexual activity: Not Currently    Birth control/protection: None  Other Topics Concern   Not on file  Social History Narrative   Single, Lives with infant child, brother, and boyfriend (father of children)    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   Social Determinants of Radio broadcast assistant Strain: Not on file  Food Insecurity: Not on file  Transportation Needs: Not on file  Physical Activity: Not on file  Stress: Not on file  Social Connections: Not on file  Intimate Partner Violence: Not At Risk (11/30/2022)   Humiliation, Afraid, Rape, and Kick questionnaire    Fear of Current or Ex-Partner: No    Emotionally Abused: No    Physically Abused: No    Sexually Abused: No   Allergy: Allergies  Allergen Reactions   Penicillins Itching and Swelling    Patient tolerates: amoxicillin, cefazolin, and cephalexin  Has patient had a PCN reaction causing immediate rash, facial/tongue/throat swelling, SOB or lightheadedness with hypotension: No Has patient had a PCN reaction causing severe rash involving mucus membranes or skin necrosis: No Has patient had a PCN reaction that required hospitalization No Has patient had a PCN reaction occurring within the last 10 years: No If all of the above answers are "NO", then may proceed with Cephalosporin use.    Pork-Derived Products Other (See Comments)    Religious reasons     Current Outpatient Medications: Prenatal vitamin  Physical Exam:   BP 122/77   Pulse 91   Wt 280 lb 1.6 oz (127.1 kg)   LMP 10/03/2022 (Exact Date)   BMI 45.21 kg/m  Body mass index is 45.21 kg/m. Contractions: Not present Vag. Bleeding: None. Fundal height: not applicable FHTs: 237S  General appearance: Well  nourished, well developed female in no acute distress.  Neck:  Supple, normal appearance, and no thyromegaly  Cardiovascular: S1, S2 normal, no murmur, rub or gallop, regular rate and rhythm Respiratory:  Clear to auscultation bilateral. Normal respiratory effort Abdomen: positive bowel sounds  and no masses, hernias; diffusely non tender to palpation, non distended Breasts: patient denies any breast s/s. Marland Kitchen Neuro/Psych:  Normal mood and affect.  Skin:  Warm and dry.   Laboratory: Epic chart reviewed  Imaging:  Epic chart reviewed  Assessment:   Plan: 1. ?C-section scar ectopic Patient admitted to the hospital 12/11-12 for concern for c-section scar ectopic; patient had MRI and showed this to be unlikely. She was set up to see REI (Dr. April Manson) as an outpatient.  She states she saw him after leaving the hospital and he did a transvaginal u/s and thought, at most, a small portion "tail" was implanted in the scar, but didn't recommend any additional work up or intervention and just told her that she may have to have a hysterectomy at the time of her c-section  ROI signed at the front desk to get REI records  Stat u/s with MFM to verify pregnancy implantation.    2. History of gestational diabetes mellitus (GDM) Early a1c  3. Multigravida of advanced maternal age in first trimester F/u panorama, horizon testing. Offer afp after 15wks  4. Supervision of high risk pregnancy in first trimester Start low dose ASA after 12wks - Anemia Profile B - Comprehensive metabolic panel - Protein / creatinine ratio, urine - Ambulatory referral to [facility] - GC/Chlamydia probe amp ([place])not at [facility] - Hemoglobin A1c - CBC/D/Plt+RPR+Rh+ABO+RubIgG... - [place] MFM OB Transvaginal; Future  5. Urinary tract infection in mother during first trimester of pregnancy Hasn't picked up macrobid. New Rx sent to pharmacy of choice  6. BMI 40.0-44.9, adult (HCC)  7. History of  gestational hypertension  8. History of pulmonary embolism In early 2023. She states she was on eliquis and that repeat imaging showed the PE gone, which is what I see in Epic. I told her that she needs to be on anti-coagulation and it would be a shot daily, which she has never done before. Will check with pharmacy about teaching.   Until location of pregnancy is truly verified, recommend holding off on anti-coagulation  9. Previous cesarean section Will need repeat. Last op note didn't mention scar tissue except for some on the left ovary.   10. Schizoaffective disorder, bipolar type (HCC) Not taking SSRI. Mood wnl.   Problem list reviewed and updated.  Follow up in 3 weeks.  The nature of [place] [facility] with multiple MDs and other Advanced Practice Providers was explained to patient; also emphasized that residents, students are part of our team.  >50% of 35 min visit spent on counseling and coordination of care.     Cornelia Copa MD Attending Center for [facility])

## 2023-01-01 ENCOUNTER — Other Ambulatory Visit: Payer: Medicaid Other

## 2023-01-01 ENCOUNTER — Ambulatory Visit: Payer: Medicaid Other

## 2023-01-01 ENCOUNTER — Other Ambulatory Visit: Payer: Self-pay | Admitting: *Deleted

## 2023-01-01 DIAGNOSIS — O43211 Placenta accreta, first trimester: Secondary | ICD-10-CM

## 2023-01-01 LAB — GC/CHLAMYDIA PROBE AMP (~~LOC~~) NOT AT ARMC
Chlamydia: NEGATIVE
Comment: NEGATIVE
Comment: NORMAL
Neisseria Gonorrhea: NEGATIVE

## 2023-01-01 LAB — HM PAP SMEAR: Pap: NEGATIVE

## 2023-01-01 NOTE — Progress Notes (Signed)
MFM Consult Note Patient Name: Anne Richardson  Patient MRN:   [id number]  Referring provider: [facility]  Reason for Consult: Placenta accreta spectrum    HPI: Anne Richardson is a 37 y.o. [id number] at [redacted]w[redacted]d by last menstrual period consistent with early ultrasound here as an add on consult.   Anne Richardson was seen for her initial prenatal visit today and an urgent consult was requested due to concern for a placenta accreta spectrum versus an early cesarean scar ectopic pregnancy.  RE PAS: The patient has a history of 4 prior cesarean deliveries.  In the past month there has been concern for either a cesarean scar pregnancy/early placenta accreta. The patient initially presented to the maternity assessment unit at [facility] on 11/29/2022 and there was concern for an ectopic cesarean scar pregnancy with possible uterine rupture due to free fluid on her ultrasound.  Her case was discussed with Dr. Governor Specking who recommended an MRI. An MRI was performed and there was no evidence of cesarean scar pregnancy or uterine rupture per the report and the pregnancy was located within the uterine cavity. Initially there was discussion of treating this as a cesarean scar ectopic with methotrexate and consultation with interventional radiology for Gelfoam placement into the uterine arteries with suction D&C. However, after further discussion and review of the MRI and [place] with the day OB attending, the free fluid was thought to represent possible cyst rupture versus blood egress outside the tube. Her pain also improved during admission and she was discharged. She followed up with Dr. Kerin Perna and there was low concern for an "in the niche" cesarean scar ectopic but she was counseled this could still be an "on the niche" ectopic that could turn into a placenta accreta.  Today she has some pelvic pain that is mild but otherwise feels fine. She denies vaginal bleeding. She desires to continue  the pregnancy but is not against termination if it were required to save her life.   RE PE: She has a history of a pulmonary embolism that was diagnosed in January 2023.  Prior to the diagnosis she developed right-sided chest pain radiating to her shoulder blade.  The pain was sharp and pleuritic.  She also had associated shortness of breath with exertion.  She had a history of smoking at the time.  She underwent a workup and was diagnosed with a pulmonary embolism.  The CT scan showed a single segmental right upper lobe pulmonary embolism in addition to a 3 mm right solid pulmonary nodule.  She was anticoagulated with Eliquis and referred to hematology. The plan was to anticoagulate with prophylactic or intermediate Lovenox.  RE schizoaffective disorder: She has a history of schizoaffective disorder and is managed on sertraline 50 mg daily.  She is from [place] originally and eventually planned on moving back [place] this month but she may now stay in the area.   Review of Systems: A review of systems was performed and was negative except per HPI   Past Obstetrical History:  OB History  Gravida Para Term Preterm AB Living  8 4 3 1 3 5   SAB IAB Ectopic Multiple Live Births  3 0   1 5    # Outcome Date GA Lbr Len/2nd Weight Sex Delivery Anes PTL Lv  8 Current           7 Term 07/24/17 [redacted]w[redacted]d  7 lb 2.8 oz (3.255 kg) F CS-LTranv Spinal  LIV  Birth Comments: wnl  6A Preterm 03/21/16 [redacted]w[redacted]d  5 lb 15.2 oz (2.7 kg) M CS-LTranv Spinal  LIV  6B Preterm 03/21/16 [redacted]w[redacted]d  4 lb 14.7 oz (2.23 kg) M CS-LTranv Spinal  LIV  5 Term 06/28/12 [redacted]w[redacted]d  9 lb 14.1 oz (4.483 kg) F CS-LTranv Spinal  LIV  4 Term 12/13/10   7 lb 13 oz (3.544 kg) F CS-LTranv   LIV     Birth Comments: high blood pressure, fever, meconium  3 SAB 12/05/09          2 SAB           1 SAB             Past Gynecologic History:  Not discussed  Past Medical History:  Past Medical History:  Diagnosis Date   Anxiety    Chlamydia     Depression    doing good now   Gestational diabetes    Gonorrhea    Insufficient prenatal care in third trimester 07/20/2017   Onset of care at 38w   Pregnancy induced hypertension    Pulmonary embolism (HCC)     Past Surgical History:  Past Surgical History:  Procedure Laterality Date   BIOPSY BREAST     BREAST LUMPECTOMY     left   CESAREAN SECTION     CESAREAN SECTION  06/28/2012   Procedure: CESAREAN SECTION;  Surgeon: Lesly Dukes, MD;  Location: [facility];  Service: Gynecology;  Laterality: N/A;   CESAREAN SECTION N/A 03/21/2016   Procedure: CESAREAN SECTION;  Surgeon: Lazaro Arms, MD;  Location: [facility];  Service: Obstetrics;  Laterality: N/A;   CESAREAN SECTION N/A 07/24/2017   Procedure: CESAREAN SECTION;  Surgeon: [name], MD;  Location: [facility];  Service: Obstetrics;  Laterality: N/A;   WISDOM TOOTH EXTRACTION     age 66     Family History:   family history includes Alcohol abuse in her maternal aunt, maternal grandfather, maternal grandmother, maternal uncle, and mother; Alzheimer's disease in her paternal grandmother; Asthma in her brother; Cancer in her maternal grandfather; Cancer (age of onset: 51) in her mother; Cancer (age of onset: 53) in her maternal aunt; Depression in her brother, mother, paternal grandfather, and sister; Diabetes in her paternal grandmother; Heart disease in her maternal grandfather and maternal grandmother; Hypertension in her brother, maternal aunt, maternal grandfather, maternal grandmother, maternal uncle, mother, and sister.    Social History:   Social History   Socioeconomic History   Marital status: Single    Spouse name: Not on file   Number of children: Not on file   Years of education: Not on file   Highest education level: Not on file  Occupational History   Not on file  Tobacco Use   Smoking status: Every Day    Types: Cigars   Smokeless tobacco: Never  Vaping Use   Vaping Use: Never used  Substance and  Sexual Activity   Alcohol use: No   Drug use: Not Currently    Types: Marijuana    Comment: last use jan 2024   Sexual activity: Not Currently    Birth control/protection: None  Other Topics Concern   Not on file  Social History Narrative   Single, Lives with infant child, brother, and boyfriend (father of children)    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   Social Determinants of Corporate investment banker Strain: Not on file  Food Insecurity: No  Food Insecurity (12/31/2022)   Hunger Vital Sign    Worried About Running Out of Food in the Last Year: Never true    Ran Out of Food in the Last Year: Never true  Transportation Needs: No Transportation Needs (12/31/2022)   PRAPARE - Hydrologist (Medical): No    Lack of Transportation (Non-Medical): No  Physical Activity: Not on file  Stress: Not on file  Social Connections: Not on file  Intimate Partner Violence: Not At Risk (11/30/2022)   Humiliation, Afraid, Rape, and Kick questionnaire    Fear of Current or Ex-Partner: No    Emotionally Abused: No    Physically Abused: No    Sexually Abused: No      Home Medications:   Current Outpatient Medications on File Prior to Visit  Medication Sig Dispense Refill   acetaminophen (TYLENOL) 500 MG tablet Take 2 tablets (1,000 mg total) by mouth every 6 (six) hours as needed for mild pain or moderate pain. 30 tablet 0   albuterol (VENTOLIN HFA) 108 (90 Base) MCG/ACT inhaler Inhale 2 puffs into the lungs every 6 (six) hours as needed for wheezing or shortness of breath. 8.5 g 3   nitrofurantoin, macrocrystal-monohydrate, (MACROBID) 100 MG capsule Take 1 capsule (100 mg total) by mouth 2 (two) times daily. 14 capsule 0   polyethylene glycol (MIRALAX / GLYCOLAX) 17 g packet Take 17 g by mouth daily. (Patient not taking: Reported on 12/31/2022) 28 each 0   Prenatal Vit-Fe Fumarate-FA (PRENATAL FA PO) Take by mouth.     promethazine (PHENERGAN) 25  MG suppository Place 1 suppository (25 mg total) rectally every 6 (six) hours as needed for nausea or vomiting. (Patient not taking: Reported on 12/31/2022) 12 each 0   promethazine (PHENERGAN) 25 MG tablet Take 1 tablet (25 mg total) by mouth every 6 (six) hours as needed for nausea. 30 tablet 0   sertraline (ZOLOFT) 50 MG tablet Take 1 tablet (50 mg total) by mouth daily. (Patient not taking: Reported on 12/31/2022) 30 tablet 3   No current facility-administered medications on file prior to visit.      Allergies:   Allergies  Allergen Reactions   Penicillins Itching and Swelling    Patient tolerates: amoxicillin, cefazolin, and cephalexin  Has patient had a PCN reaction causing immediate rash, facial/tongue/throat swelling, SOB or lightheadedness with hypotension: No Has patient had a PCN reaction causing severe rash involving mucus membranes or skin necrosis: No Has patient had a PCN reaction that required hospitalization No Has patient had a PCN reaction occurring within the last 10 years: No If all of the above answers are "NO", then may proceed with Cephalosporin use.    Pork-Derived Products Other (See Comments)    Religious reasons      Physical Exam:   Blood pressure 113/71, pulse 88, prepregnancy BMI of 36.6 Sitting comfortably on the sonogram table Nonlabored breathing Normal rate and rhythm Abdomen is nontender  Sonographic findngs Single intrauterine pregnancy at  12w 5d with EDD of 07/10/2023 dated by LMP  (10/03/22). Fetal cardiac activity:  Observed and appears normal. Very limited anatomy but no major anomalies are seen.  Amniotic fluid volume: Within normal limits.  Placenta: anterior with possible previa. A persistent uterine contraction was noted in the posterior segment which may have artificially drawn the placenta closer to the internal os.  The majority of the pegnancy occupies the uterine corpus, however, there is concern for placenta accreta spectrum or an  "  on the niche" cesarean scar ectopic pregnancy. Earlier ultrasounds show a buldging of decidual tissue into the area of the scar near the cervix. On today's ultrasound there is hypervascularity and a thinning of the retroplacental clear space in the midline of the uterus near the cervix. The placenta appears to have hypervascularity around this area as well. There are a few irregular shaped lacuna in the placenta near the cesarean scar. Due to a persistent contraction, it is difficult to know if the placenta is covering the internal os or if it is pulled over the os due to the contraction.There is no evidence of placental invasion through the bladder or pelvic sidewall. These findings are concerning for an early placenta accreta spectrum.   Assessment  Anne Richardson is a 37 y.o. [id number] at [redacted]w[redacted]d with pregnancy complicated by the following conditions:   1.  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  -Based on today's ultrasound there is a moderate risk for placenta accreta spectrum based on the presence of multiple irregular lacunae, thinning of the myometrium in the retroplacental space, hypervascularity near the lower uterine segment adjacent to the cesarean scar and anterior placenta with possible previa.  The majority of the gestational sac occupies the uterine corpus making this more likely an on the niche caesarean scar pregnancy/PAS. -I counseled the patient about the natural progression and complications of a placenta accreta spectrum pregnancy.  We discussed the increased risk of mortality and maternal morbidity.  I discussed the need for frequent ultrasounds and possibly an MRI later in the pregnancy to help aid in the diagnosis and surgical planning.  We discussed that this typically ends in a hysterectomy after the baby is delivered.  We discussed that delivery timing will be pending her  clinical course but is often around 34 weeks or sooner if she has significant pain or bleeding.  I discussed the red flag symptoms on when to return to the hospital.  I discussed that pregnancies like this are best managed at facilities with significant experience and I recommend she have a consultation with the maternal-fetal medicine department at [facility] in [place].  She verbalized understanding and request to proceed with the plan as outlined. -MFM consult to [facility], I have contacted the nurse navigator and sent a referral to have the patient seen in the next week -Follow-up ultrasound in 2 weeks in our office to confirm diagnosis -Red flag symptoms discussed  2.  History of pulmonary embolism -The patient will likely need to be on anticoagulation during the pregnancy.   -Due to the limited time of our visit we did not go into depth discussing this problem.  She has been counseled by her OB provider and the plan is to resume anticoagulation as long as she isn't having significant pain/bleeding.   2.  History of pulmonary embolism 60 minutes was spent in consultation with the patient including imaging review, chart review and patient counseling.  Thank you for the opportunity to be involved with this patient's care. Please let us know if we can be of any further assistance.   3. Multicystic ovary -The right ovary measures around 8x6x5cm with multiple small anechoic cysts with thin walls. -Continue to assess on future ultrasounds  Braxton Feathers  MFM, [facility]   01/01/2023  9:42 AM

## 2023-01-02 LAB — SPECIMEN STATUS REPORT

## 2023-01-02 LAB — TSH RFX ON ABNORMAL TO FREE T4: TSH: 1.97 u[IU]/mL (ref 0.450–4.500)

## 2023-01-04 ENCOUNTER — Ambulatory Visit: Payer: Medicaid Other

## 2023-01-04 ENCOUNTER — Other Ambulatory Visit: Payer: Self-pay

## 2023-01-04 ENCOUNTER — Ambulatory Visit (INDEPENDENT_AMBULATORY_CARE_PROVIDER_SITE_OTHER): Payer: Medicaid Other | Admitting: Obstetrics and Gynecology

## 2023-01-04 DIAGNOSIS — Z719 Counseling, unspecified: Secondary | ICD-10-CM

## 2023-01-04 NOTE — Progress Notes (Signed)
Lovenox Education:   Subjective:  Anne Richardson is here today for lovenox education. She reports she is feeling well today, she did have some nausea with her pregnancy but she stated that promethazine helps with her nausea.  Assessment/ Plan:  Anne Richardson will be on lovenox during her pregnancy due to a history of a PE. I educated the patient that lovenox is to prevent DVTs and it is safe in pregnancy. I educated her on how to to inject lovenox, to alternate sides every day when administering and how to properly discard of the syringe/ needle. I also educated her on side effects to look out for such as brusing/ bleeding and about serious side effects such as blood in the urine/ stool and vomit. We reviewed how to administer lovenox on herself with the teach back method. She stated knew how to administer it after this education visit. All questions were answered.   Anne Richardson PGY2 Pediatric Pharmacy Resident 01/04/2023 1:19 PM

## 2023-01-04 NOTE — BH Specialist Note (Signed)
Error; rescheduled due to sick child

## 2023-01-05 ENCOUNTER — Other Ambulatory Visit: Payer: Self-pay

## 2023-01-05 ENCOUNTER — Encounter: Payer: Self-pay | Admitting: Obstetrics and Gynecology

## 2023-01-05 MED ORDER — ENOXAPARIN SODIUM 60 MG/0.6ML IJ SOSY: 60.0000 mg | PREFILLED_SYRINGE | INTRAMUSCULAR | 6 refills | Status: DC

## 2023-01-05 NOTE — Telephone Encounter (Signed)
Prescription sent to patient's pharmacy, [facility], for Lovenox.

## 2023-01-08 LAB — PANORAMA PRENATAL TEST FULL PANEL:PANORAMA TEST PLUS 5 ADDITIONAL MICRODELETIONS: FETAL FRACTION: 3.8

## 2023-01-08 LAB — HORIZON CUSTOM: REPORT SUMMARY: NEGATIVE

## 2023-01-09 ENCOUNTER — Encounter: Payer: Self-pay | Admitting: Obstetrics and Gynecology

## 2023-01-09 DIAGNOSIS — O43211 Placenta accreta, first trimester: Secondary | ICD-10-CM | POA: Insufficient documentation

## 2023-01-09 DIAGNOSIS — O43212 Placenta accreta, second trimester: Secondary | ICD-10-CM | POA: Insufficient documentation

## 2023-01-12 ENCOUNTER — Ambulatory Visit: Payer: No Typology Code available for payment source | Admitting: Clinical

## 2023-01-13 ENCOUNTER — Ambulatory Visit: Payer: Medicaid Other | Admitting: *Deleted

## 2023-01-13 ENCOUNTER — Other Ambulatory Visit: Payer: Self-pay | Admitting: *Deleted

## 2023-01-13 ENCOUNTER — Ambulatory Visit: Payer: Medicaid Other | Attending: Maternal & Fetal Medicine

## 2023-01-13 VITALS — BP 133/81 | HR 102

## 2023-01-13 DIAGNOSIS — O43211 Placenta accreta, first trimester: Secondary | ICD-10-CM | POA: Diagnosis present

## 2023-01-13 DIAGNOSIS — O24419 Gestational diabetes mellitus in pregnancy, unspecified control: Secondary | ICD-10-CM

## 2023-01-13 DIAGNOSIS — O34219 Maternal care for unspecified type scar from previous cesarean delivery: Secondary | ICD-10-CM

## 2023-01-13 DIAGNOSIS — O43212 Placenta accreta, second trimester: Secondary | ICD-10-CM

## 2023-01-13 DIAGNOSIS — O10912 Unspecified pre-existing hypertension complicating pregnancy, second trimester: Secondary | ICD-10-CM

## 2023-01-13 DIAGNOSIS — O24410 Gestational diabetes mellitus in pregnancy, diet controlled: Secondary | ICD-10-CM | POA: Diagnosis not present

## 2023-01-13 DIAGNOSIS — O09522 Supervision of elderly multigravida, second trimester: Secondary | ICD-10-CM

## 2023-01-13 DIAGNOSIS — O98412 Viral hepatitis complicating pregnancy, second trimester: Secondary | ICD-10-CM | POA: Diagnosis not present

## 2023-01-13 DIAGNOSIS — Z86711 Personal history of pulmonary embolism: Secondary | ICD-10-CM

## 2023-01-13 DIAGNOSIS — Z3A14 14 weeks gestation of pregnancy: Secondary | ICD-10-CM

## 2023-01-13 DIAGNOSIS — B182 Chronic viral hepatitis C: Secondary | ICD-10-CM

## 2023-01-13 DIAGNOSIS — O99212 Obesity complicating pregnancy, second trimester: Secondary | ICD-10-CM

## 2023-01-13 DIAGNOSIS — Z7901 Long term (current) use of anticoagulants: Secondary | ICD-10-CM

## 2023-01-13 DIAGNOSIS — E669 Obesity, unspecified: Secondary | ICD-10-CM

## 2023-01-13 DIAGNOSIS — O4392 Unspecified placental disorder, second trimester: Secondary | ICD-10-CM

## 2023-01-13 NOTE — BH Specialist Note (Unsigned)
Pt did not arrive to video visit and did not answer the phone; Left HIPPA-compliant message to call back Yarnell Kozloski from [facility] at  [facility] at  [phone] (Winnie Umali's office).  ?; left MyChart message for patient.  ? ?

## 2023-01-15 LAB — CBC/D/PLT+RPR+RH+ABO+RUBIGG...
Antibody Screen: NEGATIVE
Basophils Absolute: 0 10*3/uL (ref 0.0–0.2)
Basos: 0 %
EOS (ABSOLUTE): 0.2 10*3/uL (ref 0.0–0.4)
Eos: 2 %
HCV Ab: NONREACTIVE
HIV Screen 4th Generation wRfx: NONREACTIVE
Hematocrit: 34.2 % (ref 34.0–46.6)
Hemoglobin: 11.2 g/dL (ref 11.1–15.9)
Hepatitis B Surface Ag: NEGATIVE
Immature Grans (Abs): 0.1 10*3/uL (ref 0.0–0.1)
Immature Granulocytes: 1 %
Lymphocytes Absolute: 2.2 10*3/uL (ref 0.7–3.1)
Lymphs: 19 %
MCH: 25.5 pg — ABNORMAL LOW (ref 26.6–33.0)
MCHC: 32.7 g/dL (ref 31.5–35.7)
MCV: 78 fL — ABNORMAL LOW (ref 79–97)
Monocytes Absolute: 0.9 10*3/uL (ref 0.1–0.9)
Monocytes: 8 %
Neutrophils Absolute: 8.1 10*3/uL — ABNORMAL HIGH (ref 1.4–7.0)
Neutrophils: 70 %
Platelets: 343 10*3/uL (ref 150–450)
RBC: 4.4 x10E6/uL (ref 3.77–5.28)
RDW: 18 % — ABNORMAL HIGH (ref 11.7–15.4)
RPR Ser Ql: NONREACTIVE
Rh Factor: POSITIVE
Rubella Antibodies, IGG: 3.82 index (ref >0.99)
WBC: 11.4 10*3/uL — ABNORMAL HIGH (ref 3.4–10.8)

## 2023-01-15 LAB — COMPREHENSIVE METABOLIC PANEL
ALT: 26 IU/L (ref 0–32)
AST: 26 IU/L (ref 0–40)
Albumin/Globulin Ratio: 1.1 — ABNORMAL LOW (ref 1.2–2.2)
Albumin: 3.6 g/dL — ABNORMAL LOW (ref 3.9–4.9)
Alkaline Phosphatase: 72 IU/L (ref 44–121)
BUN/Creatinine Ratio: 13 (ref 9–23)
BUN: 7 mg/dL (ref 6–20)
Bilirubin Total: <0.2 mg/dL (ref 0.0–1.2)
CO2: 20 mmol/L (ref 20–29)
Calcium: 9.5 mg/dL (ref 8.7–10.2)
Chloride: 100 mmol/L (ref 96–106)
Creatinine, Ser: 0.56 mg/dL — ABNORMAL LOW (ref 0.57–1.00)
Globulin, Total: 3.2 g/dL (ref 1.5–4.5)
Glucose: 86 mg/dL (ref 70–99)
Potassium: 3.9 mmol/L (ref 3.5–5.2)
Sodium: 134 mmol/L (ref 134–144)
Total Protein: 6.8 g/dL (ref 6.0–8.5)
eGFR: 120 mL/min/{1.73_m2} (ref >59)

## 2023-01-15 LAB — HEMOGLOBIN A1C
Est. average glucose Bld gHb Est-mCnc: 123 mg/dL
Hgb A1c MFr Bld: 5.9 % — ABNORMAL HIGH (ref 4.8–5.6)

## 2023-01-15 LAB — ANEMIA PROFILE B
Ferritin: 22 ng/mL (ref 15–150)
Folate: 8.4 ng/mL (ref >3.0)
Iron Saturation: 12 % — ABNORMAL LOW (ref 15–55)
Iron: 45 ug/dL (ref 27–159)
Retic Ct Pct: 2 % (ref 0.6–2.6)
Total Iron Binding Capacity: 371 ug/dL (ref 250–450)
UIBC: 326 ug/dL (ref 131–425)
Vitamin B-12: 389 pg/mL (ref 232–1245)

## 2023-01-15 LAB — PROTEIN / CREATININE RATIO, URINE
Creatinine, Urine: 199.2 mg/dL
Protein, Ur: 34.5 mg/dL
Protein/Creat Ratio: 173 mg/g creat (ref 0–200)

## 2023-01-15 LAB — HCV INTERPRETATION

## 2023-01-18 ENCOUNTER — Encounter: Payer: Self-pay | Admitting: Obstetrics and Gynecology

## 2023-01-18 DIAGNOSIS — O99810 Abnormal glucose complicating pregnancy: Secondary | ICD-10-CM | POA: Insufficient documentation

## 2023-01-18 MED ORDER — FERROUS SULFATE 325 (65 FE) MG PO TABS: 325.0000 mg | ORAL_TABLET | ORAL | 1 refills | Status: DC

## 2023-01-18 NOTE — Addendum Note (Signed)
Addended by: Aletha Halim on: 01/18/2023 11:00 AM   Modules accepted: Orders

## 2023-01-20 ENCOUNTER — Ambulatory Visit: Payer: No Typology Code available for payment source | Admitting: Clinical

## 2023-01-20 DIAGNOSIS — Z91199 Patient's noncompliance with other medical treatment and regimen due to unspecified reason: Secondary | ICD-10-CM

## 2023-01-27 ENCOUNTER — Ambulatory Visit: Payer: Medicaid Other

## 2023-01-27 ENCOUNTER — Telehealth (INDEPENDENT_AMBULATORY_CARE_PROVIDER_SITE_OTHER): Payer: Medicaid Other | Admitting: Obstetrics and Gynecology

## 2023-01-27 DIAGNOSIS — Z86711 Personal history of pulmonary embolism: Secondary | ICD-10-CM

## 2023-01-27 DIAGNOSIS — E663 Overweight: Secondary | ICD-10-CM

## 2023-01-27 DIAGNOSIS — O09292 Supervision of pregnancy with other poor reproductive or obstetric history, second trimester: Secondary | ICD-10-CM

## 2023-01-27 DIAGNOSIS — Z98891 History of uterine scar from previous surgery: Secondary | ICD-10-CM

## 2023-01-27 DIAGNOSIS — O09522 Supervision of elderly multigravida, second trimester: Secondary | ICD-10-CM

## 2023-01-27 DIAGNOSIS — O34219 Maternal care for unspecified type scar from previous cesarean delivery: Secondary | ICD-10-CM

## 2023-01-27 DIAGNOSIS — O43211 Placenta accreta, first trimester: Secondary | ICD-10-CM

## 2023-01-27 DIAGNOSIS — Z6841 Body Mass Index (BMI) 40.0 and over, adult: Secondary | ICD-10-CM

## 2023-01-27 DIAGNOSIS — Z83711 Family history of hyperplastic colon polyps: Secondary | ICD-10-CM

## 2023-01-27 DIAGNOSIS — Z3A16 16 weeks gestation of pregnancy: Secondary | ICD-10-CM

## 2023-01-27 DIAGNOSIS — O43212 Placenta accreta, second trimester: Secondary | ICD-10-CM

## 2023-01-27 DIAGNOSIS — Z8759 Personal history of other complications of pregnancy, childbirth and the puerperium: Secondary | ICD-10-CM

## 2023-01-27 DIAGNOSIS — O99212 Obesity complicating pregnancy, second trimester: Secondary | ICD-10-CM

## 2023-01-27 DIAGNOSIS — Z8632 Personal history of gestational diabetes: Secondary | ICD-10-CM

## 2023-01-27 DIAGNOSIS — O99891 Other specified diseases and conditions complicating pregnancy: Secondary | ICD-10-CM

## 2023-01-27 DIAGNOSIS — O0991 Supervision of high risk pregnancy, unspecified, first trimester: Secondary | ICD-10-CM

## 2023-01-27 MED ORDER — PRENATAL PLUS 27-1 MG PO TABS: 1.0000 | ORAL_TABLET | Freq: Every day | ORAL | 11 refills | Status: DC

## 2023-01-27 MED ORDER — PROMETHAZINE HCL 25 MG PO TABS: 25.0000 mg | ORAL_TABLET | Freq: Four times a day (QID) | ORAL | 0 refills | Status: DC | PRN

## 2023-01-27 NOTE — Progress Notes (Signed)
OBSTETRICS PRENATAL VIRTUAL VISIT ENCOUNTER NOTE  Provider location: [facility]   Patient location: Home  I connected with Anne Richardson on 01/27/23 at  3:55 PM EST by MyChart Video Encounter and verified that I am speaking with the correct person using two identifiers. I discussed the limitations, risks, security and privacy concerns of performing an evaluation and management service virtually and the availability of in person appointments. I also discussed with the patient that there may be a patient responsible charge related to this service. The patient expressed understanding and agreed to proceed. Subjective:  Anne Richardson is a 37 y.o. V6418507 at [redacted]w[redacted]d being seen today for ongoing prenatal care.  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  Patient reports no complaints.  Contractions: Not present. Vag. Bleeding: None.   . Denies any leaking of fluid.   The following portions of the patient's history were reviewed and updated as appropriate: allergies, current medications, past family history, past medical history, past social history, past surgical history and problem list.   Objective:  There were no vitals filed for this visit.  Fetal Status:           General:  Alert, oriented and cooperative. Patient is in no acute distress.  Respiratory: Normal  respiratory effort, no problems with respiration noted  Mental Status: Normal mood and affect. Normal behavior. Normal judgment and thought content.  Rest of physical exam deferred due to type of encounter  Imaging: [place] MFM OB Transvaginal  Result Date: 01/13/2023 ----------------------------------------------------------------------  OBSTETRICS REPORT                       (Signed Final 01/13/2023 01:57 pm) ---------------------------------------------------------------------- Patient Info  ID #:       [id number]                          D.O.B.:  [birth year] (36 yrs)  Name:       Anne Richardson [facility]             Visit Date: 01/13/2023 12:23 pm ---------------------------------------------------------------------- Performed By  Attending:        Valeda Malm DO       Ref. Address:     [address]                                                             [place], [place]  Performed  By:     Milus Glazier,      [facility]                                     [facility] at                                                             [facility]                                                             [facility]  Referred By:      Aletha Halim MD ---------------------------------------------------------------------- Orders  #  Description                           Code        Ordered By  1  [place] MFM OB TRANSVAGINAL                [phone]     [facility]  2  [place] MFM OB LIMITED                     76815.01    [facility] ----------------------------------------------------------------------  #  Order #                     Accession #                Episode #  1  [id number]                   [id number]                 [id number]  2  [id number]                   [id number]                 [id number] ---------------------------------------------------------------------- Indications  [redacted]  weeks gestation of pregnancy                Z3A.14  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           O43.212  Previous cesarean delivery, antepartum (x4)    T55.732  Obesity complicating pregnancy                 O99.210 E66.9  Gestational diabetes in pregnancy, diet        O24.410  controlled  Chronic Hepatitis C complicating pregnancy,    O98.419 B18.2  antepartum  Pulmonary embolism affecting pregnancy ,       O88.219  h/o ---------------------------------------------------------------------- Fetal Evaluation  Num Of Fetuses:         1  Fetal Heart Rate(bpm):  155  Cardiac Activity:       Observed  Presentation:           Breech  Placenta:               Possible Anterior previa and accreta  P. Cord Insertion:      Not well visualized  Amniotic Fluid  AFI FV:      Within normal limits                              Largest Pocket(cm)                              4.92 ---------------------------------------------------------------------- Biometry  BPD:      27.4  mm     G. Age:  14w 6d                  CI:        71.24   %    70 - 86                                                          FL/HC:      15.4   %    15.3 - 17.1  HC:      103.4  mm     G. Age:  14w 6d                  HC/AC:      1.18        1.05 - 1.39  AC:         88  mm     G. Age:  15w 0d                  FL/BPD:     58.0   %  FL:       15.9  mm     G. Age:  14w 5d                  FL/AC:      18.1   %    20 - 24  HUM:      16.6  mm     G. Age:  14w 5d  Est. FW:     109  gm      0 lb 4 oz ---------------------------------------------------------------------- OB History  Gravidity:    8         Term:   3        Prem:   1        SAB:   3  TOP:          0        Living:  5 ---------------------------------------------------------------------- Gestational Age  LMP:           14w 4d        Date:  10/03/22                 EDD:   07/10/23  U/S Today:     14w 6d  EDD:   07/08/23  Best:          14w 4d     Det. By:  LMP   (10/03/22)          EDD:   07/10/23 ---------------------------------------------------------------------- Anatomy  Cranium:               Appears normal         Upper Extremities:      Visualized  Stomach:               Appears normal, left   Lower Extremities:      Visualized                         sided  Bladder:               Appears normal ---------------------------------------------------------------------- Cervix Uterus Adnexa  Cervix  Normal appearance by transvaginal scan  Right Ovary  Simple cyst, measuring  Left Ovary  Within normal limits. 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 Her pregnancy is also  complicated by a hx of a PE (on Lovenox now), HCV and  obesity. EDD: 07/10/2023 dated by LMP  (10/03/22). She has  no concerns today and denies pelvic pain, cramping or  bleeding. She desires to continue the pregnancy. She is  compliant with lovenox for a hx of pulmonary embolism.  Sonographic findings  Single intrauterine pregnancy.  Fetal cardiac activity:  Observed and appears normal.  Presentation: Breech.  Interval fetal anatomy appears normal.  Fetal biometry shows the estimated fetal weight at the 59  percentile.  Amniotic fluid volume: Within normal limits. MVP: 4.92 cm.  Placenta: Anterior previa and likely accreta. Numerous  abnormal lacunae in the midline of the placenta around the  previous cesarean scar. Retroplacental thinning with  increased vascularity is also seen. The bladder is not full so it  is difficult to see bridging vessels.  Plan  - The patient had to reschedule her consult at [facility]  due to child care issues. She will follow-up with them and see  [place] again in two weeks. ----------------------------------------------------------------------                  Valeda Malm, DO Electronically Signed Final Report   01/13/2023 01:57 pm  ----------------------------------------------------------------------  [place] MFM OB LIMITED  Result Date: 01/13/2023 ----------------------------------------------------------------------  OBSTETRICS REPORT                       (Signed Final 01/13/2023 01:57 pm) ---------------------------------------------------------------------- Patient Info  ID #:       [id number]                          D.O.B.:  14-Apr-1986 (36 yrs)  Name:       Anne Richardson [facility]             Visit Date: 01/13/2023 12:23 pm ---------------------------------------------------------------------- Performed By  Attending:        Valeda Malm DO       Ref. Address:     [address]  [place], [place]  Performed By:     Milus Glazier,      [facility]                                     Fetal Care at                                                             [place] for                                                             Women  Referred By:      Aletha Halim MD ---------------------------------------------------------------------- Orders  #  Description                           Code        Ordered By  1  [place] MFM OB TRANSVAGINAL                [id number]     [facility]  2  [place] MFM OB LIMITED                     76815.01    [facility] ----------------------------------------------------------------------  #  Order #                     Accession #                Episode #  1  [id number]                   [id number]                 [id number]  2  [id number]                   [id number]                 [id number] ---------------------------------------------------------------------- Indications  [redacted] weeks gestation of pregnancy                Z3A.14  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           O43.212  Previous cesarean delivery, antepartum (x4)     [id number]  Obesity complicating pregnancy  O99.210 E66.9  Gestational diabetes in pregnancy, diet        O24.410  controlled  Chronic Hepatitis C complicating pregnancy,    O98.419 B18.2  antepartum  Pulmonary embolism affecting pregnancy ,       O88.219  h/o ---------------------------------------------------------------------- Fetal Evaluation  Num Of Fetuses:         1  Fetal Heart Rate(bpm):  155  Cardiac Activity:       Observed  Presentation:           Breech  Placenta:               Possible Anterior previa and accreta  P. Cord Insertion:      Not well visualized  Amniotic Fluid  AFI FV:      Within normal limits                              Largest Pocket(cm)                              4.92 ---------------------------------------------------------------------- Biometry  BPD:      27.4  mm     G. Age:  14w 6d                  CI:        71.24   %    70 - 86                                                          FL/HC:      15.4   %    15.3 - 17.1  HC:      103.4  mm     G. Age:  14w 6d                  HC/AC:      1.18        1.05 - 1.39  AC:         88  mm     G. Age:  15w 0d                  FL/BPD:     58.0   %  FL:       15.9  mm     G. Age:  14w 5d                  FL/AC:      18.1   %    20 - 24  HUM:      16.6  mm     G. Age:  14w 5d  Est. FW:     109  gm      0 lb 4 oz ---------------------------------------------------------------------- OB History  Gravidity:    8         Term:   3        Prem:   1        SAB:   3  TOP:          0        Living:  5 ---------------------------------------------------------------------- Gestational Age  LMP:  14w 4d        Date:  10/03/22                 EDD:   07/10/23  U/S Today:     14w 6d                                        EDD:   07/08/23  Best:          14w 4d     Det. By:  LMP  (10/03/22)          EDD:   07/10/23 ---------------------------------------------------------------------- Anatomy  Cranium:               Appears normal          Upper Extremities:      Visualized  Stomach:               Appears normal, left   Lower Extremities:      Visualized                         sided  Bladder:               Appears normal ---------------------------------------------------------------------- Cervix Uterus Adnexa  Cervix  Normal appearance by transvaginal scan  Right Ovary  Simple cyst, measuring  Left Ovary  Within normal limits. 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 Her pregnancy is also  complicated by a hx of a PE (on Lovenox now), HCV and  obesity. EDD: 07/10/2023 dated by LMP  (10/03/22). She has  no concerns today and denies pelvic pain, cramping or  bleeding. She desires to continue the pregnancy. She is  compliant with lovenox for a hx of pulmonary embolism.  Sonographic findings  Single intrauterine pregnancy.  Fetal cardiac activity:  Observed and appears normal.  Presentation: Breech.  Interval fetal anatomy appears normal.  Fetal biometry shows the estimated fetal weight at the 59  percentile.  Amniotic fluid volume: Within normal limits. MVP: 4.92 cm.  Placenta: Anterior previa and likely accreta. Numerous  abnormal lacunae in the midline of the placenta around the  previous cesarean scar. Retroplacental thinning with  increased vascularity is also seen. The bladder is not full so it  is difficult to see bridging vessels.  Plan  - The patient had to reschedule her consult at [facility]  due to child care issues. She will follow-up with them and see  [place] again in two weeks. ----------------------------------------------------------------------                  Valeda Malm, DO Electronically Signed Final Report   01/13/2023 01:57 pm ----------------------------------------------------------------------  [place] MFM OB Transvaginal  Result Date:  01/01/2023 ----------------------------------------------------------------------  OBSTETRICS REPORT                       (Signed Final 01/01/2023 11:31 am) ---------------------------------------------------------------------- Patient Info  ID #:       [id number]                          D.O.B.:  09/12/1986 (36 yrs)  Name:       Anne Richardson  Visit Date: 12/31/2022 05:01 pm ---------------------------------------------------------------------- Performed By  Attending:        Braxton Feathers DO       Ref. Address:     [address]                                                             [place], [place]                                                             [address]  Performed By:     Marcellina Millin       [facility]                                     [facility] at                                                             [facility]                                                             Women  Referred By:       Bing MD ---------------------------------------------------------------------- Orders  #  Description                           Code        Ordered By  1  [place] MFM OB TRANSVAGINAL                [phone]     [facility]  2  [place] MFM OB LIMITED                     76815.01    [facility] ----------------------------------------------------------------------  #  Order #                     Accession #                Episode #  1  [id number]                   [id number]                 [id number]  2  [id number]  [id number]                 [id number] ---------------------------------------------------------------------- Indications  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           O43.212  Previous cesarean delivery, antepartum (x4)    O34.219  Evaluation for a suspected ectopic             Z32.00  pregnancy  [redacted] weeks gestation of pregnancy                Z3A.12  ---------------------------------------------------------------------- Fetal Evaluation  Num Of Fetuses:         1  Preg. Location:         Intrauterine  Gest. Sac:              Intrauterine  Fetal Pole:             Visualized  Fetal Heart Rate(bpm):  145  Cardiac Activity:       Observed  Placenta:               Possible anterior previa  Amniotic Fluid  AFI FV:      Within normal limits ---------------------------------------------------------------------- Biometry  CRL:      62.6  mm     G. Age:  12w 4d                  EDD:   07/11/23 ---------------------------------------------------------------------- OB History  Gravidity:    8         Term:   3        Prem:   1        SAB:   3  TOP:          0        Living:  5 ---------------------------------------------------------------------- Gestational Age  LMP:           12w 5d        Date:  10/03/22                 EDD:   07/10/23  Best:          12w 5d     Det. By:  LMP  (10/03/22)          EDD:   07/10/23 ---------------------------------------------------------------------- Cervix Uterus Adnexa  Right Ovary  Size(cm)     8.41   x   5.09   x  4.98      Vol(ml): 111.62  Multiple cysts, largest 4 x 3.9 x 3.8cm ---------------------------------------------------------------------- Comments  MFM Consult Note- See Epic for full consult note  Patient Name: Anne Richardson  Patient MRN:   [id number]  Referring provider: [facility]  Reason for Consult: Placenta accreta spectrum  HPI: LEXINGTON ELDRED is a 37 y.o. V6418507 at [redacted]w[redacted]d  by last menstrual period consistent with early ultrasound here  as an add on consult.  Sonographic findngs  Single intrauterine pregnancy at  12w 5d with EDD of  07/10/2023 dated by LMP  (10/03/22).  Fetal cardiac activity:  Observed and appears normal.  Very limited anatomy but no major anomalies are seen.  Amniotic fluid volume: Within normal limits.  Placenta: anterior with possible previa. A persistent uterine  contraction was  noted in the posterior segment which may  have artificially drawn the placenta closer to the internal os.  The majority of the pegnancy occupies the uterine corpus,  however, there is concern for placenta accreta spectrum or  an "on the niche"  cesarean scar ectopic pregnancy. Earlier  ultrasounds show a buldging of decidual tissue into the area  of the scar near the cervix. On today's ultrasound there is  hypervascularity and a thinning of the retroplacental clear  space in the midline of the uterus near the cervix. The  placenta appears to have hypervascularity around this area  as well. There are a few irregular shaped lacuna in the  placenta near the cesarean scar. Due to a persistent  contraction, it is difficult to know if the placenta is covering  the internal os or if it is pulled over the os due to the  contraction.There is no evidence of placental invasion  through the bladder or pelvic sidewall. 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  Assessment  1.  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  -Based on today's ultrasound there is a moderate risk for  placenta accreta spectrum based on the presence of multiple  irregular lacunae, thinning of the myometrium in the  retroplacental space, hypervascularity near the lower uterine  segment adjacent to the cesarean scar and anterior placenta  with possible previa.  The majority of the gestational sac  occupies the uterine corpus making this more likely an on the  niche caesarean scar pregnancy/PAS.  -I counseled the patient about the natural progression and  complications of a placenta accreta spectrum pregnancy.  We discussed the increased risk of mortality and maternal  morbidity.  I discussed the need for frequent ultrasounds and  possibly an MRI later in the pregnancy to help aid in the  diagnosis and surgical planning.  We  discussed that this  typically ends in a hysterectomy after the baby is delivered.  We discussed that delivery timing will be pending her clinical  course but is often around 34 weeks or sooner if she has  significant pain or bleeding.  I discussed the red flag  symptoms on when to return to the hospital.  I discussed that  pregnancies like this are best managed at facilities with  significant experience and I recommend she have a  consultation with the maternal-fetal medicine department at  [facility] in [place].  She verbalized  understanding and request to proceed with the plan as  outlined.  -MFM consult to [facility], I have contacted the nurse  navigator and sent a referral to have the patient seen in the  next week  -Follow-up ultrasound in 2 weeks in our office to confirm  diagnosis  -Red flag symptoms discussed  2.  History of pulmonary embolism  -The patient will likely need to be on anticoagulation during  the pregnancy.  -Due to the limited time of our visit we did not go into depth  discussing this problem.  She has been counseled by her OB  provider and the plan is to resume anticoagulation.  3. Multicystic ovary  -The right ovary measures around 8x6x5cm with multiple  small anechoic cysts with thin walls.  -Continue to assess on future ultrasounds  60 minutes was spent in consultation with the patient  including imaging review, chart review and patient  counseling.  Thank you for the opportunity to be involved with  this patient's care. Please let us know if we can be of any  further assistance.  Valeda Malm  MFM, [place] ----------------------------------------------------------------------  Valeda Malm, DO Electronically Signed Final Report   01/01/2023 11:31 am ----------------------------------------------------------------------  [place] MFM OB LIMITED  Result Date: 01/01/2023 ----------------------------------------------------------------------   OBSTETRICS REPORT                       (Signed Final 01/01/2023 11:31 am) ---------------------------------------------------------------------- Patient Info  ID #:       [id number]                          D.O.B.:  1986-11-07 (36 yrs)  Name:       Anne Richardson [facility]             Visit Date: 12/31/2022 05:01 pm ---------------------------------------------------------------------- Performed By  Attending:        Valeda Malm DO       Ref. Address:     [address]                                                             [place], [place]  Performed By:     Nathen May       [facility]         [facility]                                     Fetal Care at                                                             [facility]                                                             Women  Referred By:      Aletha Halim MD ---------------------------------------------------------------------- Orders  #  Description                           Code        Ordered By  1  [place] MFM OB TRANSVAGINAL                [id number]     [facility]  2  [place] MFM OB LIMITED  [id number]    [facility] ----------------------------------------------------------------------  #  Order #                     Accession #                Episode #  1  [id number]                   [id number]                 [id number]  2  [id number]                   [id number]                 [id number] ---------------------------------------------------------------------- Indications  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           O43.212  Previous cesarean delivery, antepartum (x4)    O34.219  Evaluation for a suspected ectopic             Z32.00  pregnancy  [redacted] weeks gestation of pregnancy                Z3A.12 ---------------------------------------------------------------------- Fetal Evaluation  Num Of Fetuses:          1  Preg. Location:         Intrauterine  Gest. Sac:              Intrauterine  Fetal Pole:             Visualized  Fetal Heart Rate(bpm):  145  Cardiac Activity:       Observed  Placenta:               Possible anterior previa  Amniotic Fluid  AFI FV:      Within normal limits ---------------------------------------------------------------------- Biometry  CRL:      62.6  mm     G. Age:  12w 4d                  EDD:   07/11/23 ---------------------------------------------------------------------- OB History  Gravidity:    8         Term:   3        Prem:   1        SAB:   3  TOP:          0        Living:  5 ---------------------------------------------------------------------- Gestational Age  LMP:           12w 5d        Date:  10/03/22                 EDD:   07/10/23  Best:          12w 5d     Det. By:  LMP  (10/03/22)          EDD:   07/10/23 ---------------------------------------------------------------------- Cervix Uterus Adnexa  Right Ovary  Size(cm)     8.41   x   5.09   x  4.98      Vol(ml): 111.62  Multiple cysts, largest 4 x 3.9 x 3.8cm ---------------------------------------------------------------------- Comments  MFM Consult Note- See Epic for full consult note  Patient Name: Anne Richardson  Patient MRN:   [id number]  Referring provider: [facility]  Reason for Consult: Placenta accreta spectrum  HPI: OLGA BAPST is a 37 y.o. [id number] at [redacted]w[redacted]d  by last menstrual period consistent  with early ultrasound here  as an add on consult.  Sonographic findngs  Single intrauterine pregnancy at  12w 5d with EDD of  07/10/2023 dated by LMP  (10/03/22).  Fetal cardiac activity:  Observed and appears normal.  Very limited anatomy but no major anomalies are seen.  Amniotic fluid volume: Within normal limits.  Placenta: anterior with possible previa. A persistent uterine  contraction was noted in the posterior segment which may  have artificially drawn the placenta closer to the internal os.  The  majority of the pegnancy occupies the uterine corpus,  however, there is concern for placenta accreta spectrum or  an "on the niche" cesarean scar ectopic pregnancy. Earlier  ultrasounds show a buldging of decidual tissue into the area  of the scar near the cervix. On today's ultrasound there is  hypervascularity and a thinning of the retroplacental clear  space in the midline of the uterus near the cervix. The  placenta appears to have hypervascularity around this area  as well. There are a few irregular shaped lacuna in the  placenta near the cesarean scar. Due to a persistent  contraction, it is difficult to know if the placenta is covering  the internal os or if it is pulled over the os due to the  contraction.There is no evidence of placental invasion  through the bladder or pelvic sidewall. 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  Assessment  1.  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  -Based on today's ultrasound there is a moderate risk for  placenta accreta spectrum based on the presence of multiple  irregular lacunae, thinning of the myometrium in the  retroplacental space, hypervascularity near the lower uterine  segment adjacent to the cesarean scar and anterior placenta  with possible previa.  The majority of the gestational sac  occupies the uterine corpus making this more likely an on the  niche caesarean scar pregnancy/PAS.  -I counseled the patient about the natural progression and  complications of a placenta accreta spectrum pregnancy.  We discussed the increased risk of mortality and maternal  morbidity.  I discussed the need for frequent ultrasounds and  possibly an MRI later in the pregnancy to help aid in the  diagnosis and surgical planning.  We discussed that this  typically ends in a hysterectomy after the baby is delivered.  We discussed that delivery  timing will be pending her clinical  course but is often around 34 weeks or sooner if she has  significant pain or bleeding.  I discussed the red flag  symptoms on when to return to the hospital.  I discussed that  pregnancies like this are best managed at facilities with  significant experience and I recommend she have a  consultation with the maternal-fetal medicine department at  [facility] in [place].  She verbalized  understanding and request to proceed with the plan as  outlined.  -MFM consult to [facility], I have contacted the nurse  navigator and sent a referral to have the patient seen in the  next week  -Follow-up ultrasound in 2 weeks in our office to confirm  diagnosis  -Red flag symptoms discussed  2.  History of pulmonary embolism  -The patient will likely need to be on anticoagulation during  the pregnancy.  -Due to the limited time of our visit we did not  go into depth  discussing this problem.  She has been counseled by her OB  provider and the plan is to resume anticoagulation.  3. Multicystic ovary  -The right ovary measures around 8x6x5cm with multiple  small anechoic cysts with thin walls.  -Continue to assess on future ultrasounds  60 minutes was spent in consultation with the patient  including imaging review, chart review and patient  counseling.  Thank you for the opportunity to be involved with  this patient's care. Please let us know if we can be of any  further assistance.  Valeda Malm  MFM, [place] ----------------------------------------------------------------------                  Valeda Malm, DO Electronically Signed Final Report   01/01/2023 11:31 am ----------------------------------------------------------------------   Assessment and Plan:  Pregnancy: [id number] at [redacted]w[redacted]d 1. Supervision of high risk pregnancy in first trimester Continue routine prenatal care  - prenatal vitamin w/FE, FA (PRENATAL 1 + 1) 27-1 MG TABS tablet; Take 1 tablet by mouth  daily at 12 noon.  Dispense: 30 tablet; Refill: 11 - promethazine (PHENERGAN) 25 MG tablet; Take 1 tablet (25 mg total) by mouth every 6 (six) hours as needed for nausea.  Dispense: 30 tablet; Refill: 0  2. [redacted] weeks gestation of pregnancy   3. 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  She was strongly urged to reschedule ASAP  4. Previous cesarean section   5. History of pulmonary embolism Pt currently on lovenox  6. History of gestational hypertension   7. History of gestational diabetes mellitus (GDM) Pt has early GTT scheduled  8. BMI 40.0-44.9, adult ([place])   9. Multigravida of advanced maternal age in second trimester   Preterm labor symptoms and general obstetric precautions including but not limited to vaginal bleeding, contractions, leaking of fluid and fetal movement were reviewed in detail with the patient. I discussed the assessment and treatment plan with the patient. The patient was provided an opportunity to ask questions and all were answered. The patient agreed with the plan and demonstrated an understanding of the instructions. The patient was advised to call back or seek an in-person office evaluation/go to [facility]U at [facility] for any urgent or concerning symptoms. Please refer to After Visit Summary for other counseling recommendations.   I provided 10 minutes of face-to-face time during this encounter.  Return in about 4 weeks (around 02/24/2023) for [facility], in person.  Future Appointments  Date Time Provider [place]  02/01/2023  8:50 AM [facility]  02/24/2023  4:15 PM Radene Gunning, MD [facility]    Griffin Basil, MD [facility], [place]

## 2023-01-27 NOTE — Progress Notes (Signed)
Pt states that she does not have a BP Cuff.

## 2023-01-28 ENCOUNTER — Other Ambulatory Visit: Payer: Self-pay | Admitting: *Deleted

## 2023-01-28 DIAGNOSIS — Z8632 Personal history of gestational diabetes: Secondary | ICD-10-CM

## 2023-01-28 DIAGNOSIS — O0990 Supervision of high risk pregnancy, unspecified, unspecified trimester: Secondary | ICD-10-CM

## 2023-02-01 ENCOUNTER — Other Ambulatory Visit: Payer: Medicaid Other

## 2023-02-01 ENCOUNTER — Other Ambulatory Visit: Payer: Self-pay

## 2023-02-01 DIAGNOSIS — Z8632 Personal history of gestational diabetes: Secondary | ICD-10-CM

## 2023-02-01 DIAGNOSIS — O0990 Supervision of high risk pregnancy, unspecified, unspecified trimester: Secondary | ICD-10-CM

## 2023-02-02 LAB — GLUCOSE TOLERANCE, 2 HOURS W/ 1HR
Glucose, 1 hour: 165 mg/dL (ref 70–179)
Glucose, 2 hour: 88 mg/dL (ref 70–152)
Glucose, Fasting: 84 mg/dL (ref 70–91)

## 2023-02-20 DIAGNOSIS — Z7189 Other specified counseling: Secondary | ICD-10-CM | POA: Insufficient documentation

## 2023-02-20 NOTE — Progress Notes (Unsigned)
   PRENATAL VISIT NOTE  Subjective:  Anne Richardson is a 37 y.o. [id number] at 29w4dbeing seen today for ongoing prenatal care.  She is currently monitored for the following issues for this high-risk pregnancy and has Obesity in pregnancy; Previous cesarean section; Schizoaffective disorder, bipolar type ([place]; PTSD (post-traumatic stress disorder); History of pulmonary embolism; AMA (advanced maternal age) multigravida 35+; BMI 40.0-44.9, adult ([place]; Supervision of high risk pregnancy in first trimester; UTI in pregnancy; History of gestational hypertension; History of gestational diabetes mellitus (GDM); Abnormal fetal ultrasound; 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; Abnormal glucose affecting pregnancy; and RED Chart Rounds pt on their problem list.  Patient reports no complaints.  Contractions: Not present. Vag. Bleeding: None.  Movement: Present. Denies leaking of fluid.   The following portions of the patient's history were reviewed and updated as appropriate: allergies, current medications, past family history, past medical history, past social history, past surgical history and problem list.   Objective:   Vitals:   02/24/23 1633  BP: 116/82  Pulse: 97  Weight: 299 lb 6.4 oz (135.8 kg)    Fetal Status: Fetal Heart Rate (bpm): 145   Movement: Present     General:  Alert, oriented and cooperative. Patient is in no acute distress.  Skin: Skin is warm and dry. No rash noted.   Cardiovascular: Normal heart rate noted  Respiratory: Normal respiratory effort, no problems with respiration noted  Abdomen: Soft, gravid, appropriate for gestational age.  Pain/Pressure: Present     Pelvic: Cervical exam deferred        Extremities: Normal range of motion.  Edema: Trace  Mental Status: Normal mood and affect. Normal behavior. Normal judgment and thought content.   Assessment and Plan:  Pregnancy: [id number] 251w4d. Abnormal glucose affecting pregnancy Early 2 hr was normal.   2.  Urinary tract infection in mother during first trimester of pregnancy TOC today   3. 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 By our imaging, concern for accreta. [facility] and [facility] did not see signs of PAS. She has rescan with [facility] on 3/20. Will continue to follow along.   4. History of gestational hypertension BP today wnl  5. History of pulmonary embolism She is on Lovenox prophylaxis.   6. Obesity in pregnancy  7. Previous cesarean section Will need repeat c-section but timing and location will depend on development of accreta.   8. Supervision of high risk pregnancy in first trimester Discussed MOC: She thinks she would like tubes removed (salpingectomy) if doesn't need c-hyst. Sign papers at 28w.   Preterm labor symptoms and general obstetric precautions including but not limited to vaginal bleeding, contractions, leaking of fluid and fetal movement were reviewed in detail with the patient. Please refer to After Visit Summary for other counseling recommendations.   Return in about 4 weeks (around 03/24/2023) for [facility] VISIT, MD only.  No future appointments.   PaRadene GunningMD

## 2023-02-24 ENCOUNTER — Other Ambulatory Visit: Payer: Self-pay

## 2023-02-24 ENCOUNTER — Encounter: Payer: Self-pay | Admitting: Obstetrics and Gynecology

## 2023-02-24 ENCOUNTER — Ambulatory Visit (INDEPENDENT_AMBULATORY_CARE_PROVIDER_SITE_OTHER): Payer: Medicaid Other | Admitting: Obstetrics and Gynecology

## 2023-02-24 VITALS — BP 116/82 | HR 97 | Wt 299.4 lb

## 2023-02-24 DIAGNOSIS — O0992 Supervision of high risk pregnancy, unspecified, second trimester: Secondary | ICD-10-CM

## 2023-02-24 DIAGNOSIS — Z3A20 20 weeks gestation of pregnancy: Secondary | ICD-10-CM

## 2023-02-24 DIAGNOSIS — O99810 Abnormal glucose complicating pregnancy: Secondary | ICD-10-CM

## 2023-02-24 DIAGNOSIS — O43212 Placenta accreta, second trimester: Secondary | ICD-10-CM

## 2023-02-24 DIAGNOSIS — Z8759 Personal history of other complications of pregnancy, childbirth and the puerperium: Secondary | ICD-10-CM

## 2023-02-24 DIAGNOSIS — O99212 Obesity complicating pregnancy, second trimester: Secondary | ICD-10-CM

## 2023-02-24 DIAGNOSIS — Z86711 Personal history of pulmonary embolism: Secondary | ICD-10-CM

## 2023-02-24 DIAGNOSIS — O2341 Unspecified infection of urinary tract in pregnancy, first trimester: Secondary | ICD-10-CM

## 2023-02-24 DIAGNOSIS — O0991 Supervision of high risk pregnancy, unspecified, first trimester: Secondary | ICD-10-CM

## 2023-02-24 DIAGNOSIS — Z7189 Other specified counseling: Secondary | ICD-10-CM

## 2023-02-24 DIAGNOSIS — Z98891 History of uterine scar from previous surgery: Secondary | ICD-10-CM

## 2023-02-24 DIAGNOSIS — O2342 Unspecified infection of urinary tract in pregnancy, second trimester: Secondary | ICD-10-CM

## 2023-02-24 DIAGNOSIS — O99210 Obesity complicating pregnancy, unspecified trimester: Secondary | ICD-10-CM

## 2023-03-04 ENCOUNTER — Other Ambulatory Visit: Payer: Self-pay

## 2023-03-04 ENCOUNTER — Encounter (HOSPITAL_COMMUNITY): Payer: Self-pay | Admitting: Obstetrics and Gynecology

## 2023-03-04 ENCOUNTER — Inpatient Hospital Stay (HOSPITAL_COMMUNITY)
Admission: AD | Admit: 2023-03-04 | Discharge: 2023-03-04 | Disposition: A | Discharge status: Home / Self Care | Payer: Medicaid Other | Attending: Obstetrics and Gynecology | Admitting: Obstetrics and Gynecology

## 2023-03-04 ENCOUNTER — Inpatient Hospital Stay (HOSPITAL_COMMUNITY): Payer: Medicaid Other

## 2023-03-04 DIAGNOSIS — Z86711 Personal history of pulmonary embolism: Secondary | ICD-10-CM | POA: Insufficient documentation

## 2023-03-04 DIAGNOSIS — R109 Unspecified abdominal pain: Secondary | ICD-10-CM | POA: Diagnosis not present

## 2023-03-04 DIAGNOSIS — R519 Headache, unspecified: Secondary | ICD-10-CM | POA: Insufficient documentation

## 2023-03-04 DIAGNOSIS — O26892 Other specified pregnancy related conditions, second trimester: Secondary | ICD-10-CM

## 2023-03-04 DIAGNOSIS — N83201 Unspecified ovarian cyst, right side: Secondary | ICD-10-CM | POA: Diagnosis not present

## 2023-03-04 DIAGNOSIS — R1031 Right lower quadrant pain: Secondary | ICD-10-CM | POA: Diagnosis present

## 2023-03-04 DIAGNOSIS — O09522 Supervision of elderly multigravida, second trimester: Secondary | ICD-10-CM | POA: Insufficient documentation

## 2023-03-04 DIAGNOSIS — Z3A21 21 weeks gestation of pregnancy: Secondary | ICD-10-CM

## 2023-03-04 DIAGNOSIS — Z3492 Encounter for supervision of normal pregnancy, unspecified, second trimester: Secondary | ICD-10-CM

## 2023-03-04 DIAGNOSIS — O3482 Maternal care for other abnormalities of pelvic organs, second trimester: Secondary | ICD-10-CM | POA: Insufficient documentation

## 2023-03-04 DIAGNOSIS — O26899 Other specified pregnancy related conditions, unspecified trimester: Secondary | ICD-10-CM

## 2023-03-04 LAB — URINALYSIS, ROUTINE W REFLEX MICROSCOPIC
Bilirubin Urine: NEGATIVE
Glucose, UA: NEGATIVE mg/dL
Ketones, ur: NEGATIVE mg/dL
Leukocytes,Ua: NEGATIVE
Nitrite: NEGATIVE
Protein, ur: NEGATIVE mg/dL
Specific Gravity, Urine: 1.02 (ref 1.005–1.030)
pH: 6 (ref 5.0–8.0)

## 2023-03-04 LAB — COMPREHENSIVE METABOLIC PANEL
ALT: 26 U/L (ref 0–44)
AST: 23 U/L (ref 15–41)
Albumin: 2.6 g/dL — ABNORMAL LOW (ref 3.5–5.0)
Alkaline Phosphatase: 58 U/L (ref 38–126)
Anion gap: 10 (ref 5–15)
BUN: 6 mg/dL (ref 6–20)
CO2: 20 mmol/L — ABNORMAL LOW (ref 22–32)
Calcium: 9.4 mg/dL (ref 8.9–10.3)
Chloride: 104 mmol/L (ref 98–111)
Creatinine, Ser: 0.57 mg/dL (ref 0.44–1.00)
GFR, Estimated: >60 mL/min (ref >60)
Glucose, Bld: 105 mg/dL — ABNORMAL HIGH (ref 70–99)
Potassium: 3.7 mmol/L (ref 3.5–5.1)
Sodium: 134 mmol/L — ABNORMAL LOW (ref 135–145)
Total Bilirubin: 0.4 mg/dL (ref 0.3–1.2)
Total Protein: 6.3 g/dL — ABNORMAL LOW (ref 6.5–8.1)

## 2023-03-04 LAB — CBC
HCT: 35.4 % — ABNORMAL LOW (ref 36.0–46.0)
Hemoglobin: 11.7 g/dL — ABNORMAL LOW (ref 12.0–15.0)
MCH: 28.9 pg (ref 26.0–34.0)
MCHC: 33.1 g/dL (ref 30.0–36.0)
MCV: 87.4 fL (ref 80.0–100.0)
Platelets: 260 10*3/uL (ref 150–400)
RBC: 4.05 MIL/uL (ref 3.87–5.11)
RDW: 18.7 % — ABNORMAL HIGH (ref 11.5–15.5)
WBC: 14.2 10*3/uL — ABNORMAL HIGH (ref 4.0–10.5)
nRBC: 0 % (ref 0.0–0.2)

## 2023-03-04 MED ORDER — SIMETHICONE 80 MG PO CHEW
80.0000 mg | CHEWABLE_TABLET | Freq: Once | ORAL | Status: AC
  Administered 2023-03-04: 80 mg via ORAL
  Filled 2023-03-04: qty 1

## 2023-03-04 MED ORDER — LACTATED RINGERS IV SOLN: Freq: Once | INTRAVENOUS | Status: AC

## 2023-03-04 MED ORDER — FENTANYL CITRATE (PF) 100 MCG/2ML IJ SOLN
50.0000 ug | Freq: Once | INTRAMUSCULAR | Status: AC
  Administered 2023-03-04: 50 ug via INTRAVENOUS
  Filled 2023-03-04: qty 2

## 2023-03-04 MED ORDER — CYCLOBENZAPRINE HCL 5 MG PO TABS
10.0000 mg | ORAL_TABLET | Freq: Once | ORAL | Status: AC
  Administered 2023-03-04: 10 mg via ORAL
  Filled 2023-03-04: qty 2

## 2023-03-04 MED ORDER — ACETAMINOPHEN-CAFFEINE 500-65 MG PO TABS
2.0000 | ORAL_TABLET | Freq: Once | ORAL | Status: AC
  Administered 2023-03-04: 2 via ORAL
  Filled 2023-03-04: qty 2

## 2023-03-04 MED ORDER — DIPHENHYDRAMINE HCL 50 MG/ML IJ SOLN
25.0000 mg | Freq: Once | INTRAMUSCULAR | Status: AC
  Administered 2023-03-04: 25 mg via INTRAVENOUS
  Filled 2023-03-04: qty 1

## 2023-03-04 MED ORDER — HYOSCYAMINE SULFATE 0.125 MG SL SUBL
0.2500 mg | SUBLINGUAL_TABLET | Freq: Once | SUBLINGUAL | Status: AC
  Administered 2023-03-04: 0.25 mg via SUBLINGUAL
  Filled 2023-03-04: qty 2

## 2023-03-04 MED ORDER — METOCLOPRAMIDE HCL 5 MG/ML IJ SOLN
10.0000 mg | Freq: Once | INTRAMUSCULAR | Status: AC
  Administered 2023-03-04: 10 mg via INTRAVENOUS
  Filled 2023-03-04: qty 2

## 2023-03-04 NOTE — MAU Provider Note (Addendum)
Chief Complaint:  Abdominal Pain and Headache   Event Date/Time   First Provider Initiated Contact with Patient 03/04/23 0238     HPI: Anne Richardson is a 37 y.o. [id number] at 66w5dho presents to maternity admissions reporting abdominal pain which started at 6pm around navel and spreading to right lower abdomen.  Unable to sleep through the pain. Also has a headache which seems to be in sync with pattern of abdominal pain.  Does have light sensitivity also. Has nausea and has had multiple loose stools today. No anorexia. Requests to eat [employer] which husband just picked up. . She denies LOF, vaginal bleeding, urinary symptoms, h/a, dizziness, constipation or fever/chills.    Abdominal Pain This is a new problem. The current episode started today. The problem has been unchanged. The pain is located in the periumbilical region and RLQ. The quality of the pain is cramping and sharp. Associated symptoms include diarrhea, headaches and nausea. Pertinent negatives include no anorexia, constipation, dysuria, fever, frequency, myalgias or vomiting. The pain is aggravated by palpation. The pain is relieved by Nothing. She has tried nothing for the symptoms.  Headache  This is a new problem. The current episode started today. The problem occurs constantly. The problem has been unchanged. The quality of the pain is described as aching. Associated symptoms include abdominal pain, nausea and photophobia. Pertinent negatives include no anorexia, dizziness, fever or vomiting. The symptoms are aggravated by bright light. She has tried nothing for the symptoms.   RN Note: Anne BOOTENis a 37y.o. at 244w5dere in MAU reporting: yesterday evening around 1800 [place] have spread to right side that are here and there but [place]. Reports HA and stating "the same cramps I'm feeling in my stomach I feel in my brain". Denies VB, LOF, or abnormal  discharge. Endorses FM. Took tylenol around 1800 - "it didn't do anything"    Pain score: 10 - abdomen; 6 - HA Past Medical History: Past Medical History:  Diagnosis Date   Anxiety    Chlamydia    Depression    doing good now   Gestational diabetes    Gonorrhea    Insufficient prenatal care in third trimester 07/20/2017   Onset of care at 38w   Pregnancy induced hypertension    Pulmonary embolism (HCC)     Past obstetric history: OB History  Gravida Para Term Preterm AB Living  '8 4 3 1 3 5  '$ SAB IAB Ectopic Multiple Live Births  3 0   1 5    # Outcome Date GA Lbr Len/2nd Weight Sex Delivery Anes PTL Lv  8 Current           7 Term 07/24/17 [id number] g F CS-LTranv Spinal  LIV     Birth Comments: wnl  6A Preterm 03/21/16 35w47w5d00 g M CS-LTranv Spinal  LIV  6B Preterm 03/21/16 35w535w5d0 g M CS-LTranv Spinal  LIV  5 Term 06/28/12 6w5d55w5d g F CS-LTranv Spinal  LIV  4 Term 12/13/10   3544 g F CS-LTranv   LIV     Birth Comments: high blood pressure, fever, meconium  3 SAB 12/05/09          2 SAB           1 SAB             Past Surgical History:  Past Surgical History:  Procedure Laterality Date   BIOPSY BREAST     BREAST LUMPECTOMY     left   CESAREAN SECTION     CESAREAN SECTION  06/28/2012   Procedure: CESAREAN SECTION;  Surgeon: Guss Bunde, MD;  Location: [facility];  Service: Gynecology;  Laterality: N/A;   CESAREAN SECTION N/A 03/21/2016   Procedure: CESAREAN SECTION;  Surgeon: Florian Buff, MD;  Location: [facility];  Service: Obstetrics;  Laterality: N/A;   CESAREAN SECTION N/A 07/24/2017   Procedure: CESAREAN SECTION;  Surgeon: Chancy Milroy, MD;  Location: [facility];  Service: Obstetrics;  Laterality: N/A;   WISDOM TOOTH EXTRACTION     age 4    Family History: Family History  Problem Relation Age of Onset   Cancer Mother 13       breast   Alcohol abuse Mother    Depression Mother    Hypertension Mother    Depression Sister     Hypertension Sister    Asthma Brother    Depression Brother    Hypertension Brother    Alcohol abuse Maternal Aunt    Hypertension Maternal Aunt    Cancer Maternal Aunt 54       breast ca   Alcohol abuse Maternal Uncle    Hypertension Maternal Uncle    Heart disease Maternal Grandmother    Alcohol abuse Maternal Grandmother    Hypertension Maternal Grandmother    Heart disease Maternal Grandfather    Alcohol abuse Maternal Grandfather    Hypertension Maternal Grandfather    Cancer Maternal Grandfather        prostate and bone   Depression Paternal Grandfather    Alzheimer's disease Paternal Grandmother    Diabetes Paternal Grandmother     Social History: Social History   Tobacco Use   Smoking status: Every Day    Types: Cigars   Smokeless tobacco: Never  Vaping Use   Vaping Use: Never used  Substance Use Topics   Alcohol use: No   Drug use: Not Currently    Types: Marijuana    Comment: last use jan 2024    Allergies:  Allergies  Allergen Reactions   Penicillins Itching and Swelling    Patient tolerates: amoxicillin, cefazolin, and cephalexin  Has patient had a PCN reaction causing immediate rash, facial/tongue/throat swelling, SOB or lightheadedness with hypotension: No Has patient had a PCN reaction causing severe rash involving mucus membranes or skin necrosis: No Has patient had a PCN reaction that required hospitalization No Has patient had a PCN reaction occurring within the last 10 years: No If all of the above answers are "NO", then may proceed with Cephalosporin use.    Pork-Derived Products Other (See Comments)    Religious reasons     Meds:  Medications Prior to Admission  Medication Sig Dispense Refill Last Dose   acetaminophen (TYLENOL) 500 MG tablet Take 2 tablets (1,000 mg total) by mouth every 6 (six) hours as needed for mild pain or moderate pain. (Patient not taking: Reported on 02/24/2023) 30 tablet 0    albuterol (VENTOLIN HFA) 108 (90 Base)  MCG/ACT inhaler Inhale 2 puffs into the lungs every 6 (six) hours as needed for wheezing or shortness of breath. 8.5 g 3    enoxaparin (LOVENOX) 60 MG/0.6ML injection Inject 0.6 mLs (60 mg total) into the skin daily. 18 mL 6    polyethylene glycol (MIRALAX / GLYCOLAX) 17 g packet Take 17 g by mouth daily. (Patient not  taking: Reported on 12/31/2022) 28 each 0    prenatal vitamin w/FE, FA (PRENATAL 1 + 1) 27-1 MG TABS tablet Take 1 tablet by mouth daily at 12 noon. 30 tablet 11    promethazine (PHENERGAN) 25 MG suppository Place 1 suppository (25 mg total) rectally every 6 (six) hours as needed for nausea or vomiting. (Patient not taking: Reported on 12/31/2022) 12 each 0    promethazine (PHENERGAN) 25 MG tablet Take 1 tablet (25 mg total) by mouth every 6 (six) hours as needed for nausea. 30 tablet 0    sertraline (ZOLOFT) 50 MG tablet Take 1 tablet (50 mg total) by mouth daily. (Patient not taking: Reported on 12/31/2022) 30 tablet 3     I have reviewed patient's Past Medical Hx, Surgical Hx, Family Hx, Social Hx, medications and allergies.   ROS:  Review of Systems  Constitutional:  Negative for fever.  Eyes:  Positive for photophobia.  Gastrointestinal:  Positive for abdominal pain, diarrhea and nausea. Negative for anorexia, constipation and vomiting.  Genitourinary:  Negative for dysuria and frequency.  Musculoskeletal:  Negative for myalgias.  Neurological:  Positive for headaches. Negative for dizziness.   Other systems negative  Physical Exam  Patient Vitals for the past 24 hrs:  BP Temp Pulse Resp SpO2 Height Weight  03/04/23 0224 116/77 98.2 F (36.8 C) 98 20 99 % '5\' 6"'$  (1.676 m) (!) 138.4 kg   Constitutional: Well-developed, well-nourished female in no acute distress.  Cardiovascular: normal rate and rhythm Respiratory: normal effort, clear to auscultation bilaterally GI: Abd soft, gravid appropriate for gestational age.   No rebound or guarding.    Tender over periumbilical  and RLQ of abdomen.   MS: Extremities nontender, no edema, normal ROM Neurologic: Alert and oriented x 4.  GU: Neg CVAT.  PELVIC EXAM:  Cervix is closed and long/firm/high  FHT:  145   Labs: Results for orders placed or performed during the hospital encounter of 03/04/23 (from the past 24 hour(s))  Urinalysis, Routine w reflex microscopic -Urine, Clean Catch     Status: Abnormal   Collection Time: 03/04/23  2:40 AM  Result Value Ref Range   Color, Urine YELLOW YELLOW   APPearance HAZY (A) CLEAR   Specific Gravity, Urine 1.020 1.005 - 1.030   pH 6.0 5.0 - 8.0   Glucose, UA NEGATIVE NEGATIVE mg/dL   Hgb urine dipstick SMALL (A) NEGATIVE   Bilirubin Urine NEGATIVE NEGATIVE   Ketones, ur NEGATIVE NEGATIVE mg/dL   Protein, ur NEGATIVE NEGATIVE mg/dL   Nitrite NEGATIVE NEGATIVE   Leukocytes,Ua NEGATIVE NEGATIVE   RBC / HPF 0-5 0 - 5 RBC/hpf   WBC, UA 6-10 0 - 5 WBC/hpf   Bacteria, UA RARE (A) NONE SEEN   Squamous Epithelial / HPF 6-10 0 - 5 /HPF   Mucus PRESENT   CBC     Status: Abnormal   Collection Time: 03/04/23  3:20 AM  Result Value Ref Range   WBC 14.2 (H) 4.0 - 10.5 K/uL   RBC 4.05 3.87 - 5.11 MIL/uL   Hemoglobin 11.7 (L) 12.0 - 15.0 g/dL   HCT 35.4 (L) 36.0 - 46.0 %   MCV 87.4 80.0 - 100.0 fL   MCH 28.9 26.0 - 34.0 pg   MCHC 33.1 30.0 - 36.0 g/dL   RDW 18.7 (H) 11.5 - 15.5 %   Platelets 260 150 - 400 K/uL   nRBC 0.0 0.0 - 0.2 %  Comprehensive metabolic panel     Status:  Abnormal   Collection Time: 03/04/23  3:20 AM  Result Value Ref Range   Sodium 134 (L) 135 - 145 mmol/L   Potassium 3.7 3.5 - 5.1 mmol/L   Chloride 104 98 - 111 mmol/L   CO2 20 (L) 22 - 32 mmol/L   Glucose, Bld 105 (H) 70 - 99 mg/dL   BUN 6 6 - 20 mg/dL   Creatinine, Ser 0.57 0.44 - 1.00 mg/dL   Calcium 9.4 8.9 - 10.3 mg/dL   Total Protein 6.3 (L) 6.5 - 8.1 g/dL   Albumin 2.6 (L) 3.5 - 5.0 g/dL   AST 23 15 - 41 U/L   ALT 26 0 - 44 U/L   Alkaline Phosphatase 58 38 - 126 U/L   Total Bilirubin  0.4 0.3 - 1.2 mg/dL   GFR, Estimated >60 >60 mL/min   Anion gap 10 5 - 15    B/Positive/-- (01/11 1608)  Imaging:  MR ABDOMEN WO CONTRAST  Result Date: 03/04/2023 CLINICAL DATA:  Evaluate for appendicitis. Abdominal pain and pregnancy. EXAM: MRI ABDOMEN AND PELVIS WITHOUT CONTRAST TECHNIQUE: Multiplanar multisequence MR imaging of the abdomen and pelvis was performed. No intravenous contrast was administered. COMPARISON:  None Available. FINDINGS: COMBINED FINDINGS FOR BOTH MR ABDOMEN AND PELVIS Diminished exam detail secondary to body habitus and motion artifact. Lower chest: No acute findings. Hepatobiliary: No focal liver abnormality identified. Gallbladder appears distended. There is no gallbladder wall thickening, gallstones or gallbladder wall inflammation.Common bile duct is upper limits of normal measuring 6 mm. No signs of choledocholithiasis Pancreas: No mass, inflammatory changes, or other parenchymal abnormality identified. Spleen:  Within normal limits in size and appearance. Adrenals/Urinary Tract: Normal adrenal glands. No nephrolithiasis or scratch set no kidney mass or hydronephrosis. Bladder is unremarkable. Stomach/Bowel: Stomach is nondistended. The appendix is visualized and appears normal., image 10/12 and image 28/3. No bowel wall thickening, inflammation or distension. Vascular/Lymphatic: No pathologically enlarged lymph nodes identified. No abdominal aortic aneurysm demonstrated. Reproductive: Gravid uterus is identified. The right ovary appears asymmetrically enlarged, image 14/11. The right ovarian vein appears enlarged with asymmetric increased T2 signal. Cyst within the right ovary appears uniformly T2 hyperintense and T1 hypointense measuring 3.7 x 3.7 cm, image 10/6. Other: No significant free fluid. No focal fluid collections identified. Musculoskeletal: No suspicious bone lesions identified. IMPRESSION: 1. The appendix is visualized and appears normal. 2. Gravid uterus. 3.  The right ovary appears asymmetrically enlarged with asymmetric increased T2 signal. The right ovarian vein appears enlarged with asymmetric increased T2 signal. Lack of IV contrast material limits assessment for luminal thrombus. Correlate for any clinical signs or symptoms of ovarian vein thrombosis. Consider further evaluation with Doppler ultrasound of the ovaries. 4. Cyst within the right ovary appears uniformly T2 hyperintense and T1 hypointense measuring 3.7 x 3.7 cm. This is favored to represent a benign physiologic cyst. 5. Common bile duct is upper limits of normal measuring 6 mm. No signs of choledocholithiasis. Electronically Signed   By: Kerby Moors M.D.   On: 03/04/2023 07:00   MR PELVIS WO CONTRAST  Result Date: 03/04/2023 CLINICAL DATA:  Evaluate for appendicitis. Abdominal pain and pregnancy. EXAM: MRI ABDOMEN AND PELVIS WITHOUT CONTRAST TECHNIQUE: Multiplanar multisequence MR imaging of the abdomen and pelvis was performed. No intravenous contrast was administered. COMPARISON:  None Available. FINDINGS: COMBINED FINDINGS FOR BOTH MR ABDOMEN AND PELVIS Diminished exam detail secondary to body habitus and motion artifact. Lower chest: No acute findings. Hepatobiliary: No focal liver abnormality identified. Gallbladder appears distended.  There is no gallbladder wall thickening, gallstones or gallbladder wall inflammation.Common bile duct is upper limits of normal measuring 6 mm. No signs of choledocholithiasis Pancreas: No mass, inflammatory changes, or other parenchymal abnormality identified. Spleen:  Within normal limits in size and appearance. Adrenals/Urinary Tract: Normal adrenal glands. No nephrolithiasis or scratch set no kidney mass or hydronephrosis. Bladder is unremarkable. Stomach/Bowel: Stomach is nondistended. The appendix is visualized and appears normal., image 10/12 and image 28/3. No bowel wall thickening, inflammation or distension. Vascular/Lymphatic: No pathologically  enlarged lymph nodes identified. No abdominal aortic aneurysm demonstrated. Reproductive: Gravid uterus is identified. The right ovary appears asymmetrically enlarged, image 14/11. The right ovarian vein appears enlarged with asymmetric increased T2 signal. Cyst within the right ovary appears uniformly T2 hyperintense and T1 hypointense measuring 3.7 x 3.7 cm, image 10/6. Other: No significant free fluid. No focal fluid collections identified. Musculoskeletal: No suspicious bone lesions identified. IMPRESSION: 1. The appendix is visualized and appears normal. 2. Gravid uterus. 3. The right ovary appears asymmetrically enlarged with asymmetric increased T2 signal. The right ovarian vein appears enlarged with asymmetric increased T2 signal. Lack of IV contrast material limits assessment for luminal thrombus. Correlate for any clinical signs or symptoms of ovarian vein thrombosis. Consider further evaluation with Doppler ultrasound of the ovaries. 4. Cyst within the right ovary appears uniformly T2 hyperintense and T1 hypointense measuring 3.7 x 3.7 cm. This is favored to represent a benign physiologic cyst. 5. Common bile duct is upper limits of normal measuring 6 mm. No signs of choledocholithiasis. Electronically Signed   By: Kerby Moors M.D.   On: 03/04/2023 07:00     MAU Course/MDM: I have reviewed the triage vital signs and the nursing notes.   Pertinent labs & imaging results that were available during my care of the patient were reviewed by me and considered in my medical decision making (see chart for details).      I have reviewed her medical records including past results, notes and treatments.   I have ordered labs and reviewed results.  NST reviewed Consult Dr Elly Modena with presentation, exam findings and test results. She recommends MRI to rule out appendicitis Treatments in MAU included Levsin, IV fluids.  Reglan and Benadryl given for headache without relief Abdominal pain Improved to a  "3" Then we gave Excedrin Tension for persistent headache at a "7" which relieved it somewhat to a "5" Then I ordered Flexeril at 0800 to see if we can achieve better relief  Report/care given to oncoming provider  Assumed care of patient at 0800  On reassessment, pain much relieved and pt feels the headache is abating.  Since she is a Red Chart patient and has a history of PE, reviewed chart with Dr. Mikel Cella who also agreed given pt's abdominal pain is gone, there is little concern for ovarian torsion or thrombus. Pt stable for discharge home with return pain precautions.  Assessment: Single IUP at 67w5dAbdominal pain RIght ovarian cyst with ovarian enlargement Headache in pregnancy  Plan: Discharge home in stable condition with return precautions [facility]  Allergies as of 03/04/2023       Reactions   Penicillins Itching, Swelling   Patient tolerates: amoxicillin, cefazolin, and cephalexin Has patient had a PCN reaction causing immediate rash, facial/tongue/throat swelling, SOB or lightheadedness with hypotension: No Has patient had a PCN reaction causing severe rash involving mucus membranes or skin necrosis: No Has patient had a PCN  reaction that required hospitalization No Has patient had a PCN reaction occurring within the last 10 years: No If all of the above answers are "NO", then may proceed with Cephalosporin use.   Pork-derived Products Other (See Comments)   Religious reasons         Medication List     TAKE these medications    acetaminophen 500 MG tablet Commonly known as: TYLENOL Take 2 tablets (1,000 mg total) by mouth every 6 (six) hours as needed for mild pain or moderate pain.   albuterol 108 (90 Base) MCG/ACT inhaler Commonly known as: VENTOLIN HFA Inhale 2 puffs into the lungs every 6 (six) hours as needed for wheezing or shortness of breath.   enoxaparin 60 MG/0.6ML injection Commonly known as:  LOVENOX Inject 0.6 mLs (60 mg total) into the skin daily.   polyethylene glycol 17 g packet Commonly known as: MIRALAX / GLYCOLAX Take 17 g by mouth daily.   prenatal vitamin w/FE, FA 27-1 MG Tabs tablet Take 1 tablet by mouth daily at 12 noon.   promethazine 25 MG suppository Commonly known as: PHENERGAN Place 1 suppository (25 mg total) rectally every 6 (six) hours as needed for nausea or vomiting.   promethazine 25 MG tablet Commonly known as: PHENERGAN Take 1 tablet (25 mg total) by mouth every 6 (six) hours as needed for nausea.   sertraline 50 MG tablet Commonly known as: ZOLOFT Take 1 tablet (50 mg total) by mouth daily.       Gaylan Gerold, CNM, MSN, [place] Certified Nurse Midwife, [facility]

## 2023-03-04 NOTE — MAU Note (Signed)
.  Anne Richardson is a 37 y.o. at [redacted]w[redacted]d here in MAU reporting: yesterday evening around 1800 started getting cramps in [place] - cramps have spread to right side that are here and there but constant in [place]. Unable to sleep due to it. Reports HA and stating "the same cramps I'm feeling in my stomach I feel in my brain". Denies VB, LOF, or abnormal discharge. Endorses FM. Took tylenol around 1800 - "it didn't do anything"   Pain score: 10 - abdomen; 6 - HA Vitals:   03/04/23 0224  BP: 116/77  Pulse: 98  Resp: 20  Temp: 98.2 F (36.8 C)  SpO2: 99%     FHT:145 Lab orders placed from triage:  UA

## 2023-03-09 ENCOUNTER — Other Ambulatory Visit: Payer: Self-pay | Admitting: Obstetrics and Gynecology

## 2023-03-09 DIAGNOSIS — O0991 Supervision of high risk pregnancy, unspecified, first trimester: Secondary | ICD-10-CM

## 2023-03-09 MED ORDER — PROMETHAZINE HCL 25 MG PO TABS: 25.0000 mg | ORAL_TABLET | Freq: Four times a day (QID) | ORAL | 0 refills | Status: DC | PRN

## 2023-03-15 ENCOUNTER — Other Ambulatory Visit: Payer: Self-pay | Admitting: Family Medicine

## 2023-03-15 DIAGNOSIS — O0991 Supervision of high risk pregnancy, unspecified, first trimester: Secondary | ICD-10-CM

## 2023-03-24 ENCOUNTER — Encounter: Payer: Medicaid Other | Admitting: Obstetrics & Gynecology

## 2023-04-21 ENCOUNTER — Other Ambulatory Visit: Payer: Self-pay

## 2023-04-21 ENCOUNTER — Ambulatory Visit (INDEPENDENT_AMBULATORY_CARE_PROVIDER_SITE_OTHER): Payer: Medicaid Other | Admitting: Obstetrics and Gynecology

## 2023-04-21 ENCOUNTER — Other Ambulatory Visit: Payer: Medicaid Other

## 2023-04-21 VITALS — BP 129/76 | HR 95 | Wt 300.2 lb

## 2023-04-21 DIAGNOSIS — O43213 Placenta accreta, third trimester: Secondary | ICD-10-CM

## 2023-04-21 DIAGNOSIS — O09523 Supervision of elderly multigravida, third trimester: Secondary | ICD-10-CM

## 2023-04-21 DIAGNOSIS — Z86711 Personal history of pulmonary embolism: Secondary | ICD-10-CM

## 2023-04-21 DIAGNOSIS — H6691 Otitis media, unspecified, right ear: Secondary | ICD-10-CM

## 2023-04-21 DIAGNOSIS — Z8632 Personal history of gestational diabetes: Secondary | ICD-10-CM

## 2023-04-21 DIAGNOSIS — J302 Other seasonal allergic rhinitis: Secondary | ICD-10-CM

## 2023-04-21 DIAGNOSIS — Z3A28 28 weeks gestation of pregnancy: Secondary | ICD-10-CM

## 2023-04-21 DIAGNOSIS — Z98891 History of uterine scar from previous surgery: Secondary | ICD-10-CM

## 2023-04-21 DIAGNOSIS — O0993 Supervision of high risk pregnancy, unspecified, third trimester: Secondary | ICD-10-CM

## 2023-04-21 DIAGNOSIS — Z6841 Body Mass Index (BMI) 40.0 and over, adult: Secondary | ICD-10-CM

## 2023-04-21 DIAGNOSIS — O43212 Placenta accreta, second trimester: Secondary | ICD-10-CM

## 2023-04-21 DIAGNOSIS — Z8759 Personal history of other complications of pregnancy, childbirth and the puerperium: Secondary | ICD-10-CM

## 2023-04-21 DIAGNOSIS — O0991 Supervision of high risk pregnancy, unspecified, first trimester: Secondary | ICD-10-CM

## 2023-04-21 DIAGNOSIS — O283 Abnormal ultrasonic finding on antenatal screening of mother: Secondary | ICD-10-CM

## 2023-04-21 DIAGNOSIS — Z7189 Other specified counseling: Secondary | ICD-10-CM

## 2023-04-21 DIAGNOSIS — O0990 Supervision of high risk pregnancy, unspecified, unspecified trimester: Secondary | ICD-10-CM

## 2023-04-21 MED ORDER — PRENATAL PLUS 27-1 MG PO TABS: 1.0000 | ORAL_TABLET | Freq: Every day | ORAL | 11 refills | Status: AC

## 2023-04-21 MED ORDER — PROMETHAZINE HCL 25 MG PO TABS: 25.0000 mg | ORAL_TABLET | Freq: Four times a day (QID) | ORAL | 0 refills | Status: AC | PRN

## 2023-04-21 MED ORDER — FLUTICASONE PROPIONATE 50 MCG/ACT NA SUSP: 1.0000 | Freq: Every day | NASAL | 2 refills | Status: AC

## 2023-04-21 MED ORDER — CETIRIZINE HCL 10 MG PO TABS: 10.0000 mg | ORAL_TABLET | Freq: Every day | ORAL | 11 refills | Status: AC

## 2023-04-21 MED ORDER — ENOXAPARIN SODIUM 60 MG/0.6ML IJ SOSY: 60.0000 mg | PREFILLED_SYRINGE | INTRAMUSCULAR | 6 refills | Status: DC

## 2023-04-21 MED ORDER — AMOXICILLIN-POT CLAVULANATE 875-125 MG PO TABS: 1.0000 | ORAL_TABLET | Freq: Two times a day (BID) | ORAL | 0 refills | Status: AC

## 2023-04-21 NOTE — Progress Notes (Signed)
   PRENATAL VISIT NOTE  Subjective:  Anne Richardson is a 37 y.o. [id number] at [redacted]w[redacted]d being seen today for ongoing prenatal care.  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  Patient doing well with no acute concerns today. She reports  upper respiratory symptoms .   . Vag. Bleeding: None.  Movement: Present. Denies leaking of fluid.   The following portions of the patient's history were reviewed and updated as appropriate: allergies, current medications, past family history, past medical history, past social history, past surgical history and problem list. Problem list updated.  Objective:   Vitals:   04/21/23 0909  BP: 129/76  Pulse: 95  Weight: (!) 300 lb 3 oz (136.2 kg)    Fetal Status: Fetal Heart Rate (bpm): 138 Fundal Height: 28 cm Movement: Present     General:  Alert, oriented and cooperative. Patient is in no acute distress.  Skin: Skin is warm and dry. No rash noted.   Cardiovascular: Normal heart rate noted  Respiratory: Normal respiratory effort, no problems with respiration noted  Abdomen: Soft, gravid, appropriate for gestational age.  Pain/Pressure: Absent     Pelvic: Cervical exam deferred        Extremities: Normal range of motion.  Edema: None  Mental Status:  Normal mood and affect. Normal behavior. Normal judgment and thought content.   Assessment and Plan:  Pregnancy: [id number] at  [redacted]w[redacted]d  1. [redacted] weeks gestation of pregnancy   2. 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 Pt appears to have missed her 4/24 scan with [facility]. Will reschedule ASAP, will inform our MFMs as well.  3. Previous cesarean section Previous c/s x 4, pt will need repeat, site and time undetermined at this point.  BTL form signed today  4. RED Chart Rounds pt Will inform MFM pt missed her [facility] MFM appt and get her rescheduled  5. History of pulmonary embolism Continue lovenox  6. History of gestational hypertension BP WNL  7. History of gestational diabetes mellitus (GDM) 2 hour GTT today  8. Multigravida of advanced maternal age in third trimester Pt is being assessed separately by Dr. Alvester Morin regarding possible sinus infx vs allergies  9. Abnormal fetal ultrasound   10. BMI 40.0-44.9, adult (HCC)   Preterm labor symptoms and general obstetric precautions including but not limited to vaginal bleeding, contractions, leaking of fluid and fetal movement were reviewed in detail with the patient.  Please refer to After Visit Summary for other counseling recommendations.   Return in about 2 weeks (around 05/05/2023) for [facility], in person.   Mariel Aloe, MD Faculty Attending Center for [facility]

## 2023-04-21 NOTE — Progress Notes (Signed)
Hearing concerns, ears feel clogged and possible sinus pressure. Was instructed to take mucinex but with relief and reports sore throat and mucous when clearing throat

## 2023-04-21 NOTE — Progress Notes (Signed)
S: Asked to see patient due to ear pain and pressure, having associated sore throat x1 week. Stabbing pain in the right ear that is intermittent. She reports mucous in her mouth after sleeping. She has noticed decreased hearing bilaterally.   Physical Exam HENT:     Right Ear: Decreased hearing noted. Tenderness present. A middle ear effusion is present. Tympanic membrane is injected and erythematous. Tympanic membrane is not retracted or bulging.     Left Ear: Decreased hearing noted. No tenderness.  No middle ear effusion. Tympanic membrane is erythematous (mild). Tympanic membrane is not injected, scarred, retracted or bulging.    # Right acute otitis media Exam indicated AOM Discussed medications for treatment Patient reports specifically that she had had amoxicillin before and tolerated the medication. She said amoxicillin by name, unprompted - amoxicillin-clavulanate (AUGMENTIN) 875-125 MG tablet; Take 1 tablet by mouth 2 (two) times daily for 7 days.  Dispense: 14 tablet; Refill: 0  13. Seasonal allergies Likely the underlying cause of congestion and post nasal drip Recommended treatment to help improve sx Discussed also nasal saline washes  - fluticasone (FLONASE) 50 MCG/ACT nasal spray; Place 1 spray into both nostrils daily.  Dispense: 16 g; Refill: 2 - cetirizine (ZYRTEC ALLERGY) 10 MG tablet; Take 1 tablet (10 mg total) by mouth daily.  Dispense: 30 tablet; Refill: 11  Federico Flake, MD

## 2023-04-22 LAB — CBC
Hematocrit: 35.9 % (ref 34.0–46.6)
Hemoglobin: 11.9 g/dL (ref 11.1–15.9)
MCH: 29.2 pg (ref 26.6–33.0)
MCHC: 33.1 g/dL (ref 31.5–35.7)
MCV: 88 fL (ref 79–97)
Platelets: 279 10*3/uL (ref 150–450)
RBC: 4.08 x10E6/uL (ref 3.77–5.28)
RDW: 15.5 % — ABNORMAL HIGH (ref 11.7–15.4)
WBC: 14.4 10*3/uL — ABNORMAL HIGH (ref 3.4–10.8)

## 2023-04-22 LAB — GLUCOSE TOLERANCE, 2 HOURS W/ 1HR
Glucose, 1 hour: 161 mg/dL (ref 70–179)
Glucose, 2 hour: 86 mg/dL (ref 70–152)
Glucose, Fasting: 76 mg/dL (ref 70–91)

## 2023-04-22 LAB — RPR: RPR Ser Ql: NONREACTIVE

## 2023-04-22 LAB — HIV ANTIBODY (ROUTINE TESTING W REFLEX): HIV Screen 4th Generation wRfx: NONREACTIVE

## 2023-05-19 ENCOUNTER — Other Ambulatory Visit: Payer: Self-pay | Admitting: Obstetrics

## 2023-05-19 ENCOUNTER — Other Ambulatory Visit: Payer: Self-pay

## 2023-05-19 ENCOUNTER — Ambulatory Visit: Payer: Medicaid Other | Attending: Obstetrics

## 2023-05-19 ENCOUNTER — Other Ambulatory Visit: Payer: Self-pay | Admitting: *Deleted

## 2023-05-19 ENCOUNTER — Ambulatory Visit: Payer: Medicaid Other | Attending: Obstetrics | Admitting: Obstetrics

## 2023-05-19 ENCOUNTER — Ambulatory Visit (INDEPENDENT_AMBULATORY_CARE_PROVIDER_SITE_OTHER): Payer: Medicaid Other | Admitting: Family Medicine

## 2023-05-19 VITALS — BP 138/81 | HR 109 | Wt 315.0 lb

## 2023-05-19 DIAGNOSIS — Z86711 Personal history of pulmonary embolism: Secondary | ICD-10-CM

## 2023-05-19 DIAGNOSIS — E669 Obesity, unspecified: Secondary | ICD-10-CM

## 2023-05-19 DIAGNOSIS — Z7189 Other specified counseling: Secondary | ICD-10-CM

## 2023-05-19 DIAGNOSIS — O0993 Supervision of high risk pregnancy, unspecified, third trimester: Secondary | ICD-10-CM

## 2023-05-19 DIAGNOSIS — O43213 Placenta accreta, third trimester: Secondary | ICD-10-CM

## 2023-05-19 DIAGNOSIS — Z98891 History of uterine scar from previous surgery: Secondary | ICD-10-CM

## 2023-05-19 DIAGNOSIS — O99213 Obesity complicating pregnancy, third trimester: Secondary | ICD-10-CM | POA: Diagnosis not present

## 2023-05-19 DIAGNOSIS — O34219 Maternal care for unspecified type scar from previous cesarean delivery: Secondary | ICD-10-CM

## 2023-05-19 DIAGNOSIS — O43212 Placenta accreta, second trimester: Secondary | ICD-10-CM

## 2023-05-19 DIAGNOSIS — O0991 Supervision of high risk pregnancy, unspecified, first trimester: Secondary | ICD-10-CM

## 2023-05-19 DIAGNOSIS — Z3A32 32 weeks gestation of pregnancy: Secondary | ICD-10-CM

## 2023-05-19 DIAGNOSIS — O24410 Gestational diabetes mellitus in pregnancy, diet controlled: Secondary | ICD-10-CM

## 2023-05-19 DIAGNOSIS — O09523 Supervision of elderly multigravida, third trimester: Secondary | ICD-10-CM

## 2023-05-19 DIAGNOSIS — O99210 Obesity complicating pregnancy, unspecified trimester: Secondary | ICD-10-CM

## 2023-05-19 NOTE — Progress Notes (Signed)
PRENATAL VISIT NOTE  Subjective:  Anne Richardson is a 37 y.o. [phone] at [redacted]w[redacted]d being seen today for ongoing prenatal care.  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  Patient reports no complaints.  Contractions: Irritability. Vag. Bleeding: None.  Movement: Present. Denies leaking of fluid.   The following portions of the patient's history were reviewed and updated as appropriate: allergies, current medications, past family history, past medical history, past social history, past surgical history and problem list.   Objective:   Vitals:   05/19/23 1125 05/19/23 1130  BP: (!) 158/80 138/81  Pulse: (!) 102 (!) 109  Weight: (!) 315 lb (142.9 kg)     Fetal Status: Fetal Heart Rate (bpm): 146   Movement: Present     General:  Alert, oriented and cooperative. Patient is in no acute distress.  Skin: Skin is warm and dry. No rash noted.   Cardiovascular: Normal heart rate noted  Respiratory: Normal respiratory effort, no problems with respiration noted  Abdomen: Soft, gravid, appropriate for gestational age.  Pain/Pressure: Present     Pelvic: Cervical exam deferred        Extremities: Normal range of motion.     Mental Status: Normal mood and affect. Normal behavior. Normal judgment and thought content.   Assessment and Plan:  Pregnancy: [id number] at [redacted]w[redacted]d 1. Supervision of high risk pregnancy in first  trimester Up to date  FH appropriate-- but challenging due to obesity and not reliable Feeling good movement Initially elevated BP but was WNL on repeat-- continue to monitor  2. Multigravida of advanced maternal age in third trimester LR NIP  3. RED Chart Rounds pt Patient with initial concern for accreta  Walked up to MFM today -- Dr Parke Poisson helped to coordinate seeing the patient today to help assess placental location. No scans since 03/10/23 She was walked up to MFM for her scan today Per Dr Parke Poisson-- patient plans and WANTS to delivery at [facility]. She has not been there since 3/20. Dr. Parke Poisson has helped coordinate transfer of care.  Confirm at next visit safe transfer to [facility]  4. Obesity in pregnancy TWG=88 lb (39.9 kg) which is very much above goal Scheduled for routine antenatal surveillance, weekly BPPs  5. History of pulmonary embolism On Lovenox   6. 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 Patient seen on 3/20 at [facility], they desired q 4 week follow ups at that time but has not been seen since that time. She reports she NS for her April appointment due to "transportation and all the school stuff"  7. Previous cesarean section CSx4, desires BTL  Preterm labor symptoms and general obstetric precautions including but not limited to vaginal bleeding, contractions, leaking of fluid and fetal movement were reviewed in detail with the patient. Please refer to After Visit Summary for other counseling recommendations.   Return in about 2 weeks (around 06/02/2023) for Routine prenatal care, MD only.  Future Appointments  Date Time Provider Department Center  05/24/2023  9:15 AM [facility] NURSE [facility]  05/24/2023  9:30 AM [facility] US3 [facility]  05/31/2023 10:30 AM [facility] NURSE [facility]  05/31/2023 10:45 AM [facility] US4 [facility]  06/04/2023  8:55 AM [place], MD [facility]    Federico Flake, MD

## 2023-05-20 NOTE — Progress Notes (Signed)
MFM Note  Anne Richardson was seen due to maternal obesity with a BMI of 50 and possible placenta accreta based on ultrasounds performed earlier in her pregnancy.  The patient has 4 prior cesarean deliveries.  She also has a history of a prior pulmonary embolus and is being treated with daily Lovenox.  Her current pregnancy has also been complicated by diet-controlled gestational diabetes.  The patient had a prior consultation with MFM at [facility].  A possible focal accreta was suspected.  Due to transportation issues, she has not been able to follow-up with the physicians at [facility].  The overall EFW obtained today of 3 pounds 13 ounces measures at the 10th percentile for her gestational age.  There was normal amniotic fluid noted today.    Due to extreme maternal body habitus, the fetal head measurements were difficult to obtain.    The views of the fetal anatomy were unable to be fully visualized today due to her advanced gestational age and extreme maternal body habitus.  A normal-appearing anterior placenta is noted.  There were no signs of lacunae in the placenta.  There appears to be a clear delineation of the placenta and the attached uterine wall.  There were no signs of placenta previa noted.    The patient was advised that based on today's ultrasound findings, my suspicion for placenta accreta is low.    She was advised that not all cases of placenta accreta can be detected via ultrasound.  Her main risk factors for placenta accreta spectrum is her history of 4 prior cesarean deliveries and the fact that the placenta is located in the area of her prior cesarean section scar.   The patient reports that as she has already established care with the physicians at [facility], she would prefer to deliver at [facility].  She was advised to contact the MFM office at [facility] to set up another appointment with them soon.   Due to maternal obesity, she should continue weekly fetal  testing until delivery.  A BPP has been scheduled for her in our office next week.  However if she can be seen at [facility] next week, she will cancel the appointment in our office.  The patient stated that all of her questions were answered today.  A total of 45 minutes was spent counseling and coordinating the care for this patient.  Greater than 50% of the time was spent in direct face-to-face contact.

## 2023-05-24 ENCOUNTER — Ambulatory Visit: Payer: Medicaid Other | Admitting: *Deleted

## 2023-05-24 ENCOUNTER — Ambulatory Visit: Payer: Medicaid Other

## 2023-05-24 ENCOUNTER — Ambulatory Visit: Payer: Medicaid Other | Attending: Obstetrics

## 2023-05-24 VITALS — BP 113/67 | HR 101

## 2023-05-24 DIAGNOSIS — E669 Obesity, unspecified: Secondary | ICD-10-CM

## 2023-05-24 DIAGNOSIS — O43213 Placenta accreta, third trimester: Secondary | ICD-10-CM | POA: Diagnosis not present

## 2023-05-24 DIAGNOSIS — Z3A33 33 weeks gestation of pregnancy: Secondary | ICD-10-CM

## 2023-05-24 DIAGNOSIS — O34219 Maternal care for unspecified type scar from previous cesarean delivery: Secondary | ICD-10-CM | POA: Diagnosis present

## 2023-05-24 DIAGNOSIS — O24410 Gestational diabetes mellitus in pregnancy, diet controlled: Secondary | ICD-10-CM

## 2023-05-24 DIAGNOSIS — O99213 Obesity complicating pregnancy, third trimester: Secondary | ICD-10-CM | POA: Diagnosis not present

## 2023-05-30 ENCOUNTER — Inpatient Hospital Stay (HOSPITAL_COMMUNITY)
Admission: AD | Admit: 2023-05-30 | Discharge: 2023-05-30 | Disposition: A | Discharge status: Home / Self Care | Payer: Medicaid Other | Attending: Obstetrics and Gynecology | Admitting: Obstetrics and Gynecology

## 2023-05-30 ENCOUNTER — Encounter (HOSPITAL_COMMUNITY): Payer: Self-pay | Admitting: Obstetrics and Gynecology

## 2023-05-30 DIAGNOSIS — R519 Headache, unspecified: Secondary | ICD-10-CM | POA: Diagnosis present

## 2023-05-30 DIAGNOSIS — O4703 False labor before 37 completed weeks of gestation, third trimester: Secondary | ICD-10-CM | POA: Diagnosis not present

## 2023-05-30 DIAGNOSIS — O26893 Other specified pregnancy related conditions, third trimester: Secondary | ICD-10-CM | POA: Diagnosis not present

## 2023-05-30 DIAGNOSIS — Z3A34 34 weeks gestation of pregnancy: Secondary | ICD-10-CM | POA: Diagnosis not present

## 2023-05-30 DIAGNOSIS — O99333 Smoking (tobacco) complicating pregnancy, third trimester: Secondary | ICD-10-CM | POA: Diagnosis not present

## 2023-05-30 DIAGNOSIS — O34219 Maternal care for unspecified type scar from previous cesarean delivery: Secondary | ICD-10-CM | POA: Insufficient documentation

## 2023-05-30 DIAGNOSIS — Z880 Allergy status to penicillin: Secondary | ICD-10-CM | POA: Insufficient documentation

## 2023-05-30 DIAGNOSIS — Z98891 History of uterine scar from previous surgery: Secondary | ICD-10-CM | POA: Diagnosis not present

## 2023-05-30 DIAGNOSIS — O43213 Placenta accreta, third trimester: Secondary | ICD-10-CM | POA: Diagnosis not present

## 2023-05-30 DIAGNOSIS — F17290 Nicotine dependence, other tobacco product, uncomplicated: Secondary | ICD-10-CM | POA: Diagnosis not present

## 2023-05-30 LAB — URINALYSIS, ROUTINE W REFLEX MICROSCOPIC
Bilirubin Urine: NEGATIVE
Glucose, UA: NEGATIVE mg/dL
Ketones, ur: NEGATIVE mg/dL
Leukocytes,Ua: NEGATIVE
Nitrite: NEGATIVE
Protein, ur: 100 mg/dL — AB
Specific Gravity, Urine: 1.021 (ref 1.005–1.030)
pH: 5 (ref 5.0–8.0)

## 2023-05-30 MED ORDER — POLYETHYLENE GLYCOL 3350 17 G PO PACK: 17.0000 g | PACK | Freq: Every day | ORAL | 0 refills | Status: AC

## 2023-05-30 MED ORDER — METOCLOPRAMIDE HCL 10 MG PO TABS: 10.0000 mg | ORAL_TABLET | Freq: Four times a day (QID) | ORAL | 0 refills | Status: DC | PRN

## 2023-05-30 MED ORDER — ACETAMINOPHEN 500 MG PO TABS
1000.0000 mg | ORAL_TABLET | Freq: Once | ORAL | Status: AC
  Administered 2023-05-30: 1000 mg via ORAL
  Filled 2023-05-30: qty 2

## 2023-05-30 NOTE — Discharge Instructions (Signed)

## 2023-05-30 NOTE — MAU Provider Note (Signed)
Chief Complaint:  Contractions and Headache   None     HPI: Anne Richardson is a 37 y.o. [id number] at [redacted]w[redacted]d by LMP with pregnancy complicated by hx PE on Lovenox, and possible accreta with C/S x 4 who presents to maternity admissions reporting cramping/contractions and headache x 2 days.  She also reports constipation with BM yesterday, but with hard stool.  She has not taken anything for her h/a. She started care with [facility] but had transportation issues so reestablished care with [facility] on 5/29. [place] on 05/19/23 with low suspicion of accreta.  [place] on 05/24/23 with poor visualization of placenta and unable to rule out small focal accreta.  Plan for pt to deliver at [facility]. Pt has appt with [facility] in [place] office on 05/31/23.   She reports good fetal movement, denies LOF, vaginal bleeding, vaginal itching/burning, urinary symptoms, dizziness, or fever/chills.    HPI  Past Medical History: Past Medical History:  Diagnosis Date   Anxiety    Chlamydia    Depression    doing good now   Gestational diabetes    Gonorrhea    Insufficient prenatal care in third trimester 07/20/2017   Onset of care at 38w   Pregnancy induced hypertension    Pulmonary embolism (HCC)     Past obstetric history: OB History  Gravida Para Term Preterm AB Living  8 4 3 1 3 5   SAB IAB Ectopic Multiple Live Births  3 0   1 5    # Outcome Date GA Lbr Len/2nd Weight Sex Delivery Anes PTL Lv  8 Current           7 Term 07/24/17 [redacted]w[redacted]d  3255 g F CS-LTranv Spinal  LIV     Birth Comments: wnl  6A Preterm 03/21/16 [redacted]w[redacted]d  2700 g M CS-LTranv Spinal  LIV  6B Preterm 03/21/16 [redacted]w[redacted]d  2230 g M CS-LTranv Spinal  LIV  5 Term 06/28/12 [redacted]w[redacted]d  4483 g F CS-LTranv Spinal  LIV  4 Term 12/13/10   3544 g F CS-LTranv   LIV     Birth Comments: high blood pressure, fever, meconium  3 SAB 12/05/09          2 SAB           1 SAB             Past Surgical History: Past Surgical History:  Procedure Laterality Date    BIOPSY BREAST     BREAST LUMPECTOMY     left   CESAREAN SECTION     CESAREAN SECTION  06/28/2012   Procedure: CESAREAN SECTION;  Surgeon: Lesly Dukes, MD;  Location: [facility];  Service: Gynecology;  Laterality: N/A;   CESAREAN SECTION N/A 03/21/2016   Procedure: CESAREAN SECTION;  Surgeon: Lazaro Arms, MD;  Location: [facility];  Service: Obstetrics;  Laterality: N/A;   CESAREAN SECTION N/A 07/24/2017   Procedure: CESAREAN SECTION;  Surgeon: [name], MD;  Location: [facility] BIRTHING SUITES;  Service: Obstetrics;  Laterality: N/A;   WISDOM TOOTH EXTRACTION     age 70    Family History: Family History  Problem Relation Age of Onset   Cancer Mother 64       breast   Alcohol abuse Mother    Depression Mother    Hypertension Mother    Depression Sister    Hypertension Sister    Asthma Brother    Depression Brother    Hypertension Brother  Alcohol abuse Maternal Aunt    Hypertension Maternal Aunt    Cancer Maternal Aunt 60       breast ca   Alcohol abuse Maternal Uncle    Hypertension Maternal Uncle    Heart disease Maternal Grandmother    Alcohol abuse Maternal Grandmother    Hypertension Maternal Grandmother    Heart disease Maternal Grandfather    Alcohol abuse Maternal Grandfather    Hypertension Maternal Grandfather    Cancer Maternal Grandfather        prostate and bone   Depression Paternal Grandfather    Alzheimer's disease Paternal Grandmother    Diabetes Paternal Grandmother     Social History: Social History   Tobacco Use   Smoking status: Every Day    Types: Cigars   Smokeless tobacco: Never  Vaping Use   Vaping Use: Never used  Substance Use Topics   Alcohol use: No   Drug use: Not Currently    Types: Marijuana    Comment: last use jan 2024    Allergies:  Allergies  Allergen Reactions   Penicillins Itching and Swelling    Patient tolerates: amoxicillin, cefazolin, and cephalexin  Has patient had a PCN reaction causing immediate rash,  facial/tongue/throat swelling, SOB or lightheadedness with hypotension: No Has patient had a PCN reaction causing severe rash involving mucus membranes or skin necrosis: No Has patient had a PCN reaction that required hospitalization No Has patient had a PCN reaction occurring within the last 10 years: No If all of the above answers are "NO", then may proceed with Cephalosporin use.    Pork-Derived Products Other (See Comments)    Religious reasons     Meds:  Medications Prior to Admission  Medication Sig Dispense Refill Last Dose   enoxaparin (LOVENOX) 60 MG/0.6ML injection Inject 0.6 mLs (60 mg total) into the skin daily. 18 mL 6 05/29/2023   acetaminophen (TYLENOL) 500 MG tablet Take 2 tablets (1,000 mg total) by mouth every 6 (six) hours as needed for mild pain or moderate pain. (Patient not taking: Reported on 05/24/2023) 30 tablet 0    albuterol (VENTOLIN HFA) 108 (90 Base) MCG/ACT inhaler Inhale 2 puffs into the lungs every 6 (six) hours as needed for wheezing or shortness of breath. 8.5 g 3    cetirizine (ZYRTEC ALLERGY) 10 MG tablet Take 1 tablet (10 mg total) by mouth daily. 30 tablet 11    fluticasone (FLONASE) 50 MCG/ACT nasal spray Place 1 spray into both nostrils daily. 16 g 2    prenatal vitamin w/FE, FA (PRENATAL 1 + 1) 27-1 MG TABS tablet Take 1 tablet by mouth daily at 12 noon. 30 tablet 11    promethazine (PHENERGAN) 25 MG tablet Take 1 tablet (25 mg total) by mouth every 6 (six) hours as needed for nausea. 30 tablet 0    sertraline (ZOLOFT) 50 MG tablet Take 1 tablet (50 mg total) by mouth daily. (Patient not taking: Reported on 12/31/2022) 30 tablet 3    [DISCONTINUED] polyethylene glycol (MIRALAX / GLYCOLAX) 17 g packet Take 17 g by mouth daily. (Patient not taking: Reported on 12/31/2022) 28 each 0     ROS:  Review of Systems  Constitutional:  Negative for chills, fatigue and fever.  Eyes:  Negative for visual disturbance.  Respiratory:  Negative for shortness of breath.    Cardiovascular:  Negative for chest pain.  Gastrointestinal:  Positive for abdominal pain. Negative for nausea and vomiting.  Genitourinary:  Negative for difficulty urinating, dysuria,  flank pain, pelvic pain, vaginal bleeding, vaginal discharge and vaginal pain.  Musculoskeletal:  Positive for back pain.  Neurological:  Positive for headaches. Negative for dizziness.  Psychiatric/Behavioral: Negative.       I have reviewed patient's Past Medical Hx, Surgical Hx, Family Hx, Social Hx, medications and allergies.   Physical Exam  Patient Vitals for the past 24 hrs:  BP Temp Temp src Pulse Resp SpO2 Height Weight  05/30/23 1816 (!) 122/58 -- -- (!) 112 -- -- -- --  05/30/23 1801 119/62 -- -- (!) 114 -- -- -- --  05/30/23 1746 (!) 140/60 -- -- (!) 114 -- -- -- --  05/30/23 1716 125/66 -- -- (!) 113 -- -- -- --  05/30/23 1713 128/69 -- -- (!) 114 -- -- -- --  05/30/23 1644 139/75 -- -- (!) 126 17 98 % -- --  05/30/23 1642 -- 98.4 F (36.9 C) Oral -- -- -- -- --  05/30/23 1637 -- -- -- -- -- -- 5\' 6"  (1.676 m) (!) 143 kg   Constitutional: Well-developed, well-nourished female in no acute distress.  Cardiovascular: normal rate Respiratory: normal effort GI: Abd soft, non-tender, gravid appropriate for gestational age.  MS: Extremities nontender, no edema, normal ROM Neurologic: Alert and oriented x 4.  GU: Neg CVAT.  PELVIC EXAM:   Dilation: Closed Effacement (%): Thick Exam by:: Sharen Counter, CNM  FHT:  Baseline 150 , moderate variability, accelerations present, no decelerations Contractions: none on toco or to palpation   Labs: Results for orders placed or performed during the hospital encounter of 05/30/23 (from the past 24 hour(s))  Urinalysis, Routine w reflex microscopic -Urine, Clean Catch     Status: Abnormal   Collection Time: 05/30/23  4:57 PM  Result Value Ref Range   Color, Urine YELLOW YELLOW   APPearance HAZY (A) CLEAR   Specific Gravity, Urine 1.021  1.005 - 1.030   pH 5.0 5.0 - 8.0   Glucose, UA NEGATIVE NEGATIVE mg/dL   Hgb urine dipstick SMALL (A) NEGATIVE   Bilirubin Urine NEGATIVE NEGATIVE   Ketones, ur NEGATIVE NEGATIVE mg/dL   Protein, ur 604 (A) NEGATIVE mg/dL   Nitrite NEGATIVE NEGATIVE   Leukocytes,Ua NEGATIVE NEGATIVE   RBC / HPF 0-5 0 - 5 RBC/hpf   WBC, UA 0-5 0 - 5 WBC/hpf   Bacteria, UA FEW (A) NONE SEEN   Squamous Epithelial / HPF 6-10 0 - 5 /HPF   Mucus PRESENT    B/Positive/-- (01/11 1608)  Imaging:  [place] MFM FETAL BPP WO NON STRESS  Result Date: 05/27/2023 ----------------------------------------------------------------------  OBSTETRICS REPORT                    (Corrected Final 05/27/2023 02:59 pm) ---------------------------------------------------------------------- Patient Info  ID #:       [id number]                          D.O.B.:  1986-06-06 (37 yrs)  Name:       Anne [facility]             Visit Date: 05/24/2023 09:22 am ---------------------------------------------------------------------- Performed By  Attending:        Braxton Feathers DO       Ref. Address:     [address]  [place], [place]                                                             [address]  Performed By:     Reinaldo Raddle            Location:         [facility]                                     [facility]  Referred By:       Bing MD ---------------------------------------------------------------------- Orders  #  Description                           Code        Ordered By  1  [place] MFM FETAL BPP WO NON               76819.01    YU FANG     STRESS ----------------------------------------------------------------------  #  Order #                     Accession #                Episode #  1   [id number]                   [id number]                 [id number] ---------------------------------------------------------------------- Indications  Obesity complicating pregnancy (BMI 50)        O99.210 E66.9  Previous cesarean delivery, antepartum (x4)    O34.219  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  P. Cord Insertion:      Not well visualized  Amniotic Fluid  AFI FV:      Within normal limits  AFI Sum(cm)     %Tile       Largest Pocket(cm)  14.55           51          4.22  RUQ(cm)       RLQ(cm)       LUQ(cm)        LLQ(cm)  4.22          3.94          2.53           3.86 ---------------------------------------------------------------------- Biophysical Evaluation  Amniotic F.V:   Within normal limits       F. Tone:        Observed  F. Movement:    Observed                   Score:          8/8  F. Breathing:   Observed ---------------------------------------------------------------------- OB History  Gravidity:    8         Term:   3        Prem:   1        SAB:   3  TOP:          0        Living:  5 ---------------------------------------------------------------------- Gestational Age  LMP:           33w 2d        Date:  10/03/22                 EDD:   07/10/23  Best:          33w 2d     Det. By:  LMP  (10/03/22)          EDD:   07/10/23 ---------------------------------------------------------------------- Anatomy  Ventricles:            Appears normal         Bladder:                Appears normal  Stomach:               Appears normal, left                          sided ---------------------------------------------------------------------- Comments  The patient is here for a BPP for elevated BMI, gDMA1, hx of  PE and possible PAS. She is at 33w 2d. EDD of 07/10/2023  dated by: LMP  (10/03/22). She has no concerns today. She  is compliant with lovenox. Blood sugars are diet controlled.  Sonographic findings  Single intrauterine pregnancy.  Fetal cardiac activity: Observed.  Presentation: Cephalic.  Interval fetal anatomy appears normal.  Amniotic fluid volume: Within normal limits. AFI: 14.55 cm.  MVP: 4.22 cm.  Placenta: Anterior. The placenta was not well visualized on  today's ultrasound due to the maternal bladder lacking  adequate distention. There may be a small focal accreta  based on retroplacental myometrial thinning, but it is difficult  to assess due to poor image quality.  BPP: 8/8.  Recommendations  - The patient will be seen at [facility] next week re-  assessment  of the placenta and for delivery planning  - The placenta was not well visualized due to the bladder  lacking fluid. The pateint attempted to drink water but after 30  min the bladder was still empty sonographically. I recommend  the placental bladder interface be assessed next week at her  [place] with [facility].  - Continue weekly testing until delivery ----------------------------------------------------------------------                      Braxton Feathers, DO Electronically Signed Corrected Final Report  05/27/2023 02:59 pm ----------------------------------------------------------------------  [place] MFM OB COMP + 14 WK  Result Date: 05/20/2023 ----------------------------------------------------------------------  OBSTETRICS REPORT                       (Signed Final 05/20/2023 01:41 pm) ---------------------------------------------------------------------- Patient Info  ID #:       [id number]                          D.O.B.:  1986-08-19 (37 yrs)  Name:       Anne Richardson Anne Richardson             Visit  Date: 05/19/2023 12:42 pm ---------------------------------------------------------------------- Performed By  Attending:        Ma Rings MD         Ref. Address:     [address]                                                             [place], [place]                                                             [address]  Performed By:     Marcellina Millin       Location:         [facility]                                     [facility]  Referred By:      Billey Gosling  PICKENS MD ---------------------------------------------------------------------- Orders  #  Description                           Code        Ordered By  1  [place] MFM OB COMP + 14 WK                76805.01    YU FANG ----------------------------------------------------------------------  #  Order #                     Accession #                Episode #  1  [id number]                   [id number]                 [id number] ---------------------------------------------------------------------- Indications  Obesity complicating pregnancy (BMI 50)        O99.210 E66.9  Previous cesarean delivery, antepartum (x4)    O34.219  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            O43.213  Gestational diabetes in pregnancy, diet        O24.410  controlled  [redacted] weeks gestation of pregnancy                Z3A.32 ---------------------------------------------------------------------- Fetal Evaluation  Num Of Fetuses:         1  Fetal Heart Rate(bpm):  149  Cardiac Activity:       Observed  Presentation:           Cephalic  Amniotic Fluid  AFI FV:      Within normal limits  AFI Sum(cm)     %Tile       Largest Pocket(cm)  12.62           37          4.99  RUQ(cm)       RLQ(cm)       LUQ(cm)        LLQ(cm)  4.99          2.66          2.38           2.59  ---------------------------------------------------------------------- Biometry  BPD:      70.5  mm     G. Age:  28w 2d        < 1  %    CI:        70.14   %    70 - 86                                                          FL/HC:      22.4   %    19.9 - 21.5  HC:      268.5  mm     G. Age:  29w 2d        < 1  %    HC/AC:      0.95        0.96 - 1.11  AC:      281.3  mm     G. Age:  32w 1d  38  %    FL/BPD:     85.2   %    71 - 87  FL:       60.1  mm     G. Age:  31w 2d         10  %    FL/AC:      21.4   %    20 - 24  Est. FW:    1731  gm    3 lb 13 oz      10  % ---------------------------------------------------------------------- OB History  Gravidity:    8         Term:   3        Prem:   1        SAB:   3  TOP:          0        Living:  5 ---------------------------------------------------------------------- Gestational Age  LMP:           32w 4d        Date:  10/03/22                 EDD:   07/10/23  U/S Today:     30w 2d                                        EDD:   07/26/23  Best:          32w 4d     Det. By:  LMP  (10/03/22)          EDD:   07/10/23 ---------------------------------------------------------------------- Doppler - Fetal Vessels  Umbilical Artery   S/D     %tile      RI    %tile      PI    %tile     PSV    ADFV    RDFV                                                     (cm/s)   3.03       73    0.67       77    1.03       76    45.33      No      No ---------------------------------------------------------------------- Comments  Timber Dunkley was seen due to maternal obesity with a  BMI of 50 and possible placenta accreta based on  ultrasounds performed earlier in her pregnancy.  The patient  has 4 prior cesarean deliveries.  She also has a history of a  prior pulmonary embolus and is being treated with daily  Lovenox.  Her pregnancy has also been complicated by diet-  controlled gestational diabetes.  The patient had a prior consultation with MFM at [facility].  A possible focal  accreta was suspected.  Due to  transportation issues, she has not been able to follow-up with  the physicians at [facility].  The overall EFW obtained today of 3 pounds 13 ounces  measures at the 10th percentile for her gestational age.  There was normal amniotic fluid noted today.  Due to extreme maternal body habitus, the fetal head  measurements were  difficult to obtain.  The views of the fetal anatomy were unable to be fully  visualized today due to her advanced gestational age and  extreme maternal body habitus.  A normal-appearing anterior placenta is noted.  There were  no signs of lacunae in the placenta.  There appears to be a  clear delineation of the placenta and the attached uterine  wall.  There were no signs of placenta previa noted.  The patient was advised that based on today's ultrasound  findings, my suspicion for placenta accreta is low.  She was advised that not all cases of placenta accreta can  be detected via ultrasound.  Her main risk factors for placenta  accreta spectrum is her history of 4 prior cesarean deliveries  and the fact that the placenta is located in the area of her  prior cesarean section scar.  The patient reports that as she has already established care  with the physicians at [facility], she would prefer to  deliver at [facility].  She was advised to contact the MFM  office at [facility] to set up another appointment with  them soon.  Due to maternal obesity, she should continue weekly fetal  testing until delivery.  A BPP has been scheduled for her in  our office next week.  However if she can be seen at [facility] next week, she will cancel the appointment in our  office.  The patient stated that all of her questions were answered  today.  A total of 45 minutes was spent counseling and coordinating  the care for this patient.  Greater than 50% of the time was  spent in direct face-to-face contact. ----------------------------------------------------------------------                    Ma Rings, MD Electronically Signed Final Report   05/20/2023 01:41 pm ----------------------------------------------------------------------   MAU Course/MDM: Orders Placed This Encounter  Procedures   Urinalysis, Routine w reflex microscopic -Urine, Clean Catch   Discharge patient    Meds ordered this encounter  Medications   acetaminophen (TYLENOL) tablet 1,000 mg   polyethylene glycol (MIRALAX / GLYCOLAX) 17 g packet    Sig: Take 17 g by mouth daily.    Dispense:  28 each    Refill:  0    Order Specific Question:   Supervising Provider    Answer:   Lennart Pall [[id number]]   metoCLOPramide (REGLAN) 10 MG tablet    Sig: Take 1 tablet (10 mg total) by mouth every 6 (six) hours as needed for nausea (or headache).    Dispense:  30 tablet    Refill:  0    Order Specific Question:   Supervising Provider    Answer:   Lennart Pall [[id number]]     NST reviewed and reactive Cervix closed/thick/high, no evidence of preterm labor Tylenol 1000 mg PO for h/a Pt h/a not resolved but improved.  Pt desires discharge from MAU. Given close follow up with appt tomorrow with [facility], and PEC precautions given, D/C home with Rx for Reglan.  Pt to take OTC Tylenol PRN plus Reglan. Return to MAU as needed for emergencies  Assessment: 1. Previous cesarean section   2. Placenta accreta, third trimester   3. [redacted] weeks gestation of pregnancy   4. Pregnancy headache in third trimester   5. Threatened preterm labor, third trimester     Plan: Discharge home Labor precautions and fetal kick counts  Follow-up Information  [facility] provider Follow up.   Why: As scheduled        [facility] Follow up.   Specialty: Obstetrics and Gynecology Why: As needed for emergencies Contact information: [address] [id number] [address]he[place] [address] [phone]               Allergies as of 05/30/2023        Reactions   Penicillins Itching, Swelling   Patient tolerates: amoxicillin, cefazolin, and cephalexin Has patient had a PCN reaction causing immediate rash, facial/tongue/throat swelling, SOB or lightheadedness with hypotension: No Has patient had a PCN reaction causing severe rash involving mucus membranes or skin necrosis: No Has patient had a PCN reaction that required hospitalization No Has patient had a PCN reaction occurring within the last 10 years: No If all of the above answers are "NO", then may proceed with Cephalosporin use.   Pork-derived Products Other (See Comments)   Religious reasons         Medication List     TAKE these medications    acetaminophen 500 MG tablet Commonly known as: TYLENOL Take 2 tablets (1,000 mg total) by mouth every 6 (six) hours as needed for mild pain or moderate pain.   albuterol 108 (90 Base) MCG/ACT inhaler Commonly known as: VENTOLIN HFA Inhale 2 puffs into the lungs every 6 (six) hours as needed for wheezing or shortness of breath.   cetirizine 10 MG tablet Commonly known as: ZyrTEC Allergy Take 1 tablet (10 mg total) by mouth daily.   enoxaparin 60 MG/0.6ML injection Commonly known as: LOVENOX Inject 0.6 mLs (60 mg total) into the skin daily.   fluticasone 50 MCG/ACT nasal spray Commonly known as: FLONASE Place 1 spray into both nostrils daily.   metoCLOPramide 10 MG tablet Commonly known as: Reglan Take 1 tablet (10 mg total) by mouth every 6 (six) hours as needed for nausea (or headache).   polyethylene glycol 17 g packet Commonly known as: MIRALAX / GLYCOLAX Take 17 g by mouth daily.   prenatal vitamin w/FE, FA 27-1 MG Tabs tablet Take 1 tablet by mouth daily at 12 noon.   promethazine 25 MG tablet Commonly known as: PHENERGAN Take 1 tablet (25 mg total) by mouth every 6 (six) hours as needed for nausea.   sertraline 50 MG tablet Commonly known as: ZOLOFT Take 1 tablet (50 mg total) by mouth daily.         Sharen Counter Certified Nurse-Midwife 05/30/2023 6:46 PM

## 2023-05-30 NOTE — MAU Note (Addendum)
.  Anne Richardson is a 37 y.o. at [redacted]w[redacted]d here in MAU reporting: HA, CTX, pelvic pain, and abdominal pain for the past two days. She reports the pains are not going away so she wanted to come in for evaluation. HA is anterior and behind her eyes. Pelvic pain occurs with movement. Abdominal pain happens in three areas. She reports there is a constant discomfort in her whole abdomen, a tightening pain from the right to the left in her upper abdomen that is intermittent, and occasional CTX in her lower abdomen. Has not taken anything for her pain. She reports she has also had a "popping" sensation under her right breast when she moves. Denies VB or LOF. +FM.   Has not taken her Lovenox injection today. Last PO: on the way here  Patient reports coming mostly for her HA and reports her aunt scared her into thinking this was labor.  Red Chart. AMA. Concern for accreta, last [place] on 6/3 unable to see placenta clearly. Resolved placenta previa 5/29. Hx Pre-E. Previous C/S x4.  Next OB appointment on 6/14 to confirmed transfer to [facility] for OB care. Patient desires to deliver there.  Onset of complaint:  Pain score:  8/10 HA 10/10 CTX 9/10 entire abdomen constant pain    FHT: 155 initial external Lab orders placed from triage: UA

## 2023-05-31 ENCOUNTER — Ambulatory Visit: Payer: Medicaid Other

## 2023-06-04 ENCOUNTER — Encounter: Payer: Self-pay | Admitting: Obstetrics and Gynecology

## 2023-06-04 ENCOUNTER — Ambulatory Visit (INDEPENDENT_AMBULATORY_CARE_PROVIDER_SITE_OTHER): Payer: Medicaid Other | Admitting: Obstetrics and Gynecology

## 2023-06-04 ENCOUNTER — Other Ambulatory Visit: Payer: Self-pay

## 2023-06-04 VITALS — BP 135/81 | HR 110 | Wt 316.7 lb

## 2023-06-04 DIAGNOSIS — F250 Schizoaffective disorder, bipolar type: Secondary | ICD-10-CM

## 2023-06-04 DIAGNOSIS — Z8759 Personal history of other complications of pregnancy, childbirth and the puerperium: Secondary | ICD-10-CM

## 2023-06-04 DIAGNOSIS — O99210 Obesity complicating pregnancy, unspecified trimester: Secondary | ICD-10-CM

## 2023-06-04 DIAGNOSIS — Z86711 Personal history of pulmonary embolism: Secondary | ICD-10-CM

## 2023-06-04 DIAGNOSIS — Z7901 Long term (current) use of anticoagulants: Secondary | ICD-10-CM | POA: Insufficient documentation

## 2023-06-04 DIAGNOSIS — Z6841 Body Mass Index (BMI) 40.0 and over, adult: Secondary | ICD-10-CM

## 2023-06-04 DIAGNOSIS — Z3A34 34 weeks gestation of pregnancy: Secondary | ICD-10-CM

## 2023-06-04 DIAGNOSIS — O43212 Placenta accreta, second trimester: Secondary | ICD-10-CM

## 2023-06-04 DIAGNOSIS — Z98891 History of uterine scar from previous surgery: Secondary | ICD-10-CM

## 2023-06-04 DIAGNOSIS — O43213 Placenta accreta, third trimester: Secondary | ICD-10-CM

## 2023-06-04 DIAGNOSIS — O0991 Supervision of high risk pregnancy, unspecified, first trimester: Secondary | ICD-10-CM

## 2023-06-04 DIAGNOSIS — O2343 Unspecified infection of urinary tract in pregnancy, third trimester: Secondary | ICD-10-CM

## 2023-06-04 DIAGNOSIS — O0993 Supervision of high risk pregnancy, unspecified, third trimester: Secondary | ICD-10-CM

## 2023-06-04 DIAGNOSIS — O09523 Supervision of elderly multigravida, third trimester: Secondary | ICD-10-CM

## 2023-06-04 DIAGNOSIS — O99213 Obesity complicating pregnancy, third trimester: Secondary | ICD-10-CM

## 2023-06-04 HISTORY — DX: Long term (current) use of anticoagulants: Z79.01

## 2023-06-04 NOTE — Progress Notes (Signed)
PRENATAL VISIT NOTE  Subjective:  Anne Richardson is a 37 y.o. [phone] at [redacted]w[redacted]d being seen today for ongoing prenatal care.  She is currently monitored for the following issues for this high-risk pregnancy and has Obesity in pregnancy; Previous cesarean section; Schizoaffective disorder, bipolar type (HCC); PTSD (post-traumatic stress disorder); History of pulmonary embolism; AMA (advanced maternal age) multigravida 35+; BMI 50.0-59.9, adult (HCC); Supervision of high risk pregnancy in first trimester; UTI in pregnancy; History of gestational hypertension; Abnormal fetal ultrasound; 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; RED Chart Rounds pt; and Anticoagulated on their problem list.  Patient reports  still some difficulty hearing after an ear infection .  Contractions: Irritability. Vag. Bleeding: None.  Movement: Present. Denies leaking of fluid.   The following portions of the patient's history were reviewed and updated as appropriate: allergies, current medications, past family history, past medical history, past social history, past surgical history and problem list.   Objective:   Vitals:   06/04/23 0921  BP: 135/81  Pulse: (!) 110  Weight: (!) 316 lb 11.2 oz (143.7 kg)    Fetal Status: Fetal Heart Rate (bpm): 157   Movement: Present     General:  Alert, oriented and cooperative. Patient is in no acute distress.  Skin: Skin is warm and dry. No rash noted.   Cardiovascular: Normal heart rate noted  Respiratory: Normal respiratory effort, no problems with respiration noted  Abdomen: Soft, gravid, appropriate for gestational age.  Pain/Pressure: Present     Pelvic: Cervical exam deferred        Extremities: Normal range of motion.  Edema: Trace  Mental Status: Normal mood and affect. Normal behavior. Normal judgment and thought content.   Assessment and Plan:  Pregnancy: [id number] at [redacted]w[redacted]d 1. Urinary tract infection in mother during third trimester of pregnancy Toc today -  Culture, OB Urine  2. [redacted] weeks gestation of pregnancy GBS next visit - CBC  3. Concern for placenta accreta in second trimester Patient had 6/10 [place] and no concern for accreta on scan and okay to deliver at [facility], which patient prefers.  Baby was 24%, 2219gm, ac 74%, afi 18, bpp 8/8. I told her that u/s is not 100% and her c-section will still be very high risk with potential for hysterectomy, bleeding, need for transfusion, etc, which patient understands.  Pt confirms on qod iron. Will check repeat cbc today    Latest Ref Rng & Units 04/21/2023    9:49 AM 03/04/2023    3:20 AM 12/31/2022    4:08 PM  CBC  WBC 3.4 - 10.8 x10E3/uL 14.4  14.2  11.4   Hemoglobin 11.1 - 15.9 g/dL 19.1  47.8  29.5   Hematocrit 34.0 - 46.6 % 35.9  35.4  34.2   Platelets 150 - 450 x10E3/uL 279  260  343   - CBC  4. Supervision of high risk pregnancy in first trimester BTL papers signed 5/1 - CBC  5. Previous cesarean section - CBC  6. History of pulmonary embolism Confirms on ppx qday lovenox - CBC  7. Anticoagulated - CBC  8. BMI 50.0-59.9, adult (HCC) Weight stable  9. Obesity in pregnancy  10. Multigravida of advanced maternal age in third trimester Low risk panorama.  11. History of gestational hypertension Normal BP. Continue low dose asa.   12. Schizoaffective disorder, bipolar type (HCC) No issues on zoloft  Preterm labor symptoms and general obstetric precautions including but not limited to vaginal bleeding, contractions, leaking of  fluid and fetal movement were reviewed in detail with the patient. Please refer to After Visit Summary for other counseling recommendations.   Return in about 10 days (around 06/14/2023) for in person, md visit, high risk ob.  Future Appointments  Date Time Provider Department Center  06/09/2023  9:30 AM [facility] NURSE [facility]  06/09/2023  9:45 AM [facility] US5 [facility]    [place] Bing, MD

## 2023-06-04 NOTE — Progress Notes (Signed)
Pt reports still having problems with hearing

## 2023-06-05 LAB — CBC
Hematocrit: 36.6 % (ref 34.0–46.6)
Hemoglobin: 11.6 g/dL (ref 11.1–15.9)
MCH: 29.1 pg (ref 26.6–33.0)
MCHC: 31.7 g/dL (ref 31.5–35.7)
MCV: 92 fL (ref 79–97)
Platelets: 308 10*3/uL (ref 150–450)
RBC: 3.98 x10E6/uL (ref 3.77–5.28)
RDW: 15.1 % (ref 11.7–15.4)
WBC: 13.3 10*3/uL — ABNORMAL HIGH (ref 3.4–10.8)

## 2023-06-06 LAB — CULTURE, OB URINE

## 2023-06-06 LAB — URINE CULTURE, OB REFLEX

## 2023-06-09 ENCOUNTER — Other Ambulatory Visit: Payer: Self-pay | Admitting: *Deleted

## 2023-06-09 ENCOUNTER — Ambulatory Visit (HOSPITAL_BASED_OUTPATIENT_CLINIC_OR_DEPARTMENT_OTHER): Payer: Medicaid Other

## 2023-06-09 ENCOUNTER — Encounter: Payer: Self-pay | Admitting: *Deleted

## 2023-06-09 ENCOUNTER — Ambulatory Visit: Payer: Medicaid Other | Attending: Obstetrics | Admitting: *Deleted

## 2023-06-09 VITALS — BP 126/77 | HR 116

## 2023-06-09 DIAGNOSIS — Z7901 Long term (current) use of anticoagulants: Secondary | ICD-10-CM | POA: Insufficient documentation

## 2023-06-09 DIAGNOSIS — O34219 Maternal care for unspecified type scar from previous cesarean delivery: Secondary | ICD-10-CM

## 2023-06-09 DIAGNOSIS — O99213 Obesity complicating pregnancy, third trimester: Secondary | ICD-10-CM | POA: Insufficient documentation

## 2023-06-09 DIAGNOSIS — Z3A35 35 weeks gestation of pregnancy: Secondary | ICD-10-CM

## 2023-06-09 DIAGNOSIS — E669 Obesity, unspecified: Secondary | ICD-10-CM

## 2023-06-09 DIAGNOSIS — O24410 Gestational diabetes mellitus in pregnancy, diet controlled: Secondary | ICD-10-CM | POA: Insufficient documentation

## 2023-06-09 DIAGNOSIS — O43213 Placenta accreta, third trimester: Secondary | ICD-10-CM | POA: Insufficient documentation

## 2023-06-09 DIAGNOSIS — O99210 Obesity complicating pregnancy, unspecified trimester: Secondary | ICD-10-CM

## 2023-06-09 DIAGNOSIS — O358XX0 Maternal care for other (suspected) fetal abnormality and damage, not applicable or unspecified: Secondary | ICD-10-CM

## 2023-06-09 DIAGNOSIS — Z86711 Personal history of pulmonary embolism: Secondary | ICD-10-CM | POA: Diagnosis not present

## 2023-06-09 DIAGNOSIS — O24419 Gestational diabetes mellitus in pregnancy, unspecified control: Secondary | ICD-10-CM

## 2023-06-14 ENCOUNTER — Other Ambulatory Visit: Payer: Self-pay

## 2023-06-14 ENCOUNTER — Ambulatory Visit (INDEPENDENT_AMBULATORY_CARE_PROVIDER_SITE_OTHER): Payer: Medicaid Other | Admitting: Obstetrics and Gynecology

## 2023-06-14 VITALS — BP 130/79 | HR 123 | Wt 315.9 lb

## 2023-06-14 DIAGNOSIS — K439 Ventral hernia without obstruction or gangrene: Secondary | ICD-10-CM

## 2023-06-14 DIAGNOSIS — O43212 Placenta accreta, second trimester: Secondary | ICD-10-CM

## 2023-06-14 DIAGNOSIS — O283 Abnormal ultrasonic finding on antenatal screening of mother: Secondary | ICD-10-CM

## 2023-06-14 DIAGNOSIS — Z3A36 36 weeks gestation of pregnancy: Secondary | ICD-10-CM

## 2023-06-14 DIAGNOSIS — F250 Schizoaffective disorder, bipolar type: Secondary | ICD-10-CM

## 2023-06-14 DIAGNOSIS — O43213 Placenta accreta, third trimester: Secondary | ICD-10-CM

## 2023-06-14 DIAGNOSIS — Z86711 Personal history of pulmonary embolism: Secondary | ICD-10-CM

## 2023-06-14 DIAGNOSIS — Z98891 History of uterine scar from previous surgery: Secondary | ICD-10-CM

## 2023-06-14 DIAGNOSIS — Z6841 Body Mass Index (BMI) 40.0 and over, adult: Secondary | ICD-10-CM

## 2023-06-14 DIAGNOSIS — Z7901 Long term (current) use of anticoagulants: Secondary | ICD-10-CM

## 2023-06-14 DIAGNOSIS — Z8759 Personal history of other complications of pregnancy, childbirth and the puerperium: Secondary | ICD-10-CM

## 2023-06-14 DIAGNOSIS — O0993 Supervision of high risk pregnancy, unspecified, third trimester: Secondary | ICD-10-CM

## 2023-06-14 DIAGNOSIS — O09523 Supervision of elderly multigravida, third trimester: Secondary | ICD-10-CM

## 2023-06-14 DIAGNOSIS — O0991 Supervision of high risk pregnancy, unspecified, first trimester: Secondary | ICD-10-CM

## 2023-06-14 NOTE — Progress Notes (Signed)
PRENATAL VISIT NOTE  Subjective:  Anne Richardson is a 37 y.o. [phone] at [redacted]w[redacted]d being seen today for ongoing prenatal care.  She is currently monitored for the following issues for this high-risk pregnancy and has Previous cesarean section; Schizoaffective disorder, bipolar type (HCC); PTSD (post-traumatic stress disorder); History of pulmonary embolism; AMA (advanced maternal age) multigravida 35+; BMI 50.0-59.9, adult (HCC); Supervision of high risk pregnancy in first trimester; History of gestational hypertension; Abnormal fetal ultrasound; 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; and RED Chart Rounds pt on their problem list.  Patient reports  concern over having an umbilical hernia .  Contractions: Irritability.  .  Movement: Present. Denies leaking of fluid.   The following portions of the patient's history were reviewed and updated as appropriate: allergies, current medications, past family history, past medical history, past social history, past surgical history and problem list.   Objective:   Vitals:   06/14/23 1459  BP: 130/79  Pulse: (!) 123  Weight: (!) 315 lb 14.4 oz (143.3 kg)    Fetal Status: Fetal Heart Rate (bpm): 143   Movement: Present     General:  Alert, oriented and cooperative. Patient is in no acute distress.  Skin: Skin is warm and dry. No rash noted.   Cardiovascular: Normal heart rate noted  Respiratory: Normal respiratory effort, no problems with respiration noted  Abdomen: 3-4cm supraumbilical hernia, nttp, easily reducible. Soft, gravid, appropriate for gestational age.  Pain/Pressure: Present     Pelvic: Cervical exam deferred        Extremities: Normal range of motion.  Edema: Mild pitting, slight indentation  Mental Status: Normal mood and affect. Normal behavior. Normal judgment and thought content.   Assessment and Plan:  Pregnancy: [id number] at [redacted]w[redacted]d 1. Supervision of high risk pregnancy in first trimester Routine care. BTL papers signed 5/1 -  GC/Chlamydia probe amp ([place])not at [facility] - Strep Gp B Culture+Rflx  2. [redacted] weeks gestation of pregnancy  3. Multigravida of advanced maternal age in third trimester No issues  4. History of pulmonary embolism Continue on qday lovenox Continue qwk testing 6/19: afi 18, bpp 8/8  5. Previous cesarean section For rpt on 7/16  6. History of gestational hypertension No issues. Continue low dose asa  7. Abnormal fetal ultrasound Borderline fgr at last u/s. Has u/s tomorrow and likely a repeat growth 5/29: 10%, 1731gm, ac 38%, normal UA dopplers.   8. BMI 50.0-59.9, adult (HCC) Weight stable  9. Concern for placenta accreta in second trimester Patient had 6/10 [place] and no concern for accreta on scan and okay to deliver at [facility], which patient prefers. I told her that u/s is not 100% and her c-section will still be very high risk with potential for hysterectomy, bleeding, need for transfusion, etc, which patient understands.     Latest Ref Rng & Units 06/04/2023    9:51 AM 04/21/2023    9:49 AM 03/04/2023    3:20 AM  CBC  WBC 3.4 - 10.8 x10E3/uL 13.3  14.4  14.2   Hemoglobin 11.1 - 15.9 g/dL 19.1  47.8  29.5   Hematocrit 34.0 - 46.6 % 36.6  35.9  35.4   Platelets 150 - 450 x10E3/uL 308  279  260    10. Schizoaffective disorder, bipolar type (HCC) No issues on zoloft  11. Supraumbilical Hernia D/w her re: ED precautions and cna refer to Gen Surg after her pregnancy for next steps   Preterm labor symptoms and general obstetric precautions  including but not limited to vaginal bleeding, contractions, leaking of fluid and fetal movement were reviewed in detail with the patient. Please refer to After Visit Summary for other counseling recommendations.   Return in about 8 days (around 06/22/2023) for in person, md visit, high risk ob.  Future Appointments  Date Time Provider Department Center  06/15/2023  2:30 PM [facility] NURSE [facility]  06/15/2023  2:45 PM [facility] US5  [facility]  06/22/2023 10:15 AM [facility] NURSE [facility]  06/22/2023 10:30 AM [facility] US2 [facility]    [place] Bing, MD

## 2023-06-15 ENCOUNTER — Ambulatory Visit: Payer: Medicaid Other | Attending: Obstetrics

## 2023-06-15 ENCOUNTER — Ambulatory Visit: Payer: Medicaid Other | Admitting: *Deleted

## 2023-06-15 ENCOUNTER — Encounter: Payer: Self-pay | Admitting: *Deleted

## 2023-06-15 VITALS — BP 130/67 | HR 107

## 2023-06-15 DIAGNOSIS — Z86711 Personal history of pulmonary embolism: Secondary | ICD-10-CM

## 2023-06-15 DIAGNOSIS — O24410 Gestational diabetes mellitus in pregnancy, diet controlled: Secondary | ICD-10-CM | POA: Diagnosis not present

## 2023-06-15 DIAGNOSIS — E669 Obesity, unspecified: Secondary | ICD-10-CM

## 2023-06-15 DIAGNOSIS — O24419 Gestational diabetes mellitus in pregnancy, unspecified control: Secondary | ICD-10-CM | POA: Diagnosis present

## 2023-06-15 DIAGNOSIS — Z3A36 36 weeks gestation of pregnancy: Secondary | ICD-10-CM

## 2023-06-15 DIAGNOSIS — O99213 Obesity complicating pregnancy, third trimester: Secondary | ICD-10-CM | POA: Diagnosis not present

## 2023-06-15 DIAGNOSIS — Z7189 Other specified counseling: Secondary | ICD-10-CM | POA: Diagnosis present

## 2023-06-15 DIAGNOSIS — O34219 Maternal care for unspecified type scar from previous cesarean delivery: Secondary | ICD-10-CM | POA: Diagnosis present

## 2023-06-15 DIAGNOSIS — Z7901 Long term (current) use of anticoagulants: Secondary | ICD-10-CM

## 2023-06-15 LAB — GC/CHLAMYDIA PROBE AMP (~~LOC~~) NOT AT ARMC
Chlamydia: NEGATIVE
Comment: NEGATIVE
Comment: NORMAL
Neisseria Gonorrhea: NEGATIVE

## 2023-06-15 NOTE — Progress Notes (Signed)
Patient states she has not felt fetal movement since 1230AM.

## 2023-06-19 LAB — STREP GP B CULTURE+RFLX: Strep Gp B Culture+Rflx: NEGATIVE

## 2023-06-22 ENCOUNTER — Ambulatory Visit: Payer: MEDICAID | Attending: Obstetrics

## 2023-06-22 ENCOUNTER — Ambulatory Visit: Payer: MEDICAID

## 2023-06-25 ENCOUNTER — Encounter (HOSPITAL_COMMUNITY)
Admission: AD | Disposition: A | Discharge status: Home / Self Care | Payer: Self-pay | Source: Home / Self Care | Attending: Obstetrics and Gynecology

## 2023-06-25 ENCOUNTER — Ambulatory Visit (INDEPENDENT_AMBULATORY_CARE_PROVIDER_SITE_OTHER): Payer: MEDICAID | Admitting: Obstetrics & Gynecology

## 2023-06-25 ENCOUNTER — Telehealth (HOSPITAL_COMMUNITY): Payer: Self-pay | Admitting: *Deleted

## 2023-06-25 ENCOUNTER — Other Ambulatory Visit: Payer: Self-pay

## 2023-06-25 ENCOUNTER — Inpatient Hospital Stay (HOSPITAL_COMMUNITY)
Admission: AD | Admit: 2023-06-25 | Discharge: 2023-06-28 | DRG: 788 | Disposition: A | Discharge status: Home / Self Care | Payer: MEDICAID | Attending: Obstetrics and Gynecology | Admitting: Obstetrics and Gynecology

## 2023-06-25 ENCOUNTER — Encounter (HOSPITAL_COMMUNITY): Payer: Self-pay

## 2023-06-25 ENCOUNTER — Encounter (HOSPITAL_COMMUNITY): Payer: Self-pay | Admitting: Family Medicine

## 2023-06-25 VITALS — BP 140/85 | HR 121 | Wt 316.3 lb

## 2023-06-25 DIAGNOSIS — Z3A37 37 weeks gestation of pregnancy: Secondary | ICD-10-CM

## 2023-06-25 DIAGNOSIS — O0991 Supervision of high risk pregnancy, unspecified, first trimester: Secondary | ICD-10-CM

## 2023-06-25 DIAGNOSIS — O09523 Supervision of elderly multigravida, third trimester: Secondary | ICD-10-CM

## 2023-06-25 DIAGNOSIS — O99214 Obesity complicating childbirth: Secondary | ICD-10-CM | POA: Diagnosis present

## 2023-06-25 DIAGNOSIS — O34211 Maternal care for low transverse scar from previous cesarean delivery: Secondary | ICD-10-CM | POA: Diagnosis present

## 2023-06-25 DIAGNOSIS — Z86711 Personal history of pulmonary embolism: Secondary | ICD-10-CM

## 2023-06-25 DIAGNOSIS — O0993 Supervision of high risk pregnancy, unspecified, third trimester: Secondary | ICD-10-CM

## 2023-06-25 DIAGNOSIS — F17290 Nicotine dependence, other tobacco product, uncomplicated: Secondary | ICD-10-CM | POA: Diagnosis present

## 2023-06-25 DIAGNOSIS — Z98891 History of uterine scar from previous surgery: Secondary | ICD-10-CM

## 2023-06-25 DIAGNOSIS — Z6841 Body Mass Index (BMI) 40.0 and over, adult: Secondary | ICD-10-CM

## 2023-06-25 DIAGNOSIS — O1410 Severe pre-eclampsia, unspecified trimester: Secondary | ICD-10-CM

## 2023-06-25 DIAGNOSIS — Z880 Allergy status to penicillin: Secondary | ICD-10-CM

## 2023-06-25 DIAGNOSIS — Z8759 Personal history of other complications of pregnancy, childbirth and the puerperium: Secondary | ICD-10-CM

## 2023-06-25 DIAGNOSIS — O1490 Unspecified pre-eclampsia, unspecified trimester: Secondary | ICD-10-CM | POA: Diagnosis present

## 2023-06-25 DIAGNOSIS — Z7189 Other specified counseling: Principal | ICD-10-CM

## 2023-06-25 DIAGNOSIS — O1413 Severe pre-eclampsia, third trimester: Secondary | ICD-10-CM

## 2023-06-25 DIAGNOSIS — O99334 Smoking (tobacco) complicating childbirth: Secondary | ICD-10-CM | POA: Diagnosis present

## 2023-06-25 DIAGNOSIS — O1414 Severe pre-eclampsia complicating childbirth: Principal | ICD-10-CM | POA: Diagnosis present

## 2023-06-25 LAB — HIV ANTIBODY (ROUTINE TESTING W REFLEX): HIV Screen 4th Generation wRfx: NONREACTIVE

## 2023-06-25 LAB — CBC
HCT: 37.7 % (ref 36.0–46.0)
Hemoglobin: 12.2 g/dL (ref 12.0–15.0)
MCH: 30.2 pg (ref 26.0–34.0)
MCHC: 32.4 g/dL (ref 30.0–36.0)
MCV: 93.3 fL (ref 80.0–100.0)
Platelets: 348 10*3/uL (ref 150–400)
RBC: 4.04 MIL/uL (ref 3.87–5.11)
RDW: 15.1 % (ref 11.5–15.5)
WBC: 12.4 10*3/uL — ABNORMAL HIGH (ref 4.0–10.5)
nRBC: 0 % (ref 0.0–0.2)

## 2023-06-25 LAB — COMPREHENSIVE METABOLIC PANEL
ALT: 14 U/L (ref 0–44)
AST: 20 U/L (ref 15–41)
Albumin: 2.5 g/dL — ABNORMAL LOW (ref 3.5–5.0)
Alkaline Phosphatase: 106 U/L (ref 38–126)
Anion gap: 9 (ref 5–15)
BUN: 8 mg/dL (ref 6–20)
CO2: 22 mmol/L (ref 22–32)
Calcium: 9.8 mg/dL (ref 8.9–10.3)
Chloride: 103 mmol/L (ref 98–111)
Creatinine, Ser: 0.7 mg/dL (ref 0.44–1.00)
GFR, Estimated: >60 mL/min (ref >60)
Glucose, Bld: 120 mg/dL — ABNORMAL HIGH (ref 70–99)
Potassium: 4 mmol/L (ref 3.5–5.1)
Sodium: 134 mmol/L — ABNORMAL LOW (ref 135–145)
Total Bilirubin: 0.4 mg/dL (ref 0.3–1.2)
Total Protein: 6.3 g/dL — ABNORMAL LOW (ref 6.5–8.1)

## 2023-06-25 LAB — TYPE AND SCREEN

## 2023-06-25 LAB — PROTEIN / CREATININE RATIO, URINE
Creatinine, Urine: 211 mg/dL
Protein Creatinine Ratio: 0.6 mg/mg{Cre} — ABNORMAL HIGH (ref 0.00–0.15)
Total Protein, Urine: 127 mg/dL

## 2023-06-25 LAB — PREPARE RBC (CROSSMATCH)

## 2023-06-25 LAB — BPAM RBC

## 2023-06-25 SURGERY — Surgical Case
Anesthesia: Choice

## 2023-06-25 MED ORDER — LACTATED RINGERS IV SOLN: 125.0000 mL/h | INTRAVENOUS | Status: AC

## 2023-06-25 MED ORDER — MAGNESIUM SULFATE BOLUS VIA INFUSION
4.0000 g | Freq: Once | INTRAVENOUS | Status: AC
  Administered 2023-06-25: 4 g via INTRAVENOUS
  Filled 2023-06-25: qty 1000

## 2023-06-25 MED ORDER — CALCIUM CARBONATE ANTACID 500 MG PO CHEW: 2.0000 | CHEWABLE_TABLET | ORAL | Status: DC | PRN

## 2023-06-25 MED ORDER — SOD CITRATE-CITRIC ACID 500-334 MG/5ML PO SOLN: 30.0000 mL | Freq: Once | ORAL | Status: DC

## 2023-06-25 MED ORDER — PRENATAL MULTIVITAMIN CH: 1.0000 | ORAL_TABLET | Freq: Every day | ORAL | Status: DC

## 2023-06-25 MED ORDER — LABETALOL HCL 5 MG/ML IV SOLN: 40.0000 mg | INTRAVENOUS | Status: DC | PRN

## 2023-06-25 MED ORDER — CLINDAMYCIN PHOSPHATE 900 MG/50ML IV SOLN
900.0000 mg | INTRAVENOUS | Status: DC
  Filled 2023-06-25: qty 50

## 2023-06-25 MED ORDER — MAGNESIUM SULFATE 40 GM/1000ML IV SOLN
2.0000 g/h | INTRAVENOUS | Status: AC
  Administered 2023-06-25 – 2023-06-26 (×2): 2 g/h via INTRAVENOUS
  Filled 2023-06-25 (×4): qty 1000

## 2023-06-25 MED ORDER — LABETALOL HCL 5 MG/ML IV SOLN: 20.0000 mg | INTRAVENOUS | Status: DC | PRN

## 2023-06-25 MED ORDER — HYDRALAZINE HCL 20 MG/ML IJ SOLN: 10.0000 mg | INTRAMUSCULAR | Status: DC | PRN

## 2023-06-25 MED ORDER — METOCLOPRAMIDE HCL 5 MG/ML IJ SOLN
10.0000 mg | Freq: Once | INTRAMUSCULAR | Status: AC
  Administered 2023-06-25: 10 mg via INTRAVENOUS
  Filled 2023-06-25: qty 2

## 2023-06-25 MED ORDER — DOCUSATE SODIUM 100 MG PO CAPS
100.0000 mg | ORAL_CAPSULE | Freq: Every day | ORAL | Status: DC
  Administered 2023-06-27 – 2023-06-28 (×2): 100 mg via ORAL
  Filled 2023-06-25 (×2): qty 1

## 2023-06-25 MED ORDER — GENTAMICIN SULFATE 40 MG/ML IJ SOLN
5.0000 mg/kg | INTRAVENOUS | Status: DC
  Filled 2023-06-25: qty 11.75

## 2023-06-25 MED ORDER — HYDROCORTISONE (PERIANAL) 2.5 % EX CREA
TOPICAL_CREAM | Freq: Two times a day (BID) | CUTANEOUS | 2 refills | Status: AC
Start: 2023-06-25 — End: ?

## 2023-06-25 MED ORDER — ACETAMINOPHEN 325 MG PO TABS
650.0000 mg | ORAL_TABLET | ORAL | Status: DC | PRN
  Administered 2023-06-25 – 2023-06-27 (×4): 650 mg via ORAL
  Filled 2023-06-25 (×4): qty 2

## 2023-06-25 MED ORDER — LABETALOL HCL 5 MG/ML IV SOLN: 80.0000 mg | INTRAVENOUS | Status: DC | PRN

## 2023-06-25 MED ORDER — LACTATED RINGERS IV SOLN: INTRAVENOUS | Status: DC

## 2023-06-25 MED ORDER — DIPHENHYDRAMINE HCL 25 MG PO CAPS
50.0000 mg | ORAL_CAPSULE | Freq: Four times a day (QID) | ORAL | Status: DC | PRN
  Administered 2023-06-25: 50 mg via ORAL
  Filled 2023-06-25 (×2): qty 2

## 2023-06-25 MED ORDER — DIPHENHYDRAMINE HCL 50 MG/ML IJ SOLN
12.5000 mg | Freq: Once | INTRAMUSCULAR | Status: AC
  Administered 2023-06-25: 12.5 mg via INTRAVENOUS
  Filled 2023-06-25: qty 1

## 2023-06-25 NOTE — Progress Notes (Signed)
   PRENATAL VISIT NOTE  Subjective:  Anne Richardson is a 37 y.o. [phone] at [redacted]w[redacted]d being seen today for ongoing prenatal care.  She is currently monitored for the following issues for this high-risk pregnancy and has Previous cesarean section; Schizoaffective disorder, bipolar type (HCC); PTSD (post-traumatic stress disorder); History of pulmonary embolism; AMA (advanced maternal age) multigravida 35+; BMI 50.0-59.9, adult (HCC); Supervision of high risk pregnancy in first trimester; History of gestational hypertension; Abnormal fetal ultrasound; 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; RED Chart Rounds pt; Anticoagulated; and Supraumbilical hernia on their problem list.  Patient reports headache.  Contractions: Irritability. Vag. Bleeding: None.  Movement: Present. Denies leaking of fluid.   The following portions of the patient's history were reviewed and updated as appropriate: allergies, current medications, past family history, past medical history, past social history, past surgical history and problem list.   Objective:   Vitals:   06/25/23 1136  BP: (!) 140/85  Pulse: (!) 121  Weight: (!) 316 lb 4.8 oz (143.5 kg)    Fetal Status: Fetal Heart Rate (bpm): 154   Movement: Present     General:  Alert, oriented and cooperative. Patient is in no acute distress.  Skin: Skin is warm and dry. No rash noted.   Cardiovascular: Normal heart rate noted  Respiratory: Normal respiratory effort, no problems with respiration noted  Abdomen: Soft, gravid, appropriate for gestational age.  Pain/Pressure: Present     Pelvic: Cervical exam deferred        Extremities: Normal range of motion.  Edema: Trace  Mental Status: Normal mood and affect. Normal behavior. Normal judgment and thought content.   Assessment and Plan:  Pregnancy: [id number] at [redacted]w[redacted]d 1. Supervision of high risk pregnancy in first trimester Hemorrhoid pain - hydrocortisone (ANUSOL-HC) 2.5 % rectal cream; Place rectally 2 (two)  times daily.  Dispense: 30 g; Refill: 2  2. Multigravida of advanced maternal age in third trimester Elevated BP and HA needs MAU evaluation  3. BMI 50.0-59.9, adult (HCC)   4. Previous cesarean section Repeat and BTL Accreta was r/o at [facility] Term labor symptoms and general obstetric precautions including but not limited to vaginal bleeding, contractions, leaking of fluid and fetal movement were reviewed in detail with the patient. Please refer to After Visit Summary for other counseling recommendations.   Return in about 1 week (around 07/02/2023).  Future Appointments  Date Time Provider Department Center  07/05/2023  9:30 AM [facility] PAT 1 [facility] None    Scheryl Darter, MD

## 2023-06-25 NOTE — Telephone Encounter (Signed)
Preadmission screen  

## 2023-06-25 NOTE — H&P (Cosign Needed Addendum)
OBSTETRIC ADMISSION HISTORY AND PHYSICAL  Anne Richardson is a 37 y.o. female [phone] with IUP at [redacted]w[redacted]d based on LMP presenting for concerns of her blood pressure. Patient was seen by her OBGYN and was found to have BP of 140/85, and was advised to present to MAU for further evaluation. Patient has a history of c-sections (x4), schizoaffective disorder, bipolar Tyle, PTSD, history of pulmonary embolism, AMA, BMI 50-59.9.    Patient also reports a headache that starts on her right temporal region and radiates to the back of her head associated with blurry vision in her right eye x1 week.    She is currently monitored for the following issues for this high-risk pregnancy and has Previous cesarean section (x4); Schizoaffective disorder, bipolar type (HCC); PTSD (post-traumatic stress disorder); History of pulmonary embolism; AMA (advanced maternal age) multigravida 35+; BMI 50.0-59.9, adult (HCC); Supervision of high risk pregnancy in first trimester; History of gestational hypertension.   She reports +FMs, No LOF, no VB, or peripheral edema, and RUQ pain. She request bilateral tubal ligation for birth control.  She received her [facility]   Dating: By LMP --->  Estimated Date of Delivery: 07/10/23  Sono:    @[redacted]w[redacted]d , CWD, normal anatomy, cephalic presentation 2746g, 33% EFW    Fetal growth is appropriate for gestational age.  Amniotic fluid  is normal good fetal activity seen.  Cephalic presentation.  Antenatal testing is reassuring.  BPP 8/8.  Placenta is anterior and there is no evidence of previa or  placenta accreta spectrum.  Prenatal History/Complications: None  Past Medical History: Past Medical History:  Diagnosis Date   Anticoagulated 06/04/2023   Anxiety    Chlamydia    Depression    doing good now   Gestational diabetes    Gonorrhea    History of gestational diabetes mellitus (GDM) 12/31/2022   Insufficient prenatal care in third trimester 07/20/2017   Onset  of care at 38w   Obesity in pregnancy 11/27/2015   Pregnancy induced hypertension    Pulmonary embolism (HCC)    UTI in pregnancy 12/31/2022   [ ]  ucx toc late jan    Past Surgical History: Past Surgical History:  Procedure Laterality Date   BIOPSY BREAST     BREAST LUMPECTOMY     left   CESAREAN SECTION     CESAREAN SECTION  06/28/2012   Procedure: CESAREAN SECTION;  Surgeon: Lesly Dukes, MD;  Location: [facility];  Service: Gynecology;  Laterality: N/A;   CESAREAN SECTION N/A 03/21/2016   Procedure: CESAREAN SECTION;  Surgeon: Lazaro Arms, MD;  Location: [facility];  Service: Obstetrics;  Laterality: N/A;   CESAREAN SECTION N/A 07/24/2017   Procedure: CESAREAN SECTION;  Surgeon: [name], MD;  Location: [facility] BIRTHING SUITES;  Service: Obstetrics;  Laterality: N/A;   WISDOM TOOTH EXTRACTION     age 56    Obstetrical History: OB History     Gravida  8   Para  4   Term  3   Preterm  1   AB  3   Living  5      SAB  3   IAB  0   Ectopic      Multiple  1   Live Births  5           Social History Social History   Socioeconomic History   Marital status: Single    Spouse name: Not on file   Number of children: Not  on file   Years of education: Not on file   Highest education level: Not on file  Occupational History   Not on file  Tobacco Use   Smoking status: Every Day    Types: Cigars   Smokeless tobacco: Never  Vaping Use   Vaping Use: Never used  Substance and Sexual Activity   Alcohol use: No   Drug use: Not Currently    Types: Marijuana    Comment: last use jan 2024   Sexual activity: Not Currently    Birth control/protection: None  Other Topics Concern   Not on file  Social History Narrative   Single, Lives with infant child, brother, and boyfriend (father of children)    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   Social Determinants of Health   Financial Resource Strain: Not on file  Food Insecurity: No Food  Insecurity (12/31/2022)   Hunger Vital Sign    Worried About Running Out of Food in the Last Year: Never true    Ran Out of Food in the Last Year: Never true  Transportation Needs: No Transportation Needs (12/31/2022)   PRAPARE - Administrator, Civil Service (Medical): No    Lack of Transportation (Non-Medical): No  Physical Activity: Not on file  Stress: Not on file  Social Connections: Not on file    Family History: Family History  Problem Relation Age of Onset   Cancer Mother 8       breast   Alcohol abuse Mother    Depression Mother    Hypertension Mother    Depression Sister    Hypertension Sister    Asthma Brother    Depression Brother    Hypertension Brother    Alcohol abuse Maternal Aunt    Hypertension Maternal Aunt    Cancer Maternal Aunt 30       breast ca   Alcohol abuse Maternal Uncle    Hypertension Maternal Uncle    Heart disease Maternal Grandmother    Alcohol abuse Maternal Grandmother    Hypertension Maternal Grandmother    Heart disease Maternal Grandfather    Alcohol abuse Maternal Grandfather    Hypertension Maternal Grandfather    Cancer Maternal Grandfather        prostate and bone   Depression Paternal Grandfather    Alzheimer's disease Paternal Grandmother    Diabetes Paternal Grandmother     Allergies: Allergies  Allergen Reactions   Penicillins Itching and Swelling    Patient tolerates: amoxicillin, cefazolin, and cephalexin  Has patient had a PCN reaction causing immediate rash, facial/tongue/throat swelling, SOB or lightheadedness with hypotension: No Has patient had a PCN reaction causing severe rash involving mucus membranes or skin necrosis: No Has patient had a PCN reaction that required hospitalization No Has patient had a PCN reaction occurring within the last 10 years: No If all of the above answers are "NO", then may proceed with Cephalosporin use.    Pork-Derived Products Other (See Comments)    Religious  reasons     Medications Prior to Admission  Medication Sig Dispense Refill Last Dose   acetaminophen (TYLENOL) 500 MG tablet Take 2 tablets (1,000 mg total) by mouth every 6 (six) hours as needed for mild pain or moderate pain. 30 tablet 0    albuterol (VENTOLIN HFA) 108 (90 Base) MCG/ACT inhaler Inhale 2 puffs into the lungs every 6 (six) hours as needed for wheezing or shortness of breath. (Patient not taking: Reported on  06/14/2023) 8.5 g 3    cetirizine (ZYRTEC ALLERGY) 10 MG tablet Take 1 tablet (10 mg total) by mouth daily. 30 tablet 11    enoxaparin (LOVENOX) 60 MG/0.6ML injection Inject 0.6 mLs (60 mg total) into the skin daily. 18 mL 6    fluticasone (FLONASE) 50 MCG/ACT nasal spray Place 1 spray into both nostrils daily. 16 g 2    hydrocortisone (ANUSOL-HC) 2.5 % rectal cream Place rectally 2 (two) times daily. 30 g 2    metoCLOPramide (REGLAN) 10 MG tablet Take 1 tablet (10 mg total) by mouth every 6 (six) hours as needed for nausea (or headache). 30 tablet 0    polyethylene glycol (MIRALAX / GLYCOLAX) 17 g packet Take 17 g by mouth daily. (Patient not taking: Reported on 06/14/2023) 28 each 0    prenatal vitamin w/FE, FA (PRENATAL 1 + 1) 27-1 MG TABS tablet Take 1 tablet by mouth daily at 12 noon. 30 tablet 11    promethazine (PHENERGAN) 25 MG tablet Take 1 tablet (25 mg total) by mouth every 6 (six) hours as needed for nausea. 30 tablet 0    sertraline (ZOLOFT) 50 MG tablet Take 1 tablet (50 mg total) by mouth daily. (Patient not taking: Reported on 12/31/2022) 30 tablet 3      Review of Systems   All systems reviewed and negative except as stated in HPI  Blood pressure 124/78, pulse (!) 114, temperature 98.1 F (36.7 C), temperature source Oral, resp. rate 20, weight (!) 145.2 kg, last menstrual period 10/03/2022, SpO2 97 %, unknown if currently breastfeeding. General appearance: alert and cooperative Lungs: clear to auscultation bilaterally Heart: regular rate and  rhythm Abdomen: soft, non-tender; bowel sounds normal Extremities: Homans sign is negative, no sign of DVT Fetal monitoringBaseline: 135 bpm, Variability: Good {> 6 bpm), Accelerations: Reactive, and Decelerations: Absent Uterine activityNone     Prenatal labs: ABO, Rh: B/Positive/-- (01/11 1608) Antibody: Negative (01/11 1608) Rubella: 3.82 (01/11 1608) RPR: Non Reactive (05/01 0949)  HBsAg: Negative (01/11 1608)  HIV: Non Reactive (05/01 0949)  GBS: Negative/-- (06/25 0903)  Genetic screening  n/a Anatomy US n/a  Prenatal Transfer Tool  Maternal Diabetes: n/a Genetic Screening: n/a Maternal Ultrasounds/Referrals: Initially concerned for placental accreta (see [place] on 12/31/22 and 01/13/23); subsequent [place] (on 06/16/23) showed no evidence of previa or accreta.  Fetal Ultrasounds or other Referrals:  Referred to Materal Fetal Medicine  Maternal Substance Abuse:  No Significant Maternal Medications:  None Significant Maternal Lab Results:  None Number of Prenatal Visits:greater than 3 verified prenatal visits Other Comments:  None  Results for orders placed or performed during the hospital encounter of 06/25/23 (from the past 24 hour(s))  Protein / creatinine ratio, urine   Collection Time: 06/25/23  2:44 PM  Result Value Ref Range   Creatinine, Urine 211 mg/dL   Total Protein, Urine 127 mg/dL   Protein Creatinine Ratio 0.60 (H) 0.00 - 0.15 mg/mg[Cre]  CBC   Collection Time: 06/25/23  3:00 PM  Result Value Ref Range   WBC 12.4 (H) 4.0 - 10.5 K/uL   RBC 4.04 3.87 - 5.11 MIL/uL   Hemoglobin 12.2 12.0 - 15.0 g/dL   HCT 57.8 46.9 - 62.9 %   MCV 93.3 80.0 - 100.0 fL   MCH 30.2 26.0 - 34.0 pg   MCHC 32.4 30.0 - 36.0 g/dL   RDW 52.8 41.3 - 24.4 %   Platelets 348 150 - 400 K/uL   nRBC 0.0 0.0 - 0.2 %  Comprehensive metabolic panel   Collection Time: 06/25/23  3:00 PM  Result Value Ref Range   Sodium 134 (L) 135 - 145 mmol/L   Potassium 4.0 3.5 - 5.1 mmol/L   Chloride 103 98 -  111 mmol/L   CO2 22 22 - 32 mmol/L   Glucose, Bld 120 (H) 70 - 99 mg/dL   BUN 8 6 - 20 mg/dL   Creatinine, Ser 1.30 0.44 - 1.00 mg/dL   Calcium 9.8 8.9 - 86.5 mg/dL   Total Protein 6.3 (L) 6.5 - 8.1 g/dL   Albumin 2.5 (L) 3.5 - 5.0 g/dL   AST 20 15 - 41 U/L   ALT 14 0 - 44 U/L   Alkaline Phosphatase 106 38 - 126 U/L   Total Bilirubin 0.4 0.3 - 1.2 mg/dL   GFR, Estimated >78 >46 mL/min   Anion gap 9 5 - 15    Patient Active Problem List   Diagnosis Date Noted   Supraumbilical hernia 06/14/2023   Anticoagulated 06/04/2023   RED Chart Rounds pt 02/20/2023   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 01/09/2023   History of gestational hypertension 12/31/2022   Abnormal fetal ultrasound 12/31/2022   Supervision of high risk pregnancy in first trimester 11/30/2022   History of pulmonary embolism 02/12/2022   AMA (advanced maternal age) multigravida 35+ 02/12/2022   BMI 50.0-59.9, adult (HCC) 02/12/2022   Schizoaffective disorder, bipolar type (HCC) 05/20/2018   PTSD (post-traumatic stress disorder) 05/20/2018   Previous cesarean section 03/21/2016    Assessment/Plan:  Anne Richardson is a 37 y.o. [id number] at [redacted]w[redacted]d here for concerns of her blood pressure. Patient was seen by her OBGYN and was found to have BP of 140/85, and was advised to present to MAU for further evaluation. PC ratio was found to be 0.60. Discussed with inpatient team to schedule patient for c-section. Inpatient team agreed.   #Labor: C-section with BTL  #Pain: Duramorph #FWB: Baseline 140bmp, moderate variability, no decels; Cat I Tracing #MOC: BTL (papers signed 5/1)      Laurie Panda, MD  PGY-3 Visiting Resident 06/25/2023, 4:47 PM   CNM attestation:  I have seen and examined this patient and agree with above documentation in the resident's note.   Norhan Tanzillo Blackley is a 37 y.o. [id number] at [redacted]w[redacted]d sent from the office for BP workup. Patient noted to have elevated BP's of 140s/90s. She is reporting a  constant headache for the past few days that radiates into her right temple and the back side of her head. She is also reporting blurry vision in her right eye. She has taken Tylenol and Reglan as prescribed which does not help. She denies RUQ/epigastric pain. No contractions, vaginal bleeding or leaking fluid. She reports normal fetal movement.   PE: Patient Vitals for the past 24 hrs:  BP Temp Temp src Pulse Resp SpO2 Weight  06/25/23 1645 125/72 -- -- (!) 113 -- 96 % --  06/25/23 1630 124/78 -- -- (!) 114 -- 97 % --  06/25/23 1615 (!) 124/90 -- -- (!) 106 -- 97 % --  06/25/23 1600 (!) 104/55 -- -- (!) 110 -- 95 % --  06/25/23 1530 (!) 117/59 -- -- (!) 113 -- 98 % (!) 145.2 kg  06/25/23 1515 (!) 105/48 -- -- (!) 113 -- 97 % --  06/25/23 1505 (!) 100/59 -- -- (!) 116 -- 98 % --  06/25/23 1453 (!) 109/58 98.1 F (36.7 C) Oral (!) 117 20 96 % --  Gen: calm, comfortable, no acute distress HEENT: PERRL  Resp: normal effort, no distress Heart: regular rate Abd: soft, non-tender, gravid Neuro: alert and oriented x4, no cranial nerve deficits, neg clonus   FHR: 145 bpm, moderate variability, +15x15 accels, no decels Toco: quiet  ROS, labs, PMH reviewed  Orders Placed This Encounter  Procedures   CBC   Comprehensive metabolic panel   Protein / creatinine ratio, urine   RPR   HIV Antibody (routine testing w rflx)   Place and maintain sequential compression device   Type and screen [facility]   Insert peripheral IV   Meds ordered this encounter  Medications   metoCLOPramide (REGLAN) injection 10 mg   diphenhydrAMINE (BENADRYL) injection 12.5 mg   lactated ringers infusion   sodium citrate-citric acid (ORACIT) solution 30 mL   AND Linked Order Group    clindamycin (CLEOCIN) IVPB 900 mg     Order Specific Question:   Indication:     Answer:   Surgical Prophylaxis    gentamicin (GARAMYCIN) 470 mg in dextrose 5 % 100 mL IVPB     Order Specific Question:    Indication:     Answer:   Surgical Prophylaxis   Results for orders placed or performed during the hospital encounter of 06/25/23 (from the past 24 hour(s))  Protein / creatinine ratio, urine     Status: Abnormal   Collection Time: 06/25/23  2:44 PM  Result Value Ref Range   Creatinine, Urine 211 mg/dL   Total Protein, Urine 127 mg/dL   Protein Creatinine Ratio 0.60 (H) 0.00 - 0.15 mg/mg[Cre]  CBC     Status: Abnormal   Collection Time: 06/25/23  3:00 PM  Result Value Ref Range   WBC 12.4 (H) 4.0 - 10.5 K/uL   RBC 4.04 3.87 - 5.11 MIL/uL   Hemoglobin 12.2 12.0 - 15.0 g/dL   HCT 29.5 62.1 - 30.8 %   MCV 93.3 80.0 - 100.0 fL   MCH 30.2 26.0 - 34.0 pg   MCHC 32.4 30.0 - 36.0 g/dL   RDW 65.7 84.6 - 96.2 %   Platelets 348 150 - 400 K/uL   nRBC 0.0 0.0 - 0.2 %  Comprehensive metabolic panel     Status: Abnormal   Collection Time: 06/25/23  3:00 PM  Result Value Ref Range   Sodium 134 (L) 135 - 145 mmol/L   Potassium 4.0 3.5 - 5.1 mmol/L   Chloride 103 98 - 111 mmol/L   CO2 22 22 - 32 mmol/L   Glucose, Bld 120 (H) 70 - 99 mg/dL   BUN 8 6 - 20 mg/dL   Creatinine, Ser 9.52 0.44 - 1.00 mg/dL   Calcium 9.8 8.9 - 84.1 mg/dL   Total Protein 6.3 (L) 6.5 - 8.1 g/dL   Albumin 2.5 (L) 3.5 - 5.0 g/dL   AST 20 15 - 41 U/L   ALT 14 0 - 44 U/L   Alkaline Phosphatase 106 38 - 126 U/L   Total Bilirubin 0.4 0.3 - 1.2 mg/dL   GFR, Estimated >32 >44 mL/min   Anion gap 9 5 - 15    MDM CBC, CMP, UPCR NST Reglan and Benadryl   Reviewed BP's from office today as well as previous office visits. Patient noted to have several elevated BP's from prior visits. Given previous elevated BPs from prior visit plus elevated BP in office today, patient meets criteria for gHTN. UPCR resulted and found to be elevated at 0.6. Other labs  reassuring. Dr. Alvester Morin notified of patient findings and labs.   Assessment: 1. RED Chart Rounds pt   2. [redacted] weeks gestation of pregnancy   3. Severe pre-eclampsia, third  trimester     Plan: - Admit to [facility] unit per consult with Federico Flake, * with plans for delivery tomorrow morning when 2 surgeons will be available - Magnesium sulfate ordered - NPO  Brand Males, CNM 06/25/2023 5:27 PM

## 2023-06-25 NOTE — MAU Note (Signed)
.  Anne Richardson is a 37 y.o. at [redacted]w[redacted]d here in MAU reporting: sent in for elevated BP in the office, patient also has a headache and blurred vision. Was on lovenox for hx of PE, forgot to take her dose last night. Denies VB or LOF. +FM. NPO since 1330. Endorses an increase in pelvic pressure today.   Pain score: headache- 10 pelvic pressure- 8 Vitals:   06/25/23 1453  BP: (!) 109/58  Pulse: (!) 117  Resp: 20  Temp: 98.1 F (36.7 C)  SpO2: 96%     FHT:138

## 2023-06-26 ENCOUNTER — Encounter (HOSPITAL_COMMUNITY)
Admission: AD | Disposition: A | Discharge status: Home / Self Care | Payer: Self-pay | Source: Home / Self Care | Attending: Obstetrics and Gynecology

## 2023-06-26 ENCOUNTER — Inpatient Hospital Stay (HOSPITAL_COMMUNITY): Payer: MEDICAID | Admitting: Anesthesiology

## 2023-06-26 ENCOUNTER — Other Ambulatory Visit: Payer: Self-pay

## 2023-06-26 ENCOUNTER — Encounter (HOSPITAL_COMMUNITY): Payer: Self-pay | Admitting: Obstetrics and Gynecology

## 2023-06-26 DIAGNOSIS — Z3A37 37 weeks gestation of pregnancy: Secondary | ICD-10-CM | POA: Diagnosis not present

## 2023-06-26 DIAGNOSIS — Z880 Allergy status to penicillin: Secondary | ICD-10-CM | POA: Diagnosis not present

## 2023-06-26 DIAGNOSIS — O99344 Other mental disorders complicating childbirth: Secondary | ICD-10-CM | POA: Diagnosis not present

## 2023-06-26 DIAGNOSIS — Z86711 Personal history of pulmonary embolism: Secondary | ICD-10-CM | POA: Diagnosis not present

## 2023-06-26 DIAGNOSIS — Z3A38 38 weeks gestation of pregnancy: Secondary | ICD-10-CM

## 2023-06-26 DIAGNOSIS — O09523 Supervision of elderly multigravida, third trimester: Secondary | ICD-10-CM | POA: Diagnosis not present

## 2023-06-26 DIAGNOSIS — O99334 Smoking (tobacco) complicating childbirth: Secondary | ICD-10-CM | POA: Diagnosis present

## 2023-06-26 DIAGNOSIS — Z8759 Personal history of other complications of pregnancy, childbirth and the puerperium: Secondary | ICD-10-CM

## 2023-06-26 DIAGNOSIS — O1414 Severe pre-eclampsia complicating childbirth: Secondary | ICD-10-CM | POA: Diagnosis not present

## 2023-06-26 DIAGNOSIS — F17290 Nicotine dependence, other tobacco product, uncomplicated: Secondary | ICD-10-CM | POA: Diagnosis present

## 2023-06-26 DIAGNOSIS — O99214 Obesity complicating childbirth: Secondary | ICD-10-CM | POA: Diagnosis present

## 2023-06-26 DIAGNOSIS — O1490 Unspecified pre-eclampsia, unspecified trimester: Secondary | ICD-10-CM | POA: Diagnosis present

## 2023-06-26 DIAGNOSIS — O34211 Maternal care for low transverse scar from previous cesarean delivery: Secondary | ICD-10-CM

## 2023-06-26 DIAGNOSIS — O1410 Severe pre-eclampsia, unspecified trimester: Secondary | ICD-10-CM

## 2023-06-26 LAB — CBC
HCT: 35.6 % — ABNORMAL LOW (ref 36.0–46.0)
HCT: 37.5 % (ref 36.0–46.0)
Hemoglobin: 11.6 g/dL — ABNORMAL LOW (ref 12.0–15.0)
Hemoglobin: 12.3 g/dL (ref 12.0–15.0)
MCH: 29.6 pg (ref 26.0–34.0)
MCH: 29.5 pg (ref 26.0–34.0)
MCHC: 32.6 g/dL (ref 30.0–36.0)
MCHC: 32.8 g/dL (ref 30.0–36.0)
MCV: 90.8 fL (ref 80.0–100.0)
MCV: 89.9 fL (ref 80.0–100.0)
Platelets: 330 10*3/uL (ref 150–400)
Platelets: 333 10*3/uL (ref 150–400)
RBC: 3.92 MIL/uL (ref 3.87–5.11)
RBC: 4.17 MIL/uL (ref 3.87–5.11)
RDW: 15.2 % (ref 11.5–15.5)
RDW: 15 % (ref 11.5–15.5)
WBC: 12 10*3/uL — ABNORMAL HIGH (ref 4.0–10.5)
WBC: 20.9 10*3/uL — ABNORMAL HIGH (ref 4.0–10.5)
nRBC: 0 % (ref 0.0–0.2)
nRBC: 0 % (ref 0.0–0.2)

## 2023-06-26 LAB — RPR: RPR Ser Ql: NONREACTIVE

## 2023-06-26 LAB — BPAM RBC
Blood Product Expiration Date: 202407192359
Unit Type and Rh: 7300

## 2023-06-26 LAB — TYPE AND SCREEN
ABO/RH(D): B POS
Antibody Screen: NEGATIVE

## 2023-06-26 LAB — CREATININE, SERUM
Creatinine, Ser: 0.52 mg/dL (ref 0.44–1.00)
GFR, Estimated: >60 mL/min (ref >60)

## 2023-06-26 LAB — GLUCOSE, CAPILLARY: Glucose-Capillary: 101 mg/dL — ABNORMAL HIGH (ref 70–99)

## 2023-06-26 SURGERY — Surgical Case
Anesthesia: Spinal | Site: Abdomen

## 2023-06-26 MED ORDER — SCOPOLAMINE 1 MG/3DAYS TD PT72
1.0000 | MEDICATED_PATCH | Freq: Once | TRANSDERMAL | Status: DC
  Administered 2023-06-27: 1.5 mg via TRANSDERMAL
  Filled 2023-06-26: qty 1

## 2023-06-26 MED ORDER — KETOROLAC TROMETHAMINE 30 MG/ML IJ SOLN: 30.0000 mg | Freq: Once | INTRAMUSCULAR | Status: DC | PRN

## 2023-06-26 MED ORDER — LACTATED RINGERS IV SOLN: INTRAVENOUS | Status: DC | PRN

## 2023-06-26 MED ORDER — SCOPOLAMINE 1 MG/3DAYS TD PT72
MEDICATED_PATCH | TRANSDERMAL | Status: AC
  Filled 2023-06-26: qty 1

## 2023-06-26 MED ORDER — SIMETHICONE 80 MG PO CHEW
80.0000 mg | CHEWABLE_TABLET | Freq: Three times a day (TID) | ORAL | Status: DC
  Administered 2023-06-26 – 2023-06-28 (×5): 80 mg via ORAL
  Filled 2023-06-26 (×5): qty 1

## 2023-06-26 MED ORDER — DEXAMETHASONE SODIUM PHOSPHATE 10 MG/ML IJ SOLN
INTRAMUSCULAR | Status: DC | PRN
  Administered 2023-06-26: 10 mg via INTRAVENOUS

## 2023-06-26 MED ORDER — KETOROLAC TROMETHAMINE 30 MG/ML IJ SOLN
30.0000 mg | Freq: Four times a day (QID) | INTRAMUSCULAR | Status: DC
  Filled 2023-06-26 (×2): qty 1

## 2023-06-26 MED ORDER — BUPIVACAINE IN DEXTROSE 0.75-8.25 % IT SOLN
INTRATHECAL | Status: DC | PRN
  Administered 2023-06-26: 12 mg via INTRATHECAL

## 2023-06-26 MED ORDER — STERILE WATER FOR IRRIGATION IR SOLN
Status: DC | PRN
  Administered 2023-06-26: 1000 mL

## 2023-06-26 MED ORDER — ONDANSETRON HCL 4 MG/2ML IJ SOLN
INTRAMUSCULAR | Status: DC | PRN
  Administered 2023-06-26: 4 mg via INTRAVENOUS

## 2023-06-26 MED ORDER — ONDANSETRON HCL 4 MG/2ML IJ SOLN
INTRAMUSCULAR | Status: AC
  Filled 2023-06-26: qty 2

## 2023-06-26 MED ORDER — ZOLPIDEM TARTRATE 5 MG PO TABS: 5.0000 mg | ORAL_TABLET | Freq: Every evening | ORAL | Status: DC | PRN

## 2023-06-26 MED ORDER — ACETAMINOPHEN 10 MG/ML IV SOLN
INTRAVENOUS | Status: DC | PRN
  Administered 2023-06-26: 1000 mg via INTRAVENOUS

## 2023-06-26 MED ORDER — SIMETHICONE 80 MG PO CHEW: 80.0000 mg | CHEWABLE_TABLET | ORAL | Status: DC | PRN

## 2023-06-26 MED ORDER — HYDROMORPHONE HCL 1 MG/ML IJ SOLN
INTRAMUSCULAR | Status: AC
  Filled 2023-06-26: qty 0.5

## 2023-06-26 MED ORDER — PHENYLEPHRINE 80 MCG/ML (10ML) SYRINGE FOR IV PUSH (FOR BLOOD PRESSURE SUPPORT)
PREFILLED_SYRINGE | INTRAVENOUS | Status: AC
  Filled 2023-06-26: qty 10

## 2023-06-26 MED ORDER — TRANEXAMIC ACID-NACL 1000-0.7 MG/100ML-% IV SOLN
INTRAVENOUS | Status: AC
  Filled 2023-06-26: qty 100

## 2023-06-26 MED ORDER — PHENYLEPHRINE HCL-NACL 20-0.9 MG/250ML-% IV SOLN
INTRAVENOUS | Status: AC
  Filled 2023-06-26: qty 250

## 2023-06-26 MED ORDER — CEFAZOLIN IN SODIUM CHLORIDE 3-0.9 GM/100ML-% IV SOLN
3.0000 g | INTRAVENOUS | Status: AC
  Administered 2023-06-26: 3 g via INTRAVENOUS
  Filled 2023-06-26: qty 100

## 2023-06-26 MED ORDER — MORPHINE SULFATE (PF) 0.5 MG/ML IJ SOLN
INTRAMUSCULAR | Status: AC
  Filled 2023-06-26: qty 10

## 2023-06-26 MED ORDER — SODIUM CHLORIDE 0.9% FLUSH: 3.0000 mL | INTRAVENOUS | Status: DC | PRN

## 2023-06-26 MED ORDER — FENTANYL CITRATE (PF) 100 MCG/2ML IJ SOLN
INTRAMUSCULAR | Status: DC | PRN
  Administered 2023-06-26: 15 ug via INTRATHECAL

## 2023-06-26 MED ORDER — OXYTOCIN-SODIUM CHLORIDE 30-0.9 UT/500ML-% IV SOLN
INTRAVENOUS | Status: DC | PRN
  Administered 2023-06-26: 300 mL via INTRAVENOUS

## 2023-06-26 MED ORDER — CEFAZOLIN IN SODIUM CHLORIDE 3-0.9 GM/100ML-% IV SOLN
INTRAVENOUS | Status: AC
  Filled 2023-06-26: qty 100

## 2023-06-26 MED ORDER — FENTANYL CITRATE (PF) 100 MCG/2ML IJ SOLN
INTRAMUSCULAR | Status: DC | PRN
  Administered 2023-06-26: 85 ug via INTRAVENOUS

## 2023-06-26 MED ORDER — OXYTOCIN-SODIUM CHLORIDE 30-0.9 UT/500ML-% IV SOLN: 2.5000 [IU]/h | INTRAVENOUS | Status: AC

## 2023-06-26 MED ORDER — OXYTOCIN-SODIUM CHLORIDE 30-0.9 UT/500ML-% IV SOLN
INTRAVENOUS | Status: AC
  Filled 2023-06-26: qty 500

## 2023-06-26 MED ORDER — NALOXONE HCL 0.4 MG/ML IJ SOLN: 0.4000 mg | INTRAMUSCULAR | Status: DC | PRN

## 2023-06-26 MED ORDER — OXYCODONE HCL 5 MG PO TABS
5.0000 mg | ORAL_TABLET | ORAL | Status: DC | PRN
  Administered 2023-06-27 – 2023-06-28 (×2): 5 mg via ORAL
  Filled 2023-06-26 (×2): qty 1

## 2023-06-26 MED ORDER — SENNOSIDES-DOCUSATE SODIUM 8.6-50 MG PO TABS
2.0000 | ORAL_TABLET | Freq: Every day | ORAL | Status: DC
  Administered 2023-06-27 – 2023-06-28 (×2): 2 via ORAL
  Filled 2023-06-26 (×2): qty 2

## 2023-06-26 MED ORDER — ACETAMINOPHEN 10 MG/ML IV SOLN
INTRAVENOUS | Status: AC
  Filled 2023-06-26: qty 100

## 2023-06-26 MED ORDER — MEPERIDINE HCL 25 MG/ML IJ SOLN: 6.2500 mg | INTRAMUSCULAR | Status: DC | PRN

## 2023-06-26 MED ORDER — PHENYLEPHRINE HCL (PRESSORS) 10 MG/ML IV SOLN
INTRAVENOUS | Status: DC | PRN
  Administered 2023-06-26 (×3): 80 ug via INTRAVENOUS

## 2023-06-26 MED ORDER — DIBUCAINE (PERIANAL) 1 % EX OINT: 1.0000 | TOPICAL_OINTMENT | CUTANEOUS | Status: DC | PRN

## 2023-06-26 MED ORDER — OXYCODONE HCL 5 MG PO TABS: 5.0000 mg | ORAL_TABLET | Freq: Once | ORAL | Status: DC | PRN

## 2023-06-26 MED ORDER — MORPHINE SULFATE (PF) 0.5 MG/ML IJ SOLN
INTRAMUSCULAR | Status: DC | PRN
  Administered 2023-06-26: 150 ug via INTRATHECAL

## 2023-06-26 MED ORDER — DEXAMETHASONE SODIUM PHOSPHATE 4 MG/ML IJ SOLN
INTRAMUSCULAR | Status: AC
  Filled 2023-06-26: qty 2

## 2023-06-26 MED ORDER — METOCLOPRAMIDE HCL 5 MG/ML IJ SOLN
INTRAMUSCULAR | Status: AC
  Filled 2023-06-26: qty 2

## 2023-06-26 MED ORDER — COCONUT OIL OIL: 1.0000 | TOPICAL_OIL | Status: DC | PRN

## 2023-06-26 MED ORDER — MENTHOL 3 MG MT LOZG: 1.0000 | LOZENGE | OROMUCOSAL | Status: DC | PRN

## 2023-06-26 MED ORDER — DIPHENHYDRAMINE HCL 25 MG PO CAPS
25.0000 mg | ORAL_CAPSULE | ORAL | Status: DC | PRN
  Filled 2023-06-26: qty 1

## 2023-06-26 MED ORDER — FENTANYL CITRATE (PF) 100 MCG/2ML IJ SOLN
INTRAMUSCULAR | Status: AC
  Filled 2023-06-26: qty 2

## 2023-06-26 MED ORDER — METOCLOPRAMIDE HCL 5 MG/ML IJ SOLN
INTRAMUSCULAR | Status: DC | PRN
  Administered 2023-06-26: 10 mg via INTRAVENOUS

## 2023-06-26 MED ORDER — ONDANSETRON HCL 4 MG/2ML IJ SOLN: 4.0000 mg | Freq: Three times a day (TID) | INTRAMUSCULAR | Status: DC | PRN

## 2023-06-26 MED ORDER — DIPHENHYDRAMINE HCL 50 MG/ML IJ SOLN
12.5000 mg | INTRAMUSCULAR | Status: DC | PRN
  Administered 2023-06-27: 12.5 mg via INTRAVENOUS
  Filled 2023-06-26: qty 1

## 2023-06-26 MED ORDER — GABAPENTIN 100 MG PO CAPS
100.0000 mg | ORAL_CAPSULE | Freq: Three times a day (TID) | ORAL | Status: DC
  Administered 2023-06-26 – 2023-06-28 (×5): 100 mg via ORAL
  Filled 2023-06-26 (×5): qty 1

## 2023-06-26 MED ORDER — TRANEXAMIC ACID-NACL 1000-0.7 MG/100ML-% IV SOLN
INTRAVENOUS | Status: DC | PRN
  Administered 2023-06-26: 1000 mg via INTRAVENOUS

## 2023-06-26 MED ORDER — PRENATAL MULTIVITAMIN CH
1.0000 | ORAL_TABLET | Freq: Every day | ORAL | Status: DC
  Administered 2023-06-27 – 2023-06-28 (×2): 1 via ORAL
  Filled 2023-06-26 (×2): qty 1

## 2023-06-26 MED ORDER — PHENYLEPHRINE HCL-NACL 20-0.9 MG/250ML-% IV SOLN
INTRAVENOUS | Status: DC | PRN
  Administered 2023-06-26: 60 ug/min via INTRAVENOUS

## 2023-06-26 MED ORDER — DEXMEDETOMIDINE HCL IN NACL 80 MCG/20ML IV SOLN
INTRAVENOUS | Status: DC | PRN
  Administered 2023-06-26: 8 ug via INTRAVENOUS

## 2023-06-26 MED ORDER — LIDOCAINE-EPINEPHRINE (PF) 2 %-1:200000 IJ SOLN
INTRAMUSCULAR | Status: DC | PRN
  Administered 2023-06-26: 5 mL via EPIDURAL
  Administered 2023-06-26: 3 mL via EPIDURAL
  Administered 2023-06-26: 2 mL via EPIDURAL

## 2023-06-26 MED ORDER — DIPHENHYDRAMINE HCL 25 MG PO CAPS
25.0000 mg | ORAL_CAPSULE | Freq: Four times a day (QID) | ORAL | Status: DC | PRN
  Administered 2023-06-27 (×2): 25 mg via ORAL
  Filled 2023-06-26: qty 1

## 2023-06-26 MED ORDER — NALOXONE HCL 4 MG/10ML IJ SOLN: 1.0000 ug/kg/h | INTRAVENOUS | Status: DC | PRN

## 2023-06-26 MED ORDER — SCOPOLAMINE 1 MG/3DAYS TD PT72
MEDICATED_PATCH | TRANSDERMAL | Status: DC | PRN
  Administered 2023-06-26: 1 via TRANSDERMAL

## 2023-06-26 MED ORDER — ONDANSETRON HCL 4 MG/2ML IJ SOLN
4.0000 mg | Freq: Once | INTRAMUSCULAR | Status: AC | PRN
  Administered 2023-06-26: 4 mg via INTRAVENOUS

## 2023-06-26 MED ORDER — OXYCODONE HCL 5 MG/5ML PO SOLN: 5.0000 mg | Freq: Once | ORAL | Status: DC | PRN

## 2023-06-26 MED ORDER — KETOROLAC TROMETHAMINE 30 MG/ML IJ SOLN
30.0000 mg | Freq: Four times a day (QID) | INTRAMUSCULAR | Status: DC
  Administered 2023-06-26 – 2023-06-28 (×7): 30 mg via INTRAVENOUS
  Filled 2023-06-26 (×6): qty 1

## 2023-06-26 MED ORDER — ENOXAPARIN SODIUM 80 MG/0.8ML IJ SOSY
70.0000 mg | PREFILLED_SYRINGE | INTRAMUSCULAR | Status: DC
  Administered 2023-06-27 – 2023-06-28 (×2): 70 mg via SUBCUTANEOUS
  Filled 2023-06-26 (×2): qty 0.8

## 2023-06-26 MED ORDER — WITCH HAZEL-GLYCERIN EX PADS: 1.0000 | MEDICATED_PAD | CUTANEOUS | Status: DC | PRN

## 2023-06-26 MED ORDER — HYDROMORPHONE HCL 1 MG/ML IJ SOLN
0.2500 mg | INTRAMUSCULAR | Status: DC | PRN
  Administered 2023-06-26: 0.5 mg via INTRAVENOUS

## 2023-06-26 MED ORDER — SOD CITRATE-CITRIC ACID 500-334 MG/5ML PO SOLN: 30.0000 mL | ORAL | Status: DC

## 2023-06-26 SURGICAL SUPPLY — 41 items
ADH SKN CLS APL DERMABOND .7 (GAUZE/BANDAGES/DRESSINGS) ×4
CHLORAPREP W/TINT 26ML (MISCELLANEOUS) ×6 IMPLANT
CLAMP UMBILICAL CORD (MISCELLANEOUS) ×3 IMPLANT
CLOTH BEACON ORANGE TIMEOUT ST (SAFETY) ×3 IMPLANT
DERMABOND ADVANCED .7 DNX12 (GAUZE/BANDAGES/DRESSINGS) ×6 IMPLANT
DRSG OPSITE POSTOP 4X10 (GAUZE/BANDAGES/DRESSINGS) ×3 IMPLANT
ELECT REM PT RETURN 9FT ADLT (ELECTROSURGICAL) ×2
ELECTRODE REM PT RTRN 9FT ADLT (ELECTROSURGICAL) ×3 IMPLANT
EXCISOR BIOPSY CONE FISHER (MISCELLANEOUS) ×1 IMPLANT
EXTENDER TRAXI PANNICULUS (MISCELLANEOUS) ×1 IMPLANT
EXTRACTOR VACUUM KIWI (MISCELLANEOUS) IMPLANT
GAUZE SPONGE 4X4 12PLY STRL LF (GAUZE/BANDAGES/DRESSINGS) ×2 IMPLANT
GLOVE BIOGEL PI IND STRL 7.0 (GLOVE) ×3 IMPLANT
GLOVE ECLIPSE 7.5 STRL STRAW (GLOVE) ×3 IMPLANT
GOWN STRL REUS W/TWL LRG LVL3 (GOWN DISPOSABLE) ×9 IMPLANT
KIT ABG SYR 3ML LUER SLIP (SYRINGE) IMPLANT
MAT PREVALON FULL STRYKER (MISCELLANEOUS) ×1 IMPLANT
NDL HYPO 25X5/8 SAFETYGLIDE (NEEDLE) IMPLANT
NEEDLE HYPO 25X5/8 SAFETYGLIDE (NEEDLE) IMPLANT
NS IRRIG 1000ML POUR BTL (IV SOLUTION) ×3 IMPLANT
PACK C SECTION WH (CUSTOM PROCEDURE TRAY) ×3 IMPLANT
PAD ABD DERMACEA PRESS 5X9 (GAUZE/BANDAGES/DRESSINGS) ×1 IMPLANT
PAD OB MATERNITY 4.3X12.25 (PERSONAL CARE ITEMS) ×3 IMPLANT
PENCIL SMOKE EVAC W/HOLSTER (ELECTROSURGICAL) ×3 IMPLANT
RETRACTOR TRAXI PANNICULUS (MISCELLANEOUS) ×1 IMPLANT
RTRCTR C-SECT PINK 25CM LRG (MISCELLANEOUS) ×3 IMPLANT
SUT MNCRL 0 VIOLET CTX 36 (SUTURE) ×6 IMPLANT
SUT MNCRL AB 3-0 PS2 27 (SUTURE) ×3 IMPLANT
SUT MON AB 4-0 PS1 27 (SUTURE) ×3 IMPLANT
SUT PLAIN 2 0 (SUTURE) ×2
SUT PLAIN 2 0 XLH (SUTURE) ×1 IMPLANT
SUT PLAIN ABS 2-0 54XMFL TIE (SUTURE) ×3 IMPLANT
SUT VIC AB 0 CT1 27 (SUTURE) ×2
SUT VIC AB 0 CT1 27XBRD ANBCTR (SUTURE) ×3 IMPLANT
SUT VIC AB 2-0 CT1 27 (SUTURE) ×4
SUT VIC AB 2-0 CT1 TAPERPNT 27 (SUTURE) ×4 IMPLANT
SUT VIC AB 4-0 KS 27 (SUTURE) ×4 IMPLANT
SUT VIC AB 4-0 PS2 27 (SUTURE) ×3 IMPLANT
TOWEL OR 17X24 6PK STRL BLUE (TOWEL DISPOSABLE) ×3 IMPLANT
TRAY FOLEY W/BAG SLVR 14FR LF (SET/KITS/TRAYS/PACK) ×3 IMPLANT
WATER STERILE IRR 1000ML POUR (IV SOLUTION) ×3 IMPLANT

## 2023-06-26 NOTE — Progress Notes (Signed)
FACULTY PRACTICE ANTEPARTUM COMPREHENSIVE PROGRESS NOTE  Anne Richardson is a 37 y.o. [id number] at [redacted]w[redacted]d who is admitted for preeclampsia with severe features (HA, visual changes).  Estimated Date of Delivery: 07/10/23  Length of Stay:  0 Days. Admitted 06/25/2023  Subjective: Feeling ok this AM. Reports continued headache but did not want to take any more medications for her HA. Denies RUQ/epigastric pain/CP/SOB.  Patient reports good fetal movement.  She reports no uterine contractions, no bleeding and no loss of fluid per vagina.  Vitals:  Blood pressure (!) 109/44, pulse 99, temperature 98.2 F (36.8 C), temperature source Oral, resp. rate 18, height 5\' 6"  (1.676 m), weight (!) 145.2 kg, last menstrual period 10/03/2022, SpO2 97 %, unknown if currently breastfeeding. Physical Examination: CONSTITUTIONAL: Well-developed, well-nourished female in no acute distress.  NEUROLOGIC: Alert and oriented to person, place, and time.  CARDIOVASCULAR: Normal heart rate noted RESPIRATORY: Effort normal, no problems with respiration noted ABDOMEN: Soft, nontender, nondistended, gravid.  Fetal monitoring: FHR: 135 bpm, Variability: moderate, Accelerations: Present, Decelerations: Absent  Uterine activity: 0 contractions per hour  Results for orders placed or performed during the hospital encounter of 06/25/23 (from the past 48 hour(s))  Protein / creatinine ratio, urine     Status: Abnormal   Collection Time: 06/25/23  2:44 PM  Result Value Ref Range   Creatinine, Urine 211 mg/dL   Total Protein, Urine 127 mg/dL    Comment: NO NORMAL RANGE ESTABLISHED FOR THIS TEST   Protein Creatinine Ratio 0.60 (H) 0.00 - 0.15 mg/mg[Cre]    Comment: Performed at [facility], [address] [place], [place] [address]  CBC     Status: Abnormal   Collection Time: 06/25/23  3:00 PM  Result Value Ref Range   WBC 12.4 (H) 4.0 - 10.5 K/uL   RBC 4.04 3.87 - 5.11 MIL/uL   Hemoglobin 12.2 12.0 - 15.0 g/dL    HCT 81.1 91.4 - 78.2 %   MCV 93.3 80.0 - 100.0 fL   MCH 30.2 26.0 - 34.0 pg   MCHC 32.4 30.0 - 36.0 g/dL   RDW 95.6 21.3 - 08.6 %   Platelets 348 150 - 400 K/uL   nRBC 0.0 0.0 - 0.2 %    Comment: Performed at [facility], [address], [place], [place] [address]  Comprehensive metabolic panel     Status: Abnormal   Collection Time: 06/25/23  3:00 PM  Result Value Ref Range   Sodium 134 (L) 135 - 145 mmol/L   Potassium 4.0 3.5 - 5.1 mmol/L   Chloride 103 98 - 111 mmol/L   CO2 22 22 - 32 mmol/L   Glucose, Bld 120 (H) 70 - 99 mg/dL    Comment: Glucose reference range applies only to samples taken after fasting for at least 8 hours.   BUN 8 6 - 20 mg/dL   Creatinine, Ser 9.62 0.44 - 1.00 mg/dL   Calcium 9.8 8.9 - 95.2 mg/dL   Total Protein 6.3 (L) 6.5 - 8.1 g/dL   Albumin 2.5 (L) 3.5 - 5.0 g/dL   AST 20 15 - 41 U/L   ALT 14 0 - 44 U/L   Alkaline Phosphatase 106 38 - 126 U/L   Total Bilirubin 0.4 0.3 - 1.2 mg/dL   GFR, Estimated >84 >13 mL/min    Comment: (NOTE) Calculated using the CKD-EPI Creatinine Equation (2021)    Anion gap 9 5 - 15    Comment: Performed at [facility], [address], [place], [place] [address]  Type and screen [facility]     Status: None (Preliminary result)   Collection Time: 06/25/23  3:00 PM  Result Value Ref Range   ABO/RH(D) B POS    Antibody Screen NEG    Sample Expiration      06/28/2023,2359 Performed at [facility], [address], [place], [place] [address]    Unit Number [id number]    Blood Component Type RED CELLS,LR    Unit division 00    Status of Unit ALLOCATED    Transfusion Status OK TO TRANSFUSE    Crossmatch Result Compatible    Unit Number [id number]    Blood Component Type RED CELLS,LR    Unit division 00    Status of Unit ALLOCATED    Transfusion Status OK TO TRANSFUSE    Crossmatch Result Compatible   HIV Antibody (routine testing w rflx)     Status: None   Collection Time:  06/25/23  5:21 PM  Result Value Ref Range   HIV Screen 4th Generation wRfx Non Reactive Non Reactive    Comment: Performed at [facility], [address], [place], [place] [address]  Prepare RBC (crossmatch)     Status: None   Collection Time: 06/25/23  6:21 PM  Result Value Ref Range   Order Confirmation      ORDER PROCESSED BY BLOOD BANK Performed at [facility], [address], [place], [place] [address]     No results found.  Current scheduled medications  docusate sodium  100 mg Oral Daily   prenatal multivitamin  1 tablet Oral Q1200    I have reviewed the patient's current medications.  ASSESSMENT: Active Problems:   Pre-eclampsia, severe  PLAN: The risks of surgery were discussed with the patient including but were not limited to: bleeding which may require transfusion, reoperation, or additional procedures like hysterectomy in the event of life-threatening hemorrhage; infection which may require antibiotics; injury to bowel, bladder, ureters or other surrounding organs; injury to the fetus; formation of adhesions; placental abnormalities with subsequent pregnancies; incisional problems; thromboembolic phenomenon and other postoperative/anesthesia complications. Specifically reviewed elevated risks of hemorrhage necessitating hysterectomy, wound complication, and injury to intraabdominal organs/infant in s/o 4 prior cesareans  Pt reports she no longer desires permanent sterilization and would like Nexplanon for postpartum birth control.  The patient concurred with the proposed plan, giving informed written consent for the procedure.   Patient has been NPO since midnight. Preoperative prophylactic antibiotics and SCDs ordered on call to the OR.   Continue magnesium for seizure ppx.   To OR when ready.  Harvie Bridge, MD Obstetrician & Gynecologist, [facility] for [employer], [facility]

## 2023-06-26 NOTE — Anesthesia Procedure Notes (Signed)
Spinal  Patient location during procedure: OR Start time: 06/26/2023 10:45 AM End time: 06/26/2023 10:53 AM Reason for block: surgical anesthesia Staffing Performed: anesthesiologist  Anesthesiologist: Trevor Iha, MD Performed by: Trevor Iha, MD Authorized by: Trevor Iha, MD   Preanesthetic Checklist Completed: patient identified, IV checked, site marked, risks and benefits discussed, surgical consent, monitors and equipment checked, pre-op evaluation and timeout performed Spinal Block Patient position: sitting Prep: DuraPrep and site prepped and draped Patient monitoring: cardiac monitor, continuous pulse ox, blood pressure and heart rate Approach: midline Location: L3-4 Injection technique: catheter Needle Needle type: Tuohy, Sprotte and Whitacre  Needle gauge: 27 G Needle length: 12.7 cm Needle insertion depth: 5 cm Catheter type: closed end flexible Catheter size: 19 g Catheter at skin depth: 14 cm Assessment Sensory level: T4 Events: CSF return Additional Notes 1 Attempt (s). Pt tolerated procedure well.

## 2023-06-26 NOTE — Anesthesia Preprocedure Evaluation (Addendum)
Anesthesia Evaluation  Patient identified by MRN, date of birth, ID band Patient awake    Reviewed: Allergy & Precautions, NPO status , Patient's Chart, lab work & pertinent test results  Airway Mallampati: II  TM Distance: >3 FB Neck ROM: Full    Dental no notable dental hx. (+) Teeth Intact, Dental Advisory Given   Pulmonary Current Smoker   Pulmonary exam normal breath sounds clear to auscultation       Cardiovascular hypertension, Normal cardiovascular exam Rhythm:Regular Rate:Normal     Neuro/Psych   Anxiety Depression       GI/Hepatic   Endo/Other  diabetes  Morbid obesity  Renal/GU      Musculoskeletal   Abdominal   Peds  Hematology   Anesthesia Other Findings All: PCN  Reproductive/Obstetrics (+) Pregnancy ? Accreta  C/S x3 prior                              Anesthesia Physical Anesthesia Plan  ASA: 3  Anesthesia Plan: Combined Spinal and Epidural   Post-op Pain Management: Ofirmev IV (intra-op)*   Induction:   PONV Risk Score and Plan: Treatment may vary due to age or medical condition and Ondansetron  Airway Management Planned: Nasal Cannula and Natural Airway  Additional Equipment: None  Intra-op Plan:   Post-operative Plan:   Informed Consent: I have reviewed the patients History and Physical, chart, labs and discussed the procedure including the risks, benefits and alternatives for the proposed anesthesia with the patient or authorized representative who has indicated his/her understanding and acceptance.     Dental advisory given  Plan Discussed with:   Anesthesia Plan Comments: (38 wk G8P4 w gHtn, gDm , Bmi 51.6, hx of c/s x3  and ? Placenta acreta for repeat c/s x4 under CSE , 2 X18 g IV access and have 2 Units of PRBC in room upon before incision )        Anesthesia Quick Evaluation

## 2023-06-26 NOTE — Discharge Summary (Signed)
Postpartum Discharge Summary  Date of Service updated***     Patient Name: Anne Richardson DOB: Dec 15, 1986 MRN: [id number]  Date of admission: 06/25/2023 Delivery date:06/26/2023  Delivering provider: Adam Phenix  Date of discharge: 06/26/2023  Admitting diagnosis: Preeclampsia [O14.90] Intrauterine pregnancy: [redacted]w[redacted]d     Secondary diagnosis:  Principal Problem:   Preeclampsia Active Problems:   Pre-eclampsia, severe  Additional problems: ***    Discharge diagnosis: {DX.:23714}                                              Post partum procedures:{Postpartum procedures:23558} Augmentation: {Augmentation:20782} Complications: {OB Labor/Delivery Complications:20784}  Hospital course: {Courses:23701}  Magnesium Sulfate received: {Mag received:30440022} BMZ received: {BMZ received:30440023} Rhophylac:{Rhophylac received:30440032} MMR:{MMR:30440033} T-DaP:{Tdap:23962} Flu: {GNF:62130} Transfusion:{Transfusion received:30440034}  Physical exam  Vitals:   06/26/23 1345 06/26/23 1400 06/26/23 1504 06/26/23 1540  BP: 116/72 (!) 112/56 129/64 111/68  Pulse: (!) 104 94 94 90  Resp: 13 12 14 16   Temp:   97.7 F (36.5 C) 97.7 F (36.5 C)  TempSrc:   Oral Oral  SpO2: 100% 100% 96% 100%  Weight:      Height:       General: {Exam; general:21111117} Lochia: {Desc; appropriate/inappropriate:30686::"appropriate"} Uterine Fundus: {Desc; firm/soft:30687} Incision: {Exam; incision:21111123} DVT Evaluation: {Exam; dvt:2111122} Labs: Lab Results  Component Value Date   WBC 12.0 (H) 06/26/2023   HGB 11.6 (L) 06/26/2023   HCT 35.6 (L) 06/26/2023   MCV 90.8 06/26/2023   PLT 330 06/26/2023      Latest Ref Rng & Units 06/25/2023    3:00 PM  CMP  Glucose 70 - 99 mg/dL 865   BUN 6 - 20 mg/dL 8   Creatinine 7.84 - 6.96 mg/dL 2.95   Sodium 284 - 132 mmol/L 134   Potassium 3.5 - 5.1 mmol/L 4.0   Chloride 98 - 111 mmol/L 103   CO2 22 - 32 mmol/L 22   Calcium 8.9 - 10.3 mg/dL  9.8   Total Protein 6.5 - 8.1 g/dL 6.3   Total Bilirubin 0.3 - 1.2 mg/dL 0.4   Alkaline Phos 38 - 126 U/L 106   AST 15 - 41 U/L 20   ALT 0 - 44 U/L 14    Edinburgh Score:    07/26/2017   12:00 PM  Edinburgh Postnatal Depression Scale Screening Tool  I have been able to laugh and see the funny side of things. 0  I have looked forward with enjoyment to things. 0  I have blamed myself unnecessarily when things went wrong. 0  I have been anxious or worried for no good reason. 0  I have felt scared or panicky for no good reason. 0  Things have been getting on top of me. 1  I have been so unhappy that I have had difficulty sleeping. 0  I have felt sad or miserable. 0  I have been so unhappy that I have been crying. 0  The thought of harming myself has occurred to me. 0  Edinburgh Postnatal Depression Scale Total 1     After visit meds:  Allergies as of 06/26/2023       Reactions   Penicillins Itching, Swelling   Patient tolerates: amoxicillin, cefazolin, and cephalexin Has patient had a PCN reaction causing immediate rash, facial/tongue/throat swelling, SOB or lightheadedness with hypotension: No Has patient had a PCN  reaction causing severe rash involving mucus membranes or skin necrosis: No Has patient had a PCN reaction that required hospitalization No Has patient had a PCN reaction occurring within the last 10 years: No If all of the above answers are "NO", then may proceed with Cephalosporin use.   Pork-derived Products Other (See Comments)   Religious reasons      Med Rec must be completed prior to using this SMARTLINK***        Discharge home in stable condition Infant Feeding: {Baby feeding:23562} Infant Disposition:{CHL IP OB HOME WITH ZOXWRU:04540} Discharge instruction: per After Visit Summary and Postpartum booklet. Activity: Advance as tolerated. Pelvic rest for 6 weeks.  Diet: {OB JWJX:91478295} Future Appointments:No future appointments. Follow up  Visit:   Please schedule this patient for a {Visit type:23955} postpartum visit in {Postpartum visit:23953} with the following provider: {Provider type:23954}. Additional Postpartum F/U:{PP Procedure:23957}  {Risk level:23960} pregnancy complicated by: {complication:23959} Delivery mode:  C-Section, Low Transverse  Anticipated Birth Control:  {Birth Control:23956}   06/26/2023 Scheryl Darter, MD

## 2023-06-26 NOTE — Plan of Care (Signed)
  Problem: Education: Goal: Knowledge of disease or condition will improve Outcome: Progressing Goal: Knowledge of the prescribed therapeutic regimen will improve Outcome: Progressing   Problem: Fluid Volume: Goal: Peripheral tissue perfusion will improve Outcome: Progressing   Problem: Clinical Measurements: Goal: Complications related to disease process, condition or treatment will be avoided or minimized Outcome: Progressing   Problem: Education: Goal: Knowledge of General Education information will improve Description: Including pain rating scale, medication(s)/side effects and non-pharmacologic comfort measures Outcome: Progressing   Problem: Health Behavior/Discharge Planning: Goal: Ability to manage health-related needs will improve Outcome: Progressing   Problem: Clinical Measurements: Goal: Ability to maintain clinical measurements within normal limits will improve Outcome: Progressing Goal: Will remain free from infection Outcome: Progressing Goal: Diagnostic test results will improve Outcome: Progressing Goal: Respiratory complications will improve Outcome: Progressing Goal: Cardiovascular complication will be avoided Outcome: Progressing   Problem: Activity: Goal: Risk for activity intolerance will decrease Outcome: Progressing   Problem: Nutrition: Goal: Adequate nutrition will be maintained Outcome: Progressing   Problem: Coping: Goal: Level of anxiety will decrease Outcome: Progressing   Problem: Elimination: Goal: Will not experience complications related to bowel motility Outcome: Progressing Goal: Will not experience complications related to urinary retention Outcome: Progressing   Problem: Pain Managment: Goal: General experience of comfort will improve Outcome: Progressing   Problem: Safety: Goal: Ability to remain free from injury will improve Outcome: Progressing   Problem: Skin Integrity: Goal: Risk for impaired skin integrity will  decrease Outcome: Progressing   Problem: Education: Goal: Knowledge of disease or condition will improve Outcome: Progressing Goal: Knowledge of the prescribed therapeutic regimen will improve Outcome: Progressing Goal: Individualized Educational Video(s) Outcome: Progressing   Problem: Clinical Measurements: Goal: Complications related to the disease process, condition or treatment will be avoided or minimized Outcome: Progressing   Problem: Education: Goal: Knowledge of the prescribed therapeutic regimen will improve Outcome: Progressing Goal: Understanding of sexual limitations or changes related to disease process or condition will improve Outcome: Progressing Goal: Individualized Educational Video(s) Outcome: Progressing   Problem: Self-Concept: Goal: Communication of feelings regarding changes in body function or appearance will improve Outcome: Progressing   Problem: Skin Integrity: Goal: Demonstration of wound healing without infection will improve Outcome: Progressing   Problem: Education: Goal: Knowledge of condition will improve Outcome: Progressing Goal: Individualized Educational Video(s) Outcome: Progressing Goal: Individualized Newborn Educational Video(s) Outcome: Progressing   Problem: Activity: Goal: Will verbalize the importance of balancing activity with adequate rest periods Outcome: Progressing Goal: Ability to tolerate increased activity will improve Outcome: Progressing   Problem: Coping: Goal: Ability to identify and utilize available resources and services will improve Outcome: Progressing   Problem: Life Cycle: Goal: Chance of risk for complications during the postpartum period will decrease Outcome: Progressing   Problem: Role Relationship: Goal: Ability to demonstrate positive interaction with newborn will improve Outcome: Progressing   Problem: Skin Integrity: Goal: Demonstration of wound healing without infection will  improve Outcome: Progressing

## 2023-06-26 NOTE — Transfer of Care (Signed)
Immediate Anesthesia Transfer of Care Note  Patient: Anne Richardson  Procedure(s) Performed: CESAREAN SECTION (Abdomen)  Patient Location: PACU  Anesthesia Type:Spinal and Epidural  Level of Consciousness: awake, alert , and oriented  Airway & Oxygen Therapy: Patient Spontanous Breathing  Post-op Assessment: Report given to RN and Post -op Vital signs reviewed and stable  Post vital signs: Reviewed and stable  Last Vitals:  Vitals Value Taken Time  BP 120/60 06/26/23 1301  Temp 36.5 C 06/26/23 1301  Pulse 94 06/26/23 1301  Resp    SpO2 100 % 06/26/23 1300  Vitals shown include unvalidated device data.  Last Pain:  Vitals:   06/26/23 0754  TempSrc:   PainSc: 8          Complications: No notable events documented.

## 2023-06-26 NOTE — Op Note (Signed)
[facility] PROCEDURE DATE: 06/26/2023  PREOPERATIVE DIAGNOSES: Intrauterine pregnancy at [redacted]w[redacted]d weeks gestation; previous uterine incision kerr x3 or greater  POSTOPERATIVE DIAGNOSES: The same  PROCEDURE: Low Transverse Cesarean Section  SURGEON:  Adam Phenix, MD  ASSISTANT:  Dr. [place] Bing and Dr. Randa Evens Autry-Lott. An experienced assistant was required given the standard of surgical care given the complexity of the case.  This assistant was needed for exposure, dissection, suctioning, retraction, instrument exchange, assisting with delivery with administration of fundal pressure, and for overall help during the procedure.  ANESTHESIOLOGY TEAM: Anesthesiologist: Trevor Iha, MD CRNA: Graciela Husbands, CRNA  INDICATIONS: Anne Richardson is a 37 y.o. [phone] at [redacted]w[redacted]d here for cesarean section secondary to the indications listed under preoperative diagnoses; please see preoperative note for further details.  The risks of surgery were discussed with the patient including but were not limited to: bleeding which may require transfusion or reoperation; infection which may require antibiotics; injury to bowel, bladder, ureters or other surrounding organs; injury to the fetus; need for additional procedures including hysterectomy in the event of a life-threatening hemorrhage; formation of adhesions; placental abnormalities wth subsequent pregnancies; incisional problems; thromboembolic phenomenon and other postoperative/anesthesia complications.  The patient concurred with the proposed plan, giving informed written consent for the procedure.    FINDINGS:  Viable female infant in cephalic presentation.  Apgars 8 and 9.  Clear amniotic fluid.  Intact placenta, three vessel cord.  Normal uterus, fallopian tubes and ovaries bilaterally.  ANESTHESIA: Spinal  INTRAVENOUS FLUIDS: 1500 ml   ESTIMATED BLOOD LOSS: 1034 ml URINE OUTPUT:  200 ml SPECIMENS: Placenta sent to  pathology COMPLICATIONS: None immediate  PROCEDURE IN DETAIL:  The patient preoperatively received intravenous antibiotics and had sequential compression devices applied to her lower extremities.  She was then taken to the operating room where spinal anesthesia was administered and was found to be adequate. She was then placed in a dorsal supine position with a leftward tilt, and prepped and draped in a sterile manner.  Traxxi was placed to elevate the pannous and foley catheter was placed into her bladder and attached to constant gravity.  After an adequate timeout was performed, a Pfannenstiel skin incision was made with scalpel on her preexisting scarand carried through to the underlying layer of fascia. The fascia was incised in the midline, and this incision was extended bilaterally in a blunt fashion.  The underlying rectus muscles were dissected off the fascia superiorly and inferiorly in a blunt fashion. The rectus muscles were separated in the midline and the peritoneum was entered bluntly. The fascia was incised in the midline, and this incision was extended bilaterally using the Mayo scissors.  Kocher clamps were applied to the superior aspect of the fascial incision and the underlying rectus muscles were dissected off bluntly and sharply.  A similar process was carried out on the inferior aspect of the fascial incision. The rectus muscles were separated in the midline and the peritoneum was entered bluntly. The Alexis self-retaining retractor was introduced into the abdominal cavity.  Attention was turned to the lower uterine segment where a low transverse hysterotomy was made with a scalpel and extended bilaterally bluntly.  The anterior placenta was at the incision and was entered to reach the uterine cavity. Clear fluid. The infant was successfully delivered, the cord was clamped and cut after one minute, and the infant was handed over to the awaiting neonatology team. Uterine massage was then  administered, and the placenta delivered  intact with a three-vessel cord. The uterus was then cleared of clots and debris.  The hysterotomy was closed with 0 Vicryl in a running locked fashion, and an imbricating layer was also placed with 0 Vicryl. The pelvis was cleared of all clot and debris. Hemostasis was confirmed on all surfaces.  The retractor was removed.  The peritoneum was closed with a 0 Vicryl running stitch The fascia was then closed using 0 Vicryl. The subcutaneous layer was irrigated, reapproximated with 2-0 plain gut interrupted stitches, and the skin was closed with a 4-0 Vicryl subcuticular stitch. The patient tolerated the procedure well. Sponge, instrument and needle counts were correct x 3.  She was taken to the recovery room in stable condition.   Adam Phenix, MD Obstetrician & Gynecologist, [facility] for [facility], [facility]

## 2023-06-26 NOTE — Anesthesia Postprocedure Evaluation (Signed)
Anesthesia Post Note  Patient: Anne Richardson  Procedure(s) Performed: CESAREAN SECTION (Abdomen)     Patient location during evaluation: Mother Baby Anesthesia Type: Spinal Level of consciousness: oriented and awake and alert Pain management: pain level controlled Vital Signs Assessment: post-procedure vital signs reviewed and stable Respiratory status: spontaneous breathing and respiratory function stable Cardiovascular status: blood pressure returned to baseline and stable Postop Assessment: no headache, no backache, no apparent nausea or vomiting and able to ambulate Anesthetic complications: no   No notable events documented.  Last Vitals:  Vitals:   06/26/23 1815 06/26/23 1945  BP:  115/62  Pulse:  90  Resp: 18 18  Temp:  36.6 C  SpO2:  96%    Last Pain:  Vitals:   06/26/23 1945  TempSrc: Oral  PainSc:    Pain Goal: Patients Stated Pain Goal: 2 (06/26/23 1430)                 Trevor Iha

## 2023-06-27 LAB — CBC
HCT: 32.1 % — ABNORMAL LOW (ref 36.0–46.0)
Hemoglobin: 10.5 g/dL — ABNORMAL LOW (ref 12.0–15.0)
MCH: 30.3 pg (ref 26.0–34.0)
MCHC: 32.7 g/dL (ref 30.0–36.0)
MCV: 92.8 fL (ref 80.0–100.0)
Platelets: 319 10*3/uL (ref 150–400)
RBC: 3.46 MIL/uL — ABNORMAL LOW (ref 3.87–5.11)
RDW: 15 % (ref 11.5–15.5)
WBC: 20.5 10*3/uL — ABNORMAL HIGH (ref 4.0–10.5)
nRBC: 0 % (ref 0.0–0.2)

## 2023-06-27 MED ORDER — FUROSEMIDE 20 MG PO TABS
20.0000 mg | ORAL_TABLET | Freq: Two times a day (BID) | ORAL | Status: DC
  Administered 2023-06-27 – 2023-06-28 (×2): 20 mg via ORAL
  Filled 2023-06-27 (×2): qty 1

## 2023-06-27 MED ORDER — DEXAMETHASONE SODIUM PHOSPHATE 10 MG/ML IJ SOLN
INTRAMUSCULAR | Status: AC
  Filled 2023-06-27: qty 1

## 2023-06-27 MED ORDER — POLYETHYLENE GLYCOL 3350 17 G PO PACK
17.0000 g | PACK | Freq: Every day | ORAL | Status: DC
  Administered 2023-06-27 – 2023-06-28 (×2): 17 g via ORAL
  Filled 2023-06-27 (×2): qty 1

## 2023-06-27 NOTE — Progress Notes (Signed)
Daily Postpartum Note  Admission Date: 06/25/2023 Current Date: 06/27/2023 11:41 AM  Anne Richardson is a 37 y.o. [id number] POD#1 s/p RLTCS at [redacted]w[redacted]d for severe pre-eclampsia (HAs and mild range BPs)  Pregnancy complicated by: Patient Active Problem List   Diagnosis Date Noted   Pre-eclampsia, severe 06/26/2023   Preeclampsia 06/26/2023   Supraumbilical hernia 06/14/2023   Anticoagulated 06/04/2023   RED Chart Rounds pt 02/20/2023   History of gestational hypertension 12/31/2022   Supervision of high risk pregnancy in first trimester 11/30/2022   History of pulmonary embolism 02/12/2022   AMA (advanced maternal age) multigravida 35+ 02/12/2022   BMI 50.0-59.9, adult (HCC) 02/12/2022   Schizoaffective disorder, bipolar type (HCC) 05/20/2018   PTSD (post-traumatic stress disorder) 05/20/2018   Previous cesarean section 03/21/2016    Overnight/24hr events:  none  Subjective:  Patient feels well. No flatus or ambulation yet. Taking PO w/o issue. Foley still in  Objective:    Current Vital Signs 24h Vital Sign Ranges  T 98.6 F (37 C) Temp  Avg: 97.8 F (36.6 C)  Min: 97 F (36.1 C)  Max: 98.6 F (37 C)  BP (!) 121/58 BP  Min: 99/62  Max: 132/66  HR 100 Pulse  Avg: 97.1  Min: 88  Max: 110  RR 18 Resp  Avg: 16.1  Min: 10  Max: 18  SaO2 97 % Room Air SpO2  Avg: 98.1 %  Min: 95 %  Max: 100 %       24 Hour I/O Current Shift I/O  Time Ins Outs 07/06 0701 - 07/07 0700 In: 4033.3 [P.O.:704; I.V.:3329.3] Out: 3759 [Urine:2725] 07/07 0701 - 07/07 1900 In: 170.8 [I.V.:170.8] Out: 100 [Urine:100]   Patient Vitals for the past 24 hrs:  BP Temp Temp src Pulse Resp SpO2  06/27/23 1135 (!) 121/58 98.6 F (37 C) Oral 100 18 97 %  06/27/23 1023 99/62 -- -- (!) 110 -- --  06/27/23 1021 124/66 -- -- (!) 103 -- --  06/27/23 1017 132/66 -- -- (!) 102 -- --  06/27/23 0850 -- -- -- -- 17 --  06/27/23 0823 (!) 115/58 97.7 F (36.5 C) Oral (!) 107 16 98 %  06/27/23 0750 -- -- -- -- 16  --  06/27/23 0345 -- -- -- -- -- 98 %  06/27/23 0340 -- -- -- -- -- 95 %  06/27/23 0335 -- -- -- -- -- 96 %  06/27/23 0331 123/62 98 F (36.7 C) Oral 97 16 97 %  06/27/23 0330 -- -- -- -- -- 95 %  06/27/23 0137 114/62 97.6 F (36.4 C) Oral 91 17 99 %  06/27/23 0115 -- -- -- -- 18 --  06/26/23 1945 115/62 97.9 F (36.6 C) Oral 90 18 96 %  06/26/23 1944 115/62 -- -- 97 -- --  06/26/23 1815 -- -- -- -- 18 --  06/26/23 1700 -- -- -- -- 17 --  06/26/23 1653 115/69 97.8 F (36.6 C) Oral 88 -- 100 %  06/26/23 1600 -- -- -- -- 18 --  06/26/23 1540 111/68 97.8 F (36.6 C) Oral 90 16 100 %  06/26/23 1504 129/64 97.7 F (36.5 C) Oral 94 14 96 %  06/26/23 1400 (!) 112/56 -- -- 94 12 100 %  06/26/23 1345 116/72 -- -- (!) 104 13 100 %  06/26/23 1330 (!) 117/56 (!) 97 F (36.1 C) Temporal 93 10 100 %  06/26/23 1315 (!) 107/58 -- -- 97 18 100 %  06/26/23 1301  120/60 97.7 F (36.5 C) -- 94 18 100 %   Physical exam: General: Well nourished, well developed female in no acute distress. Abdomen: obese, soft, mildly distended, c/d/I dressing, +BS Cardiovascular: S1, S2 normal, no murmur, rub or gallop, regular rate and rhythm Respiratory: CTAB Extremities: no clubbing, cyanosis or edema Skin: Warm and dry.  GU: clear UOP in foley  Medications: Current Facility-Administered Medications  Medication Dose Route Frequency Provider Last Rate Last Admin   acetaminophen (TYLENOL) tablet 650 mg  650 mg Oral Q4H PRN Lennart Pall, MD   650 mg at 06/26/23 2151   calcium carbonate (TUMS - dosed in mg elemental calcium) chewable tablet 400 mg of elemental calcium  2 tablet Oral Q4H PRN Lennart Pall, MD       coconut oil  1 Application Topical PRN Adam Phenix, MD       witch hazel-glycerin (TUCKS) pad 1 Application  1 Application Topical PRN Adam Phenix, MD       And   dibucaine (NUPERCAINAL) 1 % rectal ointment 1 Application  1 Application Rectal PRN Adam Phenix, MD        diphenhydrAMINE (BENADRYL) injection 12.5 mg  12.5 mg Intravenous Q4H PRN Trevor Iha, MD   12.5 mg at 06/27/23 0342   Or   diphenhydrAMINE (BENADRYL) capsule 25 mg  25 mg Oral Q4H PRN Trevor Iha, MD       diphenhydrAMINE (BENADRYL) capsule 25 mg  25 mg Oral Q6H PRN Adam Phenix, MD   25 mg at 06/27/23 1106   diphenhydrAMINE (BENADRYL) capsule 50 mg  50 mg Oral Q6H PRN Lennart Pall, MD   50 mg at 06/25/23 2359   docusate sodium (COLACE) capsule 100 mg  100 mg Oral Daily Harvie Bridge C, MD   100 mg at 06/27/23 1037   enoxaparin (LOVENOX) injection 70 mg  70 mg Subcutaneous Q24H Adam Phenix, MD   70 mg at 06/27/23 0854   furosemide (LASIX) tablet 20 mg  20 mg Oral BID [place] Bing, MD       gabapentin (NEURONTIN) capsule 100 mg  100 mg Oral TID Adam Phenix, MD   100 mg at 06/27/23 1038   labetalol (NORMODYNE) injection 20 mg  20 mg Intravenous PRN Brand Males, CNM       And   labetalol (NORMODYNE) injection 40 mg  40 mg Intravenous PRN Brand Males, CNM       And   labetalol (NORMODYNE) injection 80 mg  80 mg Intravenous PRN Brand Males, CNM       And   hydrALAZINE (APRESOLINE) injection 10 mg  10 mg Intravenous PRN Camelia Eng L, CNM       ketorolac (TORADOL) 30 MG/ML injection 30 mg  30 mg Intravenous Q6H Adam Phenix, MD   30 mg at 06/27/23 0117   Or   ketorolac (TORADOL) 30 MG/ML injection 30 mg  30 mg Intramuscular Q6H Adam Phenix, MD       lactated ringers infusion   Intravenous Continuous Brand Males, CNM   Stopped at 06/27/23 1137   menthol-cetylpyridinium (CEPACOL) lozenge 3 mg  1 lozenge Oral Q2H PRN Adam Phenix, MD       naloxone [facility]) injection 0.4 mg  0.4 mg Intravenous PRN Trevor Iha, MD       And   sodium chloride flush (NS) 0.9 % injection 3 mL  3 mL  Intravenous PRN Trevor Iha, MD       naloxone HCl [facility]) 2 mg in dextrose 5 % 250 mL infusion  1-2 mcg/kg/hr Intravenous  Continuous PRN Trevor Iha, MD       ondansetron [facility]) injection 4 mg  4 mg Intravenous Q8H PRN Trevor Iha, MD       oxyCODONE (Oxy IR/ROXICODONE) immediate release tablet 5-10 mg  5-10 mg Oral Q4H PRN Adam Phenix, MD   5 mg at 06/27/23 4098   polyethylene glycol (MIRALAX / GLYCOLAX) packet 17 g  17 g Oral Q lunch [place] Bing, MD       prenatal multivitamin tablet 1 tablet  1 tablet Oral Q1200 Adam Phenix, MD       scopolamine (TRANSDERM-SCOP) 1 MG/3DAYS 1.5 mg  1 patch Transdermal Once Trevor Iha, MD   1.5 mg at 06/27/23 1191   senna-docusate (Senokot-S) tablet 2 tablet  2 tablet Oral Daily Adam Phenix, MD   2 tablet at 06/27/23 1037   simethicone (MYLICON) chewable tablet 80 mg  80 mg Oral TID PC Adam Phenix, MD   80 mg at 06/27/23 1014   simethicone (MYLICON) chewable tablet 80 mg  80 mg Oral PRN Adam Phenix, MD       sodium citrate-citric acid (ORACIT) solution 30 mL  30 mL Oral 30 min Pre-Op Harvie Bridge C, MD       zolpidem (AMBIEN) tablet 5 mg  5 mg Oral QHS PRN Adam Phenix, MD        Labs:  Recent Labs  Lab 06/26/23 [phone] 06/26/23 1620 06/27/23 0440  WBC 12.0* 20.9* 20.5*  HGB 11.6* 12.3 10.5*  HCT 35.6* 37.5 32.1*  PLT 330 333 319    Recent Labs  Lab 06/25/23 1500 06/26/23 1620  NA 134*  --   K 4.0  --   CL 103  --   CO2 22  --   BUN 8  --   CREATININE 0.70 0.52  CALCIUM 9.8  --   PROT 6.3*  --   BILITOT 0.4  --   ALKPHOS 106  --   ALT 14  --   AST 20  --   GLUCOSE 120*  --     Radiology:  none  Assessment & Plan:  Patient doing well *PP: routine care. D/c foley. D/w pt and she desires circ (pt consented). B POS/outpatient nexplanon *Severe pre-eclampsia: Mg until later this morning. Continue just lasix for now *PPx: lovenox to start this morning. Will need this for six weeks post op given h/o PE.  *FEN/GI: regular diet, MIVF with Mg *Dispo: likely pod#2-3  Cornelia Copa MD Attending Center  for [facility]Faculty Practice) GYN Consult Phone: [phone] (M-F, [phone]) & [phone]  (Off hours, weekends, holidays)

## 2023-06-27 NOTE — Clinical Social Work Maternal (Signed)
CLINICAL SOCIAL WORK MATERNAL/CHILD NOTE  Patient Details  Name: Anne Richardson MRN: [id number] Date of Birth: 12-13-1986  Date:  06/27/2023  Clinical Social Worker Initiating Note:  Albertine Patricia Date/Time: Initiated:  06/27/23/2015     Child's Name:  Anne Richardson   Biological Parents:  Mother, Father (FOB: Knox Royalty, DOB: 10/06/1976)   Need for Interpreter:  None   Reason for Referral:  Behavioral Health Concerns, Current Substance Use/Substance Use During Pregnancy     Address:  [address] [place] [address]    Phone number:  [phone] (home)     Additional phone number:   Household Members/Support Persons (HM/SP):   Household Member/Support Person 1, Household Member/Support Person 2, Household Member/Support Person 3, Household Member/Support Person 4, Household Member/Support Person 5, Household Member/Support Person 6   HM/SP Name Relationship DOB or Age  HM/SP -1   [name]'s cousin    HM/SP -2 Ah'mya Ward Daughter 12/13/2010  HM/SP -3 Ah'meiya Ward Daughter 06/28/2012  HM/SP -4 Ah'kym Jimmey Ralph Son 03/21/2016  HM/SP -5 Ah'king Jimmey Ralph Son 03/21/2016  HM/SP -6 Royalty Parker Daughter 07/24/2017  HM/SP -7        HM/SP -8          Natural Supports (not living in the home):  Spouse/significant other   Professional Supports: None   Employment: Unemployed   Type of Work:     Education:  Some Materials engineer arranged:    Surveyor, quantity Resources:  Medicaid   Other Resources:  Sales executive  , WIC   Cultural/Religious Considerations Which May Impact Care:  None identified  Strengths:  Ability to meet basic needs  , Home prepared for child  , Pediatrician chosen   Psychotropic Medications:         Pediatrician:    Armed forces operational officer area  Pediatrician List:   [place] [facility] ([address] [employer])  [place]    [place]      Pediatrician Fax Number:    Risk  Factors/Current Problems:  Mental Health Concerns     Cognitive State:  Linear Thinking  , Able to Concentrate  , Alert  , Goal Oriented     Mood/Affect:  Calm  , Comfortable  , Interested     CSW Assessment: CSW was consulted due to maternal history of schizoaffective disorder, bipolar type, PTSD, and marijuana use during pregnancy. CSW met with [name] at bedside to complete assessment. When CSW entered room, [name] was observed sitting in chair near infant who was asleep on back in bassinet. FOB entered the room shortly after CSW began assessment. [name] provided verbal consent for CSW to continue with assessment with FOB present. CSW introduced self and explained reasons for consult. [name] presented as calm, was agreeable to consult and remained engaged throughout encounter.   CSW confirmed demographic information on file. CSW inquired how [name] is feeling emotionally since infant's arrival. [name] shares she is feeling "happy." CSW inquired about [name]'s mental health history. [name] confirms that she was diagnosed with schizoaffective disorder, bipolar type and PTSD. [name] was unable to recall when she was diagnosed with either diagnosis but believes it was at least 5 years ago. [name] shared that she fell about 1 year ago and she feels her memory has been impaired since. CSW inquired if [name] endorsed mental health symptoms during pregnancy. [name] denies endorsing symptoms of mania, depression, anxiety, delusions, or  auditory or visual hallucinations (AVH) during pregnancy or since infant's arrival. CSW inquired about [name]'s history of AVH. Per chart review, [name] reported that she has endorsed auditory hallucinations since the age of 73. [name] states she sometimes sees items like "a string on the floor" that will be gone next time she looks. [name] also reports that she hears noises at times but has not heard voices in the past and denied a history of endorsing command hallucinations. [name] denies a history of postpartum psychosis after the  birth of her other children. CSW inquired if [name] endorsed postpartum depression/anxiety after the birth of her other children. [name] recalls that she did have symptoms of postpartum depression after the birth of her twins in 2017, marked by feeling sad. [name] states she does not remember how long her symptoms lasted. CSW inquired about current mental health treatment. [name] states she is not current on psychotropic medication and does not see a therapist. [name] states she has taken Zoloft in the past but feels she is able to manage symptoms without medication at this time. [name] denied additional outpatient mental health resources. Crisis resources reviewed and provided. [name] identified FOB and her children as supports. [name] did not display any acute mental health signs/symptoms. [name] denied current SI/HI.   CSW provided education regarding the baby blues period vs. perinatal mood disorders, discussed treatment and gave resources for mental health follow up if concerns arise.  CSW recommends self-evaluation during the postpartum time period using the New Mom Checklist from Postpartum Progress and encouraged [name] to contact a medical professional if symptoms are noted at any time.    [name] reports she has a car seat and clothing for infant but is in need of a pack n' play. [name] states she has a pregnancy Futures trader through her health insurance who was supposed to bring a pack n play to [name]'s upcoming prenatal care appointment; however, [name] gave birth to infant sooner than anticipated. CSW explained that a pack n play can be provided free of charge prior to infant's discharge. [name] expressed appreciation. [name] also shared that she would like resources for diapers and wipes. CSW provided [name] with [employer] resource for diapers wipes, and clothing. [name] reports some transportation issues, stating that she has tried using Medicaid transportation on one occasion but was instructed to call DSS directly to schedule her  transportation appointments,waited on hold for 2 hours, and was hung up on. [name] expressed willingness to try Medicaid transportation again and also shared that FOB can call [name] a lyft if needed. CSW provided [name] with Medicaid transportation contact information. [name] declined additional resource needs at this time. [name] has chosen [facility] for infant's follow up care.  CSW informed [name] about hospital drug screen policy due to documented marijuana use during pregnancy. CSW explained that infant's UDS resulted as negative for illicit substances and CDS would be monitored and a CPS report would be made if warranted. [name] expressed understanding. CSW inquired about substance use during pregnancy. [name] states that she smoked marijuana during pregnancy due to nausea and to help with her mental health. [name] reports her last use was January, 2024. [name] denied using other illicit substances during pregnancy. CSW inquired about prior CPS involvement. [name] states a CPS report was made after the birth of her daughter, Casper Harrison in 2018 due to her daughter testing positive for marijuana at birth. [name] states CPS came out to her home after her daughter's birth and closed the  case shortly after.   CSW provided review of Sudden Infant Death Syndrome (SIDS) precautions.    CSW to bring pack n play prior to infant's discharge. One pack n play is provided, CSW identifies no additional need for intervention and no barriers to discharge at this time.  CSW Plan/Description:  No Further Intervention Required/No Barriers to Discharge, Sudden Infant Death Syndrome (SIDS) Education, Perinatal Mood and Anxiety Disorder (PMADs) Education, Hospital Drug Screen Policy Information, Other Information/Referral to [facility], CSW Will Continue to Monitor Umbilical Cord Tissue Drug Screen Results and Make Report if Reggie Pile, LCSWA 06/27/2023, 8:19 PM

## 2023-06-27 NOTE — Lactation Note (Signed)
This note was copied from a baby's chart. Lactation Consultation Note  Patient Name: Anne Richardson ZOXWR'U Date: 06/27/2023 Age:39 hours  LC into room for initial consult. Parent requests LC to come back at a later time as she has some visitors.   LC will come back as possible.   Feeding Mother's Current Feeding Choice: Breast Milk and Formula Nipple Type: Slow - flow  [name] Ancidey 06/27/2023, 03:55 PM

## 2023-06-28 ENCOUNTER — Other Ambulatory Visit (HOSPITAL_COMMUNITY): Payer: Self-pay

## 2023-06-28 MED ORDER — CYCLOBENZAPRINE HCL 10 MG PO TABS
10.0000 mg | ORAL_TABLET | Freq: Once | ORAL | Status: AC
  Administered 2023-06-28: 10 mg via ORAL
  Filled 2023-06-28: qty 1

## 2023-06-28 MED ORDER — OXYCODONE HCL 5 MG PO TABS
5.0000 mg | ORAL_TABLET | ORAL | 0 refills | Status: DC | PRN
  Filled 2023-06-28: qty 12, 2d supply, fill #0

## 2023-06-28 MED ORDER — ENOXAPARIN SODIUM 60 MG/0.6ML IJ SOSY
60.00 mg | PREFILLED_SYRINGE | INTRAMUSCULAR | 0 refills | Status: AC
Start: 2023-06-28 — End: 2023-08-09
  Filled 2023-06-28: qty 18, 30d supply, fill #0

## 2023-06-28 MED ORDER — ACETAMINOPHEN 500 MG PO TABS
1000.0000 mg | ORAL_TABLET | Freq: Three times a day (TID) | ORAL | 0 refills | Status: AC | PRN
  Filled 2023-06-28: qty 100, 17d supply, fill #0

## 2023-06-28 MED ORDER — FUROSEMIDE 20 MG PO TABS
20.0000 mg | ORAL_TABLET | Freq: Two times a day (BID) | ORAL | 0 refills | Status: DC
  Filled 2023-06-28: qty 10, 5d supply, fill #0

## 2023-06-28 MED ORDER — SENNOSIDES-DOCUSATE SODIUM 8.6-50 MG PO TABS
2.0000 | ORAL_TABLET | Freq: Every day | ORAL | 0 refills | Status: DC
  Filled 2023-06-28: qty 30, 15d supply, fill #0

## 2023-06-28 MED ORDER — IBUPROFEN 600 MG PO TABS
600.0000 mg | ORAL_TABLET | Freq: Four times a day (QID) | ORAL | 0 refills | Status: DC | PRN
  Filled 2023-06-28: qty 60, 15d supply, fill #0

## 2023-06-28 NOTE — Progress Notes (Signed)
CSW followed up with [name] at bedside and provided update that the hospital does not have any pack n play's available at this time, [name] verbalized understanding. CSW provided [name] with a list of local resources to assist with resources for baby. [name] confirmed that she will be able to obtain a safe sleeping area for infant and denied any additional needs.   Celso Sickle, LCSW Clinical Social Worker [facility] Cell#: [phone]

## 2023-06-28 NOTE — Lactation Note (Signed)
This note was copied from a baby's chart. Lactation Consultation Note  Patient Name: Anne Richardson Sessions ZOXWR'U Date: 06/28/2023 Age:39 hours   LC asked RN to inquire whether Mom would like to see lactation.  Mom declined.  Lactation services not needed.   Judee Clara 06/28/2023, 12:23 PM

## 2023-06-28 NOTE — Progress Notes (Signed)
Chaplain responded to a consult request for Advance Directive education. Chaplain met with Anne Richardson at her bedside as she prepared for discharge with her newborn son. Chaplain provided basic information including the HCPOA documents and a brochure with more information and information on how to make an outpatient appointment for completion.   Chaplain provided the Advance Directive packet as well as education on Advance Directives-documents an individual completes to communicate their health care directions in advance of a time when they may need them. Chaplain informed pt the documents which may be completed here in the hospital are the Living Will and Health Care Power of [place].   Chaplain informed that the Health Care Power of [place] is a legal document in which an individual names another person, their Health Care Agent, to make health care decisions when the individual is not able to make them for themselves. The Health Care Agent's function can be temporary or permanent depending on the pt's ability to make and communicate those decisions independently. Chaplain informed pt in the absence of a Health Care Power of [place], the state of [place] directs health care providers to look to the following individuals in the order listed: legal guardian; an attorney?in?fact under a general power of attorney (POA) if that POA includes the right to make health care decisions; a husband or wife; a majority of parents and adult children; a majority of adult brothers and sisters; or an individual who has an established relationship with you, who is acting in good faith and who can convey your wishes.  If none of these person are available or willing to make medical decisions on a patient's behalf, the law allows the patient's doctor to make decisions for them as long as another doctor agrees with those decisions.  Chaplain also informed the patient that the Health Care agent has no decision-making authority  over any affairs other than those related to his or her medical care.   The chaplain further educated the pt that a Living Will is a legal document that allows an individual to state his or her desire not to receive life-prolonging measures in the event that they have a condition that is incurable and will result in their death in a short period of time; they are unconscious, and doctors are confident that they will not regain consciousness; and/or they have advanced dementia or other substantial and irreversible loss of mental function. The chaplain informed pt that life-prolonging measures are medical treatments that would only serve to postpone death, including breathing machines, kidney dialysis, antibiotics, artificial nutrition and hydration (tube feeding), and similar forms of treatment and that if an individual is able to express their wishes, they may also make them known without the use of a Living Will, but in the event that an individual is not able to express their wishes themselves, a Living Will allows medical providers and the pt's family and friends ensure that they are not making decisions on the pt's behalf, but rather serving as the pt's voice to convey decisions the pt has already made.   The patient is aware that the decision to create an advance directive is theirs alone and they may chose not to complete the documents or may chose to complete one portion or both.  The patient was informed that they can revoke the documents at any time by striking through them and writing void or by completing new documents, but that it is also advisable that the individual verbally notify interested parties that  their wishes have changed.  They are also aware that the document must be signed in the presence of a notary public and two witnesses and that this can be done while the patient is still admitted to the hospital or after discharge in the community. If they decide to complete Advance Directives  after being discharged from the hospital, they have been advised to notify all interested parties and to provide those documents to their physicians and loved ones in addition to bringing them to the hospital in the event of another hospitalization.   The chaplain informed the pt that if they desire to proceed with completing Advance Directive Documentation while they are still admitted, notary services are typically available at [facility] between the hours of 1:00 and 3:30 Monday-Thursday.    When the patient is ready to have these documents completed, the patient should request that their nurse place a spiritual care consult and indicate that the patient is ready to have their advance directives notarized so that arrangements for witnesses and notary public can be made.  Please page spiritual care if the patient desires further education or has questions.      Please page as further needs arise.  Maryanna Shape. Carley Hammed, M.Div. [facility] Chaplain Pager [phone] Office [phone]

## 2023-06-28 NOTE — Progress Notes (Signed)
Post Partum Day 3 s/p rLTCS Subjective: Reports 8/10 headache. Same quality of HA as prior to delivery. Had initially gone away but has returned. Reports blurred vision in R eye. No CP/SOB, RUQ/epigastric pain.  up ad lib, voiding, tolerating PO, and + flatus  Objective: Blood pressure 124/74, pulse 100, temperature 97.9 F (36.6 C), temperature source Oral, resp. rate 17, height 5\' 6"  (1.676 m), weight (!) 145.2 kg, last menstrual period 10/03/2022, SpO2 98 %, unknown if currently breastfeeding.  Physical Exam:  General: alert, cooperative, and no distress Neuro: A&Ox3, conversant, moving all four extremities spontaneously and without difficulty. No facial droop. Tenderness of right paraspinal muscle.  Lochia: appropriate Uterine Fundus: nonpalpable Incision: honeycomb in place DVT Evaluation: No evidence of DVT seen on physical exam.  Recent Labs    06/26/23 1620 06/27/23 0440  HGB 12.3 10.5*  HCT 37.5 32.1*   Assessment/Plan: Postpartum - Contraception: outpatient Nexplanon - MOF: breast & formula - Rh status: Rh+ - Rubella status: RI - Dispo: anticipate discharge home today if headache resolves - Consults: lactation  Neonatal - Doing well at bedside - Circumcision: desired, complete  3. PreE w/ SF (HA) - s/p mag x 24h PP - normotensive, no meds - lasix 20 BID x 5d PP - suspect tension component of pt's HA. Will trial flexeril & heating pad. OK for dc home if HA resolves  4. Hx VTE - prophylactic lovenox x 6wk PP   LOS: 2 days   Lennart Pall 06/28/2023, 10:29 AM

## 2023-06-29 LAB — SURGICAL PATHOLOGY

## 2023-06-29 LAB — BPAM RBC
ISSUE DATE / TIME: 202407061033
ISSUE DATE / TIME: 202407061033
Unit Type and Rh: 7300

## 2023-06-29 LAB — TYPE AND SCREEN
Unit division: 0
Unit division: 0

## 2023-07-02 ENCOUNTER — Inpatient Hospital Stay (HOSPITAL_COMMUNITY)
Admission: AD | Admit: 2023-07-02 | Discharge: 2023-07-03 | Disposition: A | Discharge status: Home / Self Care | Payer: MEDICAID | Attending: Obstetrics & Gynecology | Admitting: Obstetrics & Gynecology

## 2023-07-02 ENCOUNTER — Inpatient Hospital Stay (HOSPITAL_COMMUNITY): Payer: MEDICAID

## 2023-07-02 ENCOUNTER — Encounter (HOSPITAL_COMMUNITY): Payer: Self-pay | Admitting: Obstetrics and Gynecology

## 2023-07-02 DIAGNOSIS — Z79899 Other long term (current) drug therapy: Secondary | ICD-10-CM | POA: Insufficient documentation

## 2023-07-02 DIAGNOSIS — O9089 Other complications of the puerperium, not elsewhere classified: Secondary | ICD-10-CM | POA: Insufficient documentation

## 2023-07-02 DIAGNOSIS — O165 Unspecified maternal hypertension, complicating the puerperium: Secondary | ICD-10-CM

## 2023-07-02 DIAGNOSIS — Z98891 History of uterine scar from previous surgery: Secondary | ICD-10-CM

## 2023-07-02 DIAGNOSIS — L7682 Other postprocedural complications of skin and subcutaneous tissue: Secondary | ICD-10-CM

## 2023-07-02 DIAGNOSIS — O909 Complication of the puerperium, unspecified: Secondary | ICD-10-CM | POA: Diagnosis present

## 2023-07-02 LAB — CBC WITH DIFFERENTIAL/PLATELET
Abs Immature Granulocytes: 0.18 10*3/uL — ABNORMAL HIGH (ref 0.00–0.07)
Basophils Absolute: 0 10*3/uL (ref 0.0–0.1)
Basophils Relative: 0 %
Eosinophils Absolute: 0.2 10*3/uL (ref 0.0–0.5)
Eosinophils Relative: 1 %
HCT: 30.9 % — ABNORMAL LOW (ref 36.0–46.0)
Hemoglobin: 10.2 g/dL — ABNORMAL LOW (ref 12.0–15.0)
Immature Granulocytes: 2 %
Lymphocytes Relative: 18 %
Lymphs Abs: 2.1 10*3/uL (ref 0.7–4.0)
MCH: 31.1 pg (ref 26.0–34.0)
MCHC: 33 g/dL (ref 30.0–36.0)
MCV: 94.2 fL (ref 80.0–100.0)
Monocytes Absolute: 0.9 10*3/uL (ref 0.1–1.0)
Monocytes Relative: 7 %
Neutro Abs: 8.6 10*3/uL — ABNORMAL HIGH (ref 1.7–7.7)
Neutrophils Relative %: 72 %
Platelets: 391 10*3/uL (ref 150–400)
RBC: 3.28 MIL/uL — ABNORMAL LOW (ref 3.87–5.11)
RDW: 15.2 % (ref 11.5–15.5)
WBC: 12 10*3/uL — ABNORMAL HIGH (ref 4.0–10.5)
nRBC: 0.2 % (ref 0.0–0.2)

## 2023-07-02 LAB — COMPREHENSIVE METABOLIC PANEL
ALT: 19 U/L (ref 0–44)
AST: 17 U/L (ref 15–41)
Albumin: 2.3 g/dL — ABNORMAL LOW (ref 3.5–5.0)
Alkaline Phosphatase: 69 U/L (ref 38–126)
Anion gap: 7 (ref 5–15)
BUN: 14 mg/dL (ref 6–20)
CO2: 25 mmol/L (ref 22–32)
Calcium: 8.9 mg/dL (ref 8.9–10.3)
Chloride: 107 mmol/L (ref 98–111)
Creatinine, Ser: 0.86 mg/dL (ref 0.44–1.00)
GFR, Estimated: >60 mL/min (ref >60)
Glucose, Bld: 98 mg/dL (ref 70–99)
Potassium: 3.9 mmol/L (ref 3.5–5.1)
Sodium: 139 mmol/L (ref 135–145)
Total Bilirubin: 0.3 mg/dL (ref 0.3–1.2)
Total Protein: 5.7 g/dL — ABNORMAL LOW (ref 6.5–8.1)

## 2023-07-02 LAB — URINALYSIS, ROUTINE W REFLEX MICROSCOPIC
Bilirubin Urine: NEGATIVE
Glucose, UA: NEGATIVE mg/dL
Ketones, ur: NEGATIVE mg/dL
Leukocytes,Ua: NEGATIVE
Nitrite: NEGATIVE
Protein, ur: 30 mg/dL — AB
RBC / HPF: >50 RBC/hpf (ref 0–5)
Specific Gravity, Urine: 1.023 (ref 1.005–1.030)
pH: 5 (ref 5.0–8.0)

## 2023-07-02 LAB — D-DIMER, QUANTITATIVE: D-Dimer, Quant: 2.1 ug/mL-FEU — ABNORMAL HIGH (ref 0.00–0.50)

## 2023-07-02 LAB — FIBRINOGEN: Fibrinogen: 490 mg/dL — ABNORMAL HIGH (ref 210–475)

## 2023-07-02 MED ORDER — NIFEDIPINE 10 MG PO CAPS
20.0000 mg | ORAL_CAPSULE | ORAL | Status: DC | PRN
  Administered 2023-07-02: 20 mg via ORAL

## 2023-07-02 MED ORDER — NIFEDIPINE ER OSMOTIC RELEASE 30 MG PO TB24: 30.0000 mg | ORAL_TABLET | Freq: Every day | ORAL | 0 refills | Status: DC

## 2023-07-02 MED ORDER — LABETALOL HCL 5 MG/ML IV SOLN: 40.0000 mg | INTRAVENOUS | Status: DC | PRN

## 2023-07-02 MED ORDER — KETOROLAC TROMETHAMINE 10 MG PO TABS: 10.0000 mg | ORAL_TABLET | Freq: Four times a day (QID) | ORAL | 0 refills | Status: DC | PRN

## 2023-07-02 MED ORDER — NIFEDIPINE 10 MG PO CAPS
ORAL_CAPSULE | ORAL | Status: AC
  Administered 2023-07-02: 10 mg via ORAL
  Filled 2023-07-02: qty 1

## 2023-07-02 MED ORDER — NIFEDIPINE 10 MG PO CAPS: 10.0000 mg | ORAL_CAPSULE | ORAL | Status: DC | PRN

## 2023-07-02 MED ORDER — LABETALOL HCL 5 MG/ML IV SOLN: 20.0000 mg | INTRAVENOUS | Status: DC | PRN

## 2023-07-02 MED ORDER — FUROSEMIDE 10 MG/ML IJ SOLN
40.0000 mg | Freq: Once | INTRAMUSCULAR | Status: AC
  Administered 2023-07-02: 40 mg via INTRAVENOUS
  Filled 2023-07-02: qty 4

## 2023-07-02 MED ORDER — GABAPENTIN 300 MG PO CAPS: 300.0000 mg | ORAL_CAPSULE | Freq: Three times a day (TID) | ORAL | 0 refills | Status: AC

## 2023-07-02 MED ORDER — FUROSEMIDE 20 MG PO TABS: 20.0000 mg | ORAL_TABLET | Freq: Two times a day (BID) | ORAL | 0 refills | Status: DC

## 2023-07-02 MED ORDER — GABAPENTIN 300 MG PO CAPS
300.0000 mg | ORAL_CAPSULE | Freq: Once | ORAL | Status: AC
  Administered 2023-07-02: 300 mg via ORAL
  Filled 2023-07-02: qty 1

## 2023-07-02 MED ORDER — NIFEDIPINE 10 MG PO CAPS
20.0000 mg | ORAL_CAPSULE | ORAL | Status: DC | PRN
  Filled 2023-07-02: qty 2

## 2023-07-02 MED ORDER — FUROSEMIDE 20 MG PO TABS: 20.0000 mg | ORAL_TABLET | Freq: Two times a day (BID) | ORAL | 0 refills | Status: AC

## 2023-07-02 MED ORDER — KETOROLAC TROMETHAMINE 60 MG/2ML IM SOLN
60.0000 mg | Freq: Once | INTRAMUSCULAR | Status: AC
  Administered 2023-07-02: 60 mg via INTRAMUSCULAR
  Filled 2023-07-02: qty 2

## 2023-07-02 MED ORDER — NIFEDIPINE ER OSMOTIC RELEASE 30 MG PO TB24
30.0000 mg | ORAL_TABLET | Freq: Once | ORAL | Status: AC
  Administered 2023-07-02: 30 mg via ORAL
  Filled 2023-07-02: qty 1

## 2023-07-02 MED ORDER — HYDRALAZINE HCL 20 MG/ML IJ SOLN: 10.0000 mg | INTRAMUSCULAR | Status: DC | PRN

## 2023-07-02 MED ORDER — OXYCODONE HCL 5 MG PO TABS: 5.0000 mg | ORAL_TABLET | ORAL | 0 refills | Status: DC | PRN

## 2023-07-02 MED ORDER — LABETALOL HCL 5 MG/ML IV SOLN: 80.0000 mg | INTRAVENOUS | Status: DC | PRN

## 2023-07-02 NOTE — MAU Note (Signed)
Py says she C/S on Sat- by Dr Barbera Setters home on Monday  (Hearing is poor- x2 mths after an ear infection) Went home with honeycomb dsg. Dsg came off yesterday - today Says dsg had an odor- but not sure if incision does.  Here- bc incision hurts really bad: Meds- Today Ibu 600mg  at - 0830  And took XS Tyl 2 at 0830-   no relief from meds.  And has not taken anymore- partly bc she's had pain under her left breast since Wed - and pain has continued- so she thought she was taking too much meds

## 2023-07-02 NOTE — MAU Provider Note (Signed)
Chief Complaint:  Incisional Pain  HPI   Event Date/Time   First Provider Initiated Contact with Patient 07/02/23 2024     Anne Richardson is a 37 y.o. [id number] at 5 days postpartum after 6th Cesarean section who presents to maternity admissions reporting incisional pain as well as upper left abdominal pain. No other symptoms other than swelling in lower legs.    Pregnancy Course: Had GDM and gestational hypertension (first elevated pressure at 32 weeks). Discharged on lasix (which she has finished) but not procardia since BP normal after delivery. Also has a history of a PE, is on lovenox daily.  Past Medical History:  Diagnosis Date   Anticoagulated 06/04/2023   Anxiety    Chlamydia    Depression    doing good now   Gestational diabetes    Gonorrhea    History of gestational diabetes mellitus (GDM) 12/31/2022   Insufficient prenatal care in third trimester 07/20/2017   Onset of care at 38w   Obesity in pregnancy 11/27/2015   Pregnancy induced hypertension    Pulmonary embolism (HCC)    UTI in pregnancy 12/31/2022   [ ]  ucx toc late jan   OB History  Gravida Para Term Preterm AB Living  8 5 4 1 3 6   SAB IAB Ectopic Multiple Live Births  3 0   1 6    # Outcome Date GA Lbr Len/2nd Weight Sex Type Anes PTL Lv  8 Term 06/26/23 [redacted]w[redacted]d  6 lb 10.2 oz (3.01 kg) M CS-LTranv Spinal, EPI  LIV  7 Term 07/24/17 [redacted]w[redacted]d  7 lb 2.8 oz (3.255 kg) F CS-LTranv Spinal  LIV     Birth Comments: wnl  6A Preterm 03/21/16 [redacted]w[redacted]d  5 lb 15.2 oz (2.7 kg) M CS-LTranv Spinal  LIV  6B Preterm 03/21/16 [redacted]w[redacted]d  4 lb 14.7 oz (2.23 kg) M CS-LTranv Spinal  LIV  5 Term 06/28/12 [redacted]w[redacted]d  9 lb 14.1 oz (4.483 kg) F CS-LTranv Spinal  LIV  4 Term 12/13/10   7 lb 13 oz (3.544 kg) F CS-LTranv   LIV     Birth Comments: high blood pressure, fever, meconium  3 SAB 12/05/09          2 SAB           1 SAB            Past Surgical History:  Procedure Laterality Date   BIOPSY BREAST     BREAST LUMPECTOMY     left    CESAREAN SECTION     CESAREAN SECTION  06/28/2012   Procedure: CESAREAN SECTION;  Surgeon: Lesly Dukes, MD;  Location: [facility];  Service: Gynecology;  Laterality: N/A;   CESAREAN SECTION N/A 03/21/2016   Procedure: CESAREAN SECTION;  Surgeon: Lazaro Arms, MD;  Location: [facility];  Service: Obstetrics;  Laterality: N/A;   CESAREAN SECTION N/A 07/24/2017   Procedure: CESAREAN SECTION;  Surgeon: [name], MD;  [facility];  Service: Obstetrics;  Laterality: N/A;   CESAREAN SECTION N/A 06/26/2023   Procedure: CESAREAN SECTION;  Surgeon: Adam Phenix, MD;  Location: [facility];  Service: Obstetrics;  Laterality: N/A;   WISDOM TOOTH EXTRACTION     age 8   Family History  Problem Relation Age of Onset   Cancer Mother 26       breast   Alcohol abuse Mother    Depression Mother    Hypertension Mother    Depression Sister  Hypertension Sister    Asthma Brother    Depression Brother    Hypertension Brother    Alcohol abuse Maternal Aunt    Hypertension Maternal Aunt    Cancer Maternal Aunt 70       breast ca   Alcohol abuse Maternal Uncle    Hypertension Maternal Uncle    Heart disease Maternal Grandmother    Alcohol abuse Maternal Grandmother    Hypertension Maternal Grandmother    Heart disease Maternal Grandfather    Alcohol abuse Maternal Grandfather    Hypertension Maternal Grandfather    Cancer Maternal Grandfather        prostate and bone   Depression Paternal Grandfather    Alzheimer's disease Paternal Grandmother    Diabetes Paternal Grandmother    Social History   Tobacco Use   Smoking status: Every Day    Types: Cigars   Smokeless tobacco: Never  Vaping Use   Vaping status: Never Used  Substance Use Topics   Alcohol use: No   Drug use: Not Currently    Types: Marijuana    Comment: last use jan 2024   Allergies  Allergen Reactions   Penicillins Itching and Swelling    Patient tolerates: amoxicillin, cefazolin, and cephalexin  Has  patient had a PCN reaction causing immediate rash, facial/tongue/throat swelling, SOB or lightheadedness with hypotension: No Has patient had a PCN reaction causing severe rash involving mucus membranes or skin necrosis: No Has patient had a PCN reaction that required hospitalization No Has patient had a PCN reaction occurring within the last 10 years: No If all of the above answers are "NO", then may proceed with Cephalosporin use.    Pork-Derived Products Other (See Comments)    Religious reasons    Medications Prior to Admission  Medication Sig Dispense Refill Last Dose   cetirizine (ZYRTEC ALLERGY) 10 MG tablet Take 1 tablet (10 mg total) by mouth daily. 30 tablet 11 Past Week   enoxaparin (LOVENOX) 60 MG/0.6ML injection Inject 0.6 mLs (60 mg total) into the skin daily for 42 doses. 25.2 mL 0 07/01/2023   fluticasone (FLONASE) 50 MCG/ACT nasal spray Place 1 spray into both nostrils daily. 16 g 2 Past Week   ibuprofen (ADVIL) 600 MG tablet Take 1 tablet (600 mg total) by mouth every 6 (six) hours as needed. 60 tablet 0 07/02/2023   prenatal vitamin w/FE, FA (PRENATAL 1 + 1) 27-1 MG TABS tablet Take 1 tablet by mouth daily at 12 noon. 30 tablet 11 Past Week   promethazine (PHENERGAN) 25 MG tablet Take 1 tablet (25 mg total) by mouth every 6 (six) hours as needed for nausea. 30 tablet 0 Past Week   senna-docusate (SENOKOT-S) 8.6-50 MG tablet Take 2 tablets by mouth daily. 30 tablet 0 07/01/2023   [DISCONTINUED] furosemide (LASIX) 20 MG tablet Take 1 tablet (20 mg total) by mouth 2 (two) times daily for 5 days. 10 tablet 0 07/02/2023   [DISCONTINUED] oxyCODONE (OXY IR/ROXICODONE) 5 MG immediate release tablet Take 1 tablet (5 mg total) by mouth every 4 (four) hours as needed for severe pain. 12 tablet 0 Past Week   acetaminophen (TYLENOL) 500 MG tablet Take 2 tablets (1,000 mg total) by mouth every 8 (eight) hours as needed for mild pain. 100 tablet 0    albuterol (VENTOLIN HFA) 108 (90 Base)  MCG/ACT inhaler Inhale 2 puffs into the lungs every 6 (six) hours as needed for wheezing or shortness of breath. (Patient not taking: Reported on 06/14/2023)  8.5 g 3    hydrocortisone (ANUSOL-HC) 2.5 % rectal cream Place rectally 2 (two) times daily. 30 g 2    polyethylene glycol (MIRALAX / GLYCOLAX) 17 g packet Take 17 g by mouth daily. (Patient not taking: Reported on 06/14/2023) 28 each 0    I have reviewed patient's Past Medical Hx, Surgical Hx, Family Hx, Social Hx, medications and allergies.   ROS  Pertinent items noted in HPI and remainder of comprehensive ROS otherwise negative.   PHYSICAL EXAM  Patient Vitals for the past 24 hrs:  BP Temp Temp src Pulse Resp SpO2 Height Weight  07/02/23 2330 (!) 143/82 -- -- 99 -- -- -- --  07/02/23 2315 (!) 151/77 -- -- 97 -- -- -- --  07/02/23 2300 (!) 142/77 -- -- 96 -- -- -- --  07/02/23 2245 (!) 141/75 -- -- (!) 103 -- -- -- --  07/02/23 2239 (!) 148/77 -- -- 98 -- 98 % -- --  07/02/23 2215 112/61 -- -- 95 -- -- -- --  07/02/23 2200 (!) 157/87 -- -- 92 -- -- -- --  07/02/23 2151 (!) 156/93 -- -- 94 -- -- -- --  07/02/23 2138 (!) 162/96 -- -- -- -- -- -- --  07/02/23 2115 (!) 157/90 -- -- 90 -- -- -- --  07/02/23 2100 (!) 163/91 -- -- (!) 102 -- -- -- --  07/02/23 2045 (!) 162/96 -- -- 97 -- -- -- --  07/02/23 2030 (!) 153/93 -- -- 100 -- -- -- --  07/02/23 2018 (!) 155/94 -- -- 99 -- -- -- --  07/02/23 1956 (!) 148/87 98.1 F (36.7 C) Oral (!) 103 16 97 % 5\' 6"  (1.676 m) (!) 324 lb 1.6 oz (147 kg)   Constitutional: Well-developed, well-nourished female in no acute distress.  Cardiovascular: normal rate & rhythm, warm and well-perfused Respiratory: normal effort but breathing somewhat shallowly, lung sounds normal GI: Abd soft, distended across transverse colon and over incision, pt has been passing gas and having bowel movements MS: Extremities nontender, no edema, normal ROM Neurologic: Alert and oriented x 4.  GU: no CVA  tenderness Incision: two areas that skin has separated but does not extend inward and is not bleeding or oozing. Advised pt to keep clean and dry now that her dressing is off. Pelvic exam deferred   Labs: Results for orders placed or performed during the hospital encounter of 07/02/23 (from the past 24 hour(s))  CBC with Differential/Platelet     Status: Abnormal   Collection Time: 07/02/23  9:00 PM  Result Value Ref Range   WBC 12.0 (H) 4.0 - 10.5 K/uL   RBC 3.28 (L) 3.87 - 5.11 MIL/uL   Hemoglobin 10.2 (L) 12.0 - 15.0 g/dL   HCT 96.2 (L) 95.2 - 84.1 %   MCV 94.2 80.0 - 100.0 fL   MCH 31.1 26.0 - 34.0 pg   MCHC 33.0 30.0 - 36.0 g/dL   RDW 32.4 40.1 - 02.7 %   Platelets 391 150 - 400 K/uL   nRBC 0.2 0.0 - 0.2 %   Neutrophils Relative % 72 %   Neutro Abs 8.6 (H) 1.7 - 7.7 K/uL   Lymphocytes Relative 18 %   Lymphs Abs 2.1 0.7 - 4.0 K/uL   Monocytes Relative 7 %   Monocytes Absolute 0.9 0.1 - 1.0 K/uL   Eosinophils Relative 1 %   Eosinophils Absolute 0.2 0.0 - 0.5 K/uL   Basophils Relative 0 %   Basophils Absolute  0.0 0.0 - 0.1 K/uL   Immature Granulocytes 2 %   Abs Immature Granulocytes 0.18 (H) 0.00 - 0.07 K/uL  Comprehensive metabolic panel     Status: Abnormal   Collection Time: 07/02/23  9:00 PM  Result Value Ref Range   Sodium 139 135 - 145 mmol/L   Potassium 3.9 3.5 - 5.1 mmol/L   Chloride 107 98 - 111 mmol/L   CO2 25 22 - 32 mmol/L   Glucose, Bld 98 70 - 99 mg/dL   BUN 14 6 - 20 mg/dL   Creatinine, Ser 1.61 0.44 - 1.00 mg/dL   Calcium 8.9 8.9 - 09.6 mg/dL   Total Protein 5.7 (L) 6.5 - 8.1 g/dL   Albumin 2.3 (L) 3.5 - 5.0 g/dL   AST 17 15 - 41 U/L   ALT 19 0 - 44 U/L   Alkaline Phosphatase 69 38 - 126 U/L   Total Bilirubin 0.3 0.3 - 1.2 mg/dL   GFR, Estimated >04 >54 mL/min   Anion gap 7 5 - 15  D-dimer, quantitative     Status: Abnormal   Collection Time: 07/02/23  9:00 PM  Result Value Ref Range   D-Dimer, Quant 2.10 (H) 0.00 - 0.50 ug/mL-FEU  Fibrinogen      Status: Abnormal   Collection Time: 07/02/23  9:00 PM  Result Value Ref Range   Fibrinogen 490 (H) 210 - 475 mg/dL  Urinalysis, Routine w reflex microscopic -Urine, Clean Catch     Status: Abnormal   Collection Time: 07/02/23  9:09 PM  Result Value Ref Range   Color, Urine YELLOW YELLOW   APPearance HAZY (A) CLEAR   Specific Gravity, Urine 1.023 1.005 - 1.030   pH 5.0 5.0 - 8.0   Glucose, UA NEGATIVE NEGATIVE mg/dL   Hgb urine dipstick LARGE (A) NEGATIVE   Bilirubin Urine NEGATIVE NEGATIVE   Ketones, ur NEGATIVE NEGATIVE mg/dL   Protein, ur 30 (A) NEGATIVE mg/dL   Nitrite NEGATIVE NEGATIVE   Leukocytes,Ua NEGATIVE NEGATIVE   RBC / HPF >50 0 - 5 RBC/hpf   WBC, UA 0-5 0 - 5 WBC/hpf   Bacteria, UA RARE (A) NONE SEEN   Squamous Epithelial / HPF 0-5 0 - 5 /HPF   Mucus PRESENT    Imaging:  DG CHEST PORT 1 VIEW  Result Date: 07/02/2023 CLINICAL DATA:  Left-sided chest pain EXAM: PORTABLE CHEST 1 VIEW COMPARISON:  Radiograph and CT 01/25/2022 FINDINGS: Stable cardiomediastinal silhouette. No focal consolidation, pleural effusion, or pneumothorax. No displaced rib fractures. IMPRESSION: No active disease. Electronically Signed   By: Minerva Fester M.D.   On: 07/02/2023 21:19    MDM & MAU COURSE  MDM: High  MAU Course: Orders Placed This Encounter  Procedures   DG CHEST PORT 1 VIEW   CBC with Differential/Platelet   Comprehensive metabolic panel   D-dimer, quantitative   Fibrinogen   Urinalysis, Routine w reflex microscopic -Urine, Clean Catch   Apply Hypertensive Disorders of Pregnancy Care Plan   Notify physician (specify) Confirmatory reading of BP> 160/110 15 minutes later   Apply Hypertensive Disorders of Pregnancy Care Plan   Measure blood pressure   Saline lock IV   Discharge patient   Meds ordered this encounter  Medications   ketorolac (TORADOL) injection 60 mg   gabapentin (NEURONTIN) capsule 300 mg   DISCONTD: labetalol (NORMODYNE) injection 20 mg   DISCONTD:  labetalol (NORMODYNE) injection 40 mg   DISCONTD: labetalol (NORMODYNE) injection 80 mg   DISCONTD: hydrALAZINE (APRESOLINE)  injection 10 mg   AND Linked Order Group    NIFEdipine (PROCARDIA) capsule 10 mg    NIFEdipine (PROCARDIA) capsule 20 mg    NIFEdipine (PROCARDIA) capsule 20 mg    labetalol (NORMODYNE) injection 40 mg   furosemide (LASIX) injection 40 mg   NIFEdipine (PROCARDIA) 10 MG capsule    Roney Mans M: cabinet override   ketorolac (TORADOL) 10 MG tablet    Sig: Take 1 tablet (10 mg total) by mouth every 6 (six) hours as needed.    Dispense:  20 tablet    Refill:  0   gabapentin (NEURONTIN) 300 MG capsule    Sig: Take 1 capsule (300 mg total) by mouth 3 (three) times daily.    Dispense:  30 capsule    Refill:  0   furosemide (LASIX) 20 MG tablet    Sig: Take 1 tablet (20 mg total) by mouth 2 (two) times daily for 5 days.    Dispense:  10 tablet    Refill:  0   oxyCODONE (OXY IR/ROXICODONE) 5 MG immediate release tablet    Sig: Take 1 tablet (5 mg total) by mouth every 4 (four) hours as needed for severe pain.    Dispense:  15 tablet    Refill:  0   furosemide (LASIX) 20 MG tablet    Sig: Take 1 tablet (20 mg total) by mouth 2 (two) times daily for 5 days.    Dispense:  10 tablet    Refill:  0   NIFEdipine (PROCARDIA-XL/NIFEDICAL-XL) 24 hr tablet 30 mg   Given gabapentin + toradol for pain which brought the incisional pain down considerably.   Having severe range pressures so procardia protocol put in, RN gave it twice before pressures stopped being severe range. Continued to be elevated. CXR normal (unlikely to be PE given lovenox prophylaxis). Labs normal for postpartum state.   Reviewed labs and presentation with Dr. Despina Hidden who said she could be discharged with better pain and BP management. Needs close follow up/BP check on Monday. Sent prescriptions for short courses of oral toradol, gapapentin TID, oxycodone, another 5 days of lasix and procardia daily x30  days. Procardia XL 30mg  also given prior to discharge. Patient given strict preeclampsia precautions, and RN appt scheduled for Monday at [facility].  ASSESSMENT   1. Postpartum hypertension   2. Incisional pain   3. Status post cesarean delivery    PLAN  Discharge home in stable condition with return precautions     Allergies as of 07/02/2023       Reactions   Penicillins Itching, Swelling   Patient tolerates: amoxicillin, cefazolin, and cephalexin Has patient had a PCN reaction causing immediate rash, facial/tongue/throat swelling, SOB or lightheadedness with hypotension: No Has patient had a PCN reaction causing severe rash involving mucus membranes or skin necrosis: No Has patient had a PCN reaction that required hospitalization No Has patient had a PCN reaction occurring within the last 10 years: No If all of the above answers are "NO", then may proceed with Cephalosporin use.   Pork-derived Products Other (See Comments)   Religious reasons         Medication List     STOP taking these medications    ibuprofen 600 MG tablet Commonly known as: ADVIL       TAKE these medications    Acetaminophen Extra Strength 500 MG Tabs Take 2 tablets (1,000 mg total) by mouth every 8 (eight) hours as needed for mild pain.  albuterol 108 (90 Base) MCG/ACT inhaler Commonly known as: VENTOLIN HFA Inhale 2 puffs into the lungs every 6 (six) hours as needed for wheezing or shortness of breath.   cetirizine 10 MG tablet Commonly known as: ZyrTEC Allergy Take 1 tablet (10 mg total) by mouth daily.   enoxaparin 60 MG/0.6ML injection Commonly known as: LOVENOX Inject 0.6 mLs (60 mg total) into the skin daily for 42 doses.   fluticasone 50 MCG/ACT nasal spray Commonly known as: FLONASE Place 1 spray into both nostrils daily.   furosemide 20 MG tablet Commonly known as: Lasix Take 1 tablet (20 mg total) by mouth 2 (two) times daily for 5 days. What changed: You were already taking a  medication with the same name, and this prescription was added. Make sure you understand how and when to take each.   furosemide 20 MG tablet Commonly known as: LASIX Take 1 tablet (20 mg total) by mouth 2 (two) times daily for 5 days. Start taking on: July 03, 2023 What changed: Another medication with the same name was added. Make sure you understand how and when to take each.   gabapentin 300 MG capsule Commonly known as: Neurontin Take 1 capsule (300 mg total) by mouth 3 (three) times daily.   hydrocortisone 2.5 % rectal cream Commonly known as: ANUSOL-HC Place rectally 2 (two) times daily.   ketorolac 10 MG tablet Commonly known as: TORADOL Take 1 tablet (10 mg total) by mouth every 6 (six) hours as needed.   oxyCODONE 5 MG immediate release tablet Commonly known as: Oxy IR/ROXICODONE Take 1 tablet (5 mg total) by mouth every 4 (four) hours as needed for severe pain.   polyethylene glycol 17 g packet Commonly known as: MIRALAX / GLYCOLAX Take 17 g by mouth daily.   prenatal vitamin w/FE, FA 27-1 MG Tabs tablet Take 1 tablet by mouth daily at 12 noon.   promethazine 25 MG tablet Commonly known as: PHENERGAN Take 1 tablet (25 mg total) by mouth every 6 (six) hours as needed for nausea.   Senexon-S 8.6-50 MG tablet Generic drug: senna-docusate Take 2 tablets by mouth daily.       Edd Arbour, CNM, MSN, IBCLC Certified Nurse Midwife, [facility]

## 2023-07-03 MED ORDER — FUROSEMIDE 20 MG PO TABS: 20.0000 mg | ORAL_TABLET | Freq: Two times a day (BID) | ORAL | 0 refills | Status: AC

## 2023-07-05 ENCOUNTER — Encounter (HOSPITAL_COMMUNITY)
Admission: RE | Admit: 2023-07-05 | Discharge: 2023-07-05 | Disposition: A | Discharge status: Home / Self Care | Payer: MEDICAID | Source: Ambulatory Visit | Attending: Family Medicine | Admitting: Family Medicine

## 2023-07-05 ENCOUNTER — Ambulatory Visit: Payer: Self-pay

## 2023-07-06 ENCOUNTER — Inpatient Hospital Stay (HOSPITAL_COMMUNITY): Admission: AD | Admit: 2023-07-06 | Payer: MEDICAID | Source: Home / Self Care | Admitting: Family Medicine

## 2023-07-20 ENCOUNTER — Telehealth (HOSPITAL_COMMUNITY): Payer: Self-pay | Admitting: *Deleted

## 2023-07-20 NOTE — Telephone Encounter (Signed)
Attempted hospital discharge follow-up call. Left message for patient to return RN call with any questions or concerns. Deforest Hoyles, RN, 07/20/23, [phone]

## 2023-08-09 ENCOUNTER — Encounter: Payer: Self-pay | Admitting: Obstetrics and Gynecology

## 2023-08-09 ENCOUNTER — Other Ambulatory Visit: Payer: Self-pay

## 2023-08-09 ENCOUNTER — Ambulatory Visit: Payer: MEDICAID | Admitting: Obstetrics and Gynecology

## 2023-08-09 VITALS — BP 127/87 | HR 65 | Ht 66.0 in | Wt 297.8 lb

## 2023-08-09 DIAGNOSIS — Z98891 History of uterine scar from previous surgery: Secondary | ICD-10-CM

## 2023-08-09 DIAGNOSIS — O165 Unspecified maternal hypertension, complicating the puerperium: Secondary | ICD-10-CM

## 2023-08-09 DIAGNOSIS — Z3041 Encounter for surveillance of contraceptive pills: Secondary | ICD-10-CM | POA: Diagnosis not present

## 2023-08-09 MED ORDER — SLYND 4 MG PO TABS
1.0000 | ORAL_TABLET | Freq: Every day | ORAL | 4 refills | Status: AC
Start: 2023-08-09 — End: ?

## 2023-08-09 MED ORDER — IBUPROFEN 800 MG PO TABS
800.0000 mg | ORAL_TABLET | Freq: Three times a day (TID) | ORAL | 1 refills | Status: AC | PRN
Start: 2023-08-09 — End: ?

## 2023-08-09 MED ORDER — SENNOSIDES-DOCUSATE SODIUM 8.6-50 MG PO TABS: 2.0000 | ORAL_TABLET | Freq: Every day | ORAL | 0 refills | Status: AC

## 2023-08-09 MED ORDER — NIFEDIPINE ER OSMOTIC RELEASE 30 MG PO TB24
30.0000 mg | ORAL_TABLET | Freq: Every day | ORAL | 0 refills | Status: AC
Start: 2023-08-09 — End: ?

## 2023-08-09 NOTE — Progress Notes (Signed)
Post Partum Visit Note  Anne Richardson is a 37 y.o. [id number] female who presents for a postpartum visit. She is 6 weeks postpartum following a repeat cesarean section.  I have fully reviewed the prenatal and intrapartum course. The delivery was at 38/0 gestational weeks.  Anesthesia: spinal. Postpartum course has been good. Baby is doing well. Baby is feeding by bottle - [employer] Good Start. Bleeding no bleeding. Bowel function is  Constipated . Bladder function is normal. Patient is not sexually active. Contraception method is none. Postpartum depression screening: negative.   The pregnancy intention screening data noted above was reviewed. Potential methods of contraception were discussed. The patient elected to proceed with POPs   Edinburgh Postnatal Depression Scale - 08/09/23 1136       Edinburgh Postnatal Depression Scale:  In the Past 7 Days   I have been able to laugh and see the funny side of things. 0    I have looked forward with enjoyment to things. 0    I have blamed myself unnecessarily when things went wrong. 0    I have been anxious or worried for no good reason. 1    I have felt scared or panicky for no good reason. 0    Things have been getting on top of me. 0    I have been so unhappy that I have had difficulty sleeping. 0    I have felt sad or miserable. 0    I have been so unhappy that I have been crying. 0    The thought of harming myself has occurred to me. 0    Edinburgh Postnatal Depression Scale Total 1             Health Maintenance Due  Topic Date Due   COVID-19 Vaccine (1 - 2023-24 season) Never done   INFLUENZA VACCINE  07/22/2023    The following portions of the patient's history were reviewed and updated as appropriate: allergies, current medications, past family history, past medical history, past social history, past surgical history, and problem list.  Review of Systems Pertinent items are noted in HPI.  Objective:  BP 127/87    Pulse 65   Ht 5\' 6"  (1.676 m)   Wt 297 lb 12.8 oz (135.1 kg)   Breastfeeding No   BMI 48.07 kg/m    General:  alert and cooperative   Breasts:  not indicated  Lungs: Normal effort  Heart:  Normal rate  Abdomen: Soft, non tender    Wound well approximated incision  GU exam:  not indicated       Assessment:   1. Status post cesarean delivery Incision healing well, given lovenox after delivery for hx PE  2. Postpartum hypertension Initial BP elevated, has not had meds in two days. Repeat BP normal Hold meds for today and follow up BP check this week Discussed when to follow up if severe range  3. Encounter for surveillance of contraceptive pills Trial Pops today, discussed initiation, back up method, r/b - Drospirenone (SLYND) 4 MG TABS; Take 1 tablet (4 mg total) by mouth daily.  Dispense: 84 tablet; Refill: 4     Plan:   Essential components of care per ACOG recommendations:  1.  Mood and well being: Patient with negative depression screening today. Reviewed local resources for support.  - Patient tobacco use? No.   - hx of drug use? No.    2. Infant care and feeding:  -Patient currently breastmilk feeding? No.  -  Social determinants of health (SDOH) reviewed in EPIC. No concerns  3. Sexuality, contraception and birth spacing - Patient does not want a pregnancy in the next year.  Desired family size is unsure children.  - Reviewed reproductive life planning. Reviewed contraceptive methods based on pt preferences and effectiveness.  Patient desired Oral Contraceptive today.   - Discussed birth spacing of 18 months  4. Sleep and fatigue -Encouraged family/partner/community support of 4 hrs of uninterrupted sleep to help with mood and fatigue  5. Physical Recovery  - Discussed patients delivery and complications. She describes her labor as good. - Patient had a C-section, no problems at delivery. Patient had a  none  laceration. Perineal healing reviewed. Patient  expressed understanding - Patient has urinary incontinence? No. - Patient is safe to resume physical and sexual activity  6.  Health Maintenance - HM due items addressed No -   - Last pap smear No results found for: "DIAGPAP" Pap smear not done at today's visit.  -Breast Cancer screening indicated? No.   7. Chronic Disease/Pregnancy Condition follow up: Hypertension  - PCP follow up Future Appointments  Date Time Provider Department Center  08/11/2023 10:00 AM [facility] NURSE [facility]    Albertine Grates, FNP [facility] [employer], [facility]

## 2023-08-09 NOTE — Progress Notes (Signed)
Pt request Birth Control Pills

## 2023-08-11 ENCOUNTER — Ambulatory Visit: Payer: MEDICAID

## 2024-01-17 ENCOUNTER — Other Ambulatory Visit: Payer: Self-pay

## 2024-01-17 ENCOUNTER — Emergency Department (HOSPITAL_COMMUNITY)
Admission: EM | Admit: 2024-01-17 | Discharge: 2024-01-17 | Disposition: A | Discharge status: Home / Self Care | Payer: MEDICAID | Attending: Emergency Medicine | Admitting: Emergency Medicine

## 2024-01-17 ENCOUNTER — Encounter (HOSPITAL_COMMUNITY): Payer: Self-pay

## 2024-01-17 DIAGNOSIS — J029 Acute pharyngitis, unspecified: Secondary | ICD-10-CM

## 2024-01-17 DIAGNOSIS — Z7901 Long term (current) use of anticoagulants: Secondary | ICD-10-CM | POA: Diagnosis not present

## 2024-01-17 DIAGNOSIS — Z20822 Contact with and (suspected) exposure to covid-19: Secondary | ICD-10-CM | POA: Insufficient documentation

## 2024-01-17 DIAGNOSIS — Z79899 Other long term (current) drug therapy: Secondary | ICD-10-CM | POA: Insufficient documentation

## 2024-01-17 LAB — RESP PANEL BY RT-PCR (RSV, FLU A&B, COVID)  RVPGX2
Influenza A by PCR: NEGATIVE
Influenza B by PCR: NEGATIVE
Resp Syncytial Virus by PCR: NEGATIVE
SARS Coronavirus 2 by RT PCR: NEGATIVE

## 2024-01-17 LAB — GROUP A STREP BY PCR: Group A Strep by PCR: NOT DETECTED

## 2024-01-17 NOTE — ED Provider Notes (Signed)
[place]  EMERGENCY DEPARTMENT AT [facility] Provider Note   CSN: [id number] Arrival date & time: 01/17/24  1037     History  Chief Complaint  Patient presents with   Sore Throat    Anne Richardson is a 38 y.o. female, no pertinent past history, who presents to the ED secondary to sore throat, right ear pain, and headache, this been going on for the last day.  She states that her child has the flu as well.  She states that she is having some nausea and bodyaches.  But denies any shortness of breath, or chest pain.      Home Medications Prior to Admission medications   Medication Sig Start Date End Date Taking? Authorizing Provider  acetaminophen (TYLENOL) 500 MG tablet Take 2 tablets (1,000 mg total) by mouth every 8 (eight) hours as needed for mild pain. 06/28/23   Lennart Pall, MD  albuterol (VENTOLIN HFA) 108 (90 Base) MCG/ACT inhaler Inhale 2 puffs into the lungs every 6 (six) hours as needed for wheezing or shortness of breath. Patient not taking: Reported on 06/14/2023 12/31/22   Clio Bing, MD  cetirizine (ZYRTEC ALLERGY) 10 MG tablet Take 1 tablet (10 mg total) by mouth daily. 04/21/23   Federico Flake, MD  Drospirenone (SLYND) 4 MG TABS Take 1 tablet (4 mg total) by mouth daily. 08/09/23   Sue Lush, FNP  enoxaparin (LOVENOX) 60 MG/0.6ML injection Inject 0.6 mLs (60 mg total) into the skin daily for 42 doses. 06/28/23 08/09/23  Lennart Pall, MD  fluticasone (FLONASE) 50 MCG/ACT nasal spray Place 1 spray into both nostrils daily. 04/21/23   Federico Flake, MD  furosemide (LASIX) 20 MG tablet Take 1 tablet (20 mg total) by mouth 2 (two) times daily for 5 days. 07/03/23 07/08/23  Bernerd Limbo, CNM  furosemide (LASIX) 20 MG tablet Take 1 tablet (20 mg total) by mouth 2 (two) times daily for 5 days. 07/03/23 07/08/23  Bernerd Limbo, CNM  gabapentin (NEURONTIN) 300 MG capsule Take 1 capsule (300 mg total) by mouth 3 (three)  times daily. 07/02/23   Bernerd Limbo, CNM  hydrocortisone (ANUSOL-HC) 2.5 % rectal cream Place rectally 2 (two) times daily. 06/25/23   Adam Phenix, MD  ibuprofen (ADVIL) 800 MG tablet Take 1 tablet (800 mg total) by mouth every 8 (eight) hours as needed. 08/09/23   Sue Lush, FNP  NIFEdipine (PROCARDIA-XL/NIFEDICAL-XL) 30 MG 24 hr tablet Take 1 tablet (30 mg total) by mouth daily. 08/09/23   Sue Lush, FNP  polyethylene glycol (MIRALAX / GLYCOLAX) 17 g packet Take 17 g by mouth daily. Patient not taking: Reported on 06/14/2023 05/30/23   Hurshel Party, CNM  prenatal vitamin w/FE, FA (PRENATAL 1 + 1) 27-1 MG TABS tablet Take 1 tablet by mouth daily at 12 noon. 04/21/23   Federico Flake, MD  promethazine (PHENERGAN) 25 MG tablet Take 1 tablet (25 mg total) by mouth every 6 (six) hours as needed for nausea. Patient not taking: Reported on 08/09/2023 04/21/23   Federico Flake, MD  senna-docusate (SENOKOT-S) 8.6-50 MG tablet Take 2 tablets by mouth daily. 08/09/23   Sue Lush, FNP      Allergies    Penicillins and Pork-derived products    Review of Systems   Review of Systems  Constitutional:  Negative for fever.  HENT:  Positive for ear pain.     Physical Exam Updated Vital Signs BP Marland Kitchen)  156/97 (BP Location: Left Arm)   Pulse 87   Temp 99.1 F (37.3 C) (Oral)   Resp 18   Ht 5\' 6"  (1.676 m)   Wt 117.9 kg   SpO2 100%   BMI 41.97 kg/m  Physical Exam Vitals and nursing note reviewed.  Constitutional:      General: She is not in acute distress.    Appearance: She is well-developed.  HENT:     Head: Normocephalic and atraumatic.     Right Ear: Tympanic membrane normal.     Left Ear: Tympanic membrane normal.     Mouth/Throat:     Mouth: Mucous membranes are moist.     Pharynx: Posterior oropharyngeal erythema present. No pharyngeal swelling or oropharyngeal exudate.  Eyes:     Conjunctiva/sclera: Conjunctivae normal.  Cardiovascular:      Rate and Rhythm: Normal rate and regular rhythm.     Heart sounds: No murmur heard. Pulmonary:     Effort: Pulmonary effort is normal. No respiratory distress.     Breath sounds: Normal breath sounds.  Abdominal:     Palpations: Abdomen is soft.     Tenderness: There is no abdominal tenderness.  Musculoskeletal:        General: No swelling.     Cervical back: Neck supple.  Skin:    General: Skin is warm and dry.     Capillary Refill: Capillary refill takes less than 2 seconds.  Neurological:     Mental Status: She is alert.  Psychiatric:        Mood and Affect: Mood normal.     ED Results / Procedures / Treatments   Labs (all labs ordered are listed, but only abnormal results are displayed) Labs Reviewed  RESP PANEL BY RT-PCR (RSV, FLU A&B, COVID)  [id number]  GROUP A STREP BY PCR    EKG None  Radiology No results found.  Procedures Procedures    Medications Ordered in ED Medications - No data to display  ED Course/ Medical Decision Making/ A&P                                 Medical Decision Making Patient is a 38 year old female, here for sore throat, ear pain, has been going on for the last day.  Family members sick with the flu.  She went to get checked out.  TMs are pearly, no evidence of abnormality.  No sponginess of the mastoid process.  Overall well-appearing.  Will obtain COVID/flu, for further evaluation  Amount and/or Complexity of Data Reviewed Labs:     Details: COVID/flu negative Discussion of management or test interpretation with external provider(s): Patient's labs are unremarkable, COVID/flu negative.  Overall well-appearing.  We will have her discharge, and follow-up with PCP, believe that she likely has a viral illness, that has just not tested positive yet.  We discussed follow-up with PCP, and return precautions, and she voiced understanding.  Discharged home    Final Clinical Impression(s) / ED Diagnoses Final diagnoses:  Viral  pharyngitis    Rx / DC Orders ED Discharge Orders     None         [name], [place]ley [place], PA 01/17/24 1234    Melene Plan, DO 01/17/24 1331

## 2024-01-17 NOTE — Discharge Instructions (Signed)
Please follow-up with your primary care doctor, return if you feel like your symptoms are worsening.  Take Tylenol, ibuprofen for your symptoms.

## 2024-01-17 NOTE — ED Triage Notes (Signed)
Patient reports cough, congestion, sore throat, nausea, and body aches. Reports her child has the flu.
